# Patient Record
Sex: Female | Born: 1980 | Race: White | Hispanic: No | Smoking: Former smoker
Health system: Southern US, Community
[De-identification: ages and names within clinical notes are randomized; demographics above are authoritative.]

## PROBLEM LIST (undated history)

## (undated) ENCOUNTER — Inpatient Hospital Stay (HOSPITAL_COMMUNITY): Payer: Self-pay

## (undated) DIAGNOSIS — Z87442 Personal history of urinary calculi: Secondary | ICD-10-CM

## (undated) DIAGNOSIS — F909 Attention-deficit hyperactivity disorder, unspecified type: Secondary | ICD-10-CM

## (undated) DIAGNOSIS — M1990 Unspecified osteoarthritis, unspecified site: Secondary | ICD-10-CM

## (undated) DIAGNOSIS — I10 Essential (primary) hypertension: Secondary | ICD-10-CM

## (undated) DIAGNOSIS — F32A Depression, unspecified: Secondary | ICD-10-CM

## (undated) DIAGNOSIS — O009 Unspecified ectopic pregnancy without intrauterine pregnancy: Secondary | ICD-10-CM

## (undated) HISTORY — DX: Unspecified ectopic pregnancy without intrauterine pregnancy: O00.90

## (undated) HISTORY — PX: WISDOM TOOTH EXTRACTION: SHX21

---

## 2003-07-07 ENCOUNTER — Other Ambulatory Visit: Admission: RE | Admit: 2003-07-07 | Discharge: 2003-07-07 | Payer: Self-pay | Admitting: Obstetrics and Gynecology

## 2003-09-30 ENCOUNTER — Emergency Department (HOSPITAL_COMMUNITY): Admission: EM | Admit: 2003-09-30 | Discharge: 2003-09-30 | Payer: Self-pay | Admitting: Family Medicine

## 2005-01-18 ENCOUNTER — Emergency Department (HOSPITAL_COMMUNITY): Admission: EM | Admit: 2005-01-18 | Discharge: 2005-01-18 | Payer: Self-pay | Admitting: Emergency Medicine

## 2007-11-25 ENCOUNTER — Other Ambulatory Visit: Admission: RE | Admit: 2007-11-25 | Discharge: 2007-11-25 | Payer: Self-pay | Admitting: Obstetrics & Gynecology

## 2011-06-20 ENCOUNTER — Ambulatory Visit (INDEPENDENT_AMBULATORY_CARE_PROVIDER_SITE_OTHER): Payer: BC Managed Care – PPO

## 2011-06-20 DIAGNOSIS — R030 Elevated blood-pressure reading, without diagnosis of hypertension: Secondary | ICD-10-CM

## 2011-06-20 DIAGNOSIS — R319 Hematuria, unspecified: Secondary | ICD-10-CM

## 2011-06-20 DIAGNOSIS — R1032 Left lower quadrant pain: Secondary | ICD-10-CM

## 2011-06-20 DIAGNOSIS — R1031 Right lower quadrant pain: Secondary | ICD-10-CM

## 2011-06-25 ENCOUNTER — Ambulatory Visit (INDEPENDENT_AMBULATORY_CARE_PROVIDER_SITE_OTHER): Payer: BC Managed Care – PPO

## 2011-06-25 DIAGNOSIS — R319 Hematuria, unspecified: Secondary | ICD-10-CM

## 2011-06-25 DIAGNOSIS — R030 Elevated blood-pressure reading, without diagnosis of hypertension: Secondary | ICD-10-CM

## 2011-06-25 DIAGNOSIS — B373 Candidiasis of vulva and vagina: Secondary | ICD-10-CM

## 2014-11-19 ENCOUNTER — Encounter (HOSPITAL_COMMUNITY): Payer: Self-pay

## 2014-11-19 ENCOUNTER — Inpatient Hospital Stay (HOSPITAL_COMMUNITY)
Admission: AD | Admit: 2014-11-19 | Discharge: 2014-11-19 | Disposition: A | Discharge status: Home / Self Care | Payer: BLUE CROSS/BLUE SHIELD | Source: Ambulatory Visit | Attending: Obstetrics and Gynecology | Admitting: Obstetrics and Gynecology

## 2014-11-19 DIAGNOSIS — N9489 Other specified conditions associated with female genital organs and menstrual cycle: Secondary | ICD-10-CM | POA: Insufficient documentation

## 2014-11-19 DIAGNOSIS — R1031 Right lower quadrant pain: Secondary | ICD-10-CM | POA: Diagnosis not present

## 2014-11-19 DIAGNOSIS — R1030 Lower abdominal pain, unspecified: Secondary | ICD-10-CM | POA: Diagnosis present

## 2014-11-19 HISTORY — DX: Attention-deficit hyperactivity disorder, unspecified type: F90.9

## 2014-11-19 LAB — CBC WITH DIFFERENTIAL/PLATELET
BASOS ABS: 0.1 10*3/uL (ref 0.0–0.1)
Basophils Relative: 1 % (ref 0–1)
Eosinophils Absolute: 0.1 10*3/uL (ref 0.0–0.7)
Eosinophils Relative: 1 % (ref 0–5)
HCT: 42.3 % (ref 36.0–46.0)
Hemoglobin: 14.4 g/dL (ref 12.0–15.0)
LYMPHS ABS: 1.5 10*3/uL (ref 0.7–4.0)
LYMPHS PCT: 12 % (ref 12–46)
MCH: 31.6 pg (ref 26.0–34.0)
MCHC: 34 g/dL (ref 30.0–36.0)
MCV: 92.8 fL (ref 78.0–100.0)
MONO ABS: 0.6 10*3/uL (ref 0.1–1.0)
MONOS PCT: 5 % (ref 3–12)
NEUTROS PCT: 81 % — AB (ref 43–77)
Neutro Abs: 9.6 10*3/uL — ABNORMAL HIGH (ref 1.7–7.7)
PLATELETS: 371 10*3/uL (ref 150–400)
RBC: 4.56 MIL/uL (ref 3.87–5.11)
RDW: 12.4 % (ref 11.5–15.5)
WBC: 11.8 10*3/uL — ABNORMAL HIGH (ref 4.0–10.5)

## 2014-11-19 LAB — ABO/RH: ABO/RH(D): B POS

## 2014-11-19 LAB — CREATININE, SERUM: CREATININE: 0.6 mg/dL (ref 0.44–1.00)

## 2014-11-19 LAB — TYPE AND SCREEN
ABO/RH(D): B POS
ANTIBODY SCREEN: NEGATIVE

## 2014-11-19 LAB — HCG, QUANTITATIVE, PREGNANCY: hCG, Beta Chain, Quant, S: 7573 m[IU]/mL — ABNORMAL HIGH (ref <5)

## 2014-11-19 LAB — AST: AST: 15 U/L (ref 15–41)

## 2014-11-19 LAB — BUN: BUN: 7 mg/dL (ref 6–20)

## 2014-11-19 MED ORDER — OXYCODONE-ACETAMINOPHEN 5-325 MG PO TABS: 1.0000 | ORAL_TABLET | ORAL | Status: DC | PRN

## 2014-11-19 MED ORDER — KETOROLAC TROMETHAMINE 30 MG/ML IJ SOLN
30.0000 mg | Freq: Once | INTRAMUSCULAR | Status: AC
  Administered 2014-11-19: 30 mg via INTRAMUSCULAR
  Filled 2014-11-19: qty 1

## 2014-11-19 MED ORDER — METHOTREXATE INJECTION FOR WOMEN'S HOSPITAL
50.0000 mg/m2 | Freq: Once | INTRAMUSCULAR | Status: AC
Start: 2014-11-19 — End: 2014-11-19
  Administered 2014-11-19: 95 mg via INTRAMUSCULAR
  Filled 2014-11-19: qty 1.9

## 2014-11-19 NOTE — Discharge Instructions (Signed)
Methotrexate injection What is this medicine? METHOTREXATE (METH oh TREX ate) is a chemotherapy drug. This medicine affects cells that are rapidly growing, such as cancer cells and cells in your mouth and stomach. It is used to treat many cancers and other medical conditions. It is used for leukemias, lymphomas, breast cancer, lung cancer, head and neck cancers, and other cancers. This medicine also works on the immune system and is commonly used to treat psoriasis and rheumatoid arthritis. This medicine may be used for other purposes; ask your health care provider or pharmacist if you have questions. What should I tell my health care provider before I take this medicine? They need to know if you have any of these conditions: -if you frequently drink alcohol containing drinks -infection (especially a virus infection such as chickenpox, cold sores, or herpes) -immune system problems -kidney disease -liver disease -low blood counts, like platelets, red bloods, or white blood cells -lung disease -recent or ongoing radiation therapy -an unusual or allergic reaction to methotrexate, benzyl alcohol, other medicines, foods, dyes, or preservatives -pregnant or trying to get pregnant -breast-feeding How should I use this medicine? This drug is given as an injection into a muscle or into a vein. It may also be given into the spinal fluid. It is administered in a hospital or clinic by a specially trained health care professional. Talk to your pediatrician regarding the use of this medicine in children. While this drug may be prescribed for selected conditions, precautions do apply. Overdosage: If you think you have taken too much of this medicine contact a poison control center or emergency room at once. NOTE: This medicine is only for you. Do not share this medicine with others. What if I miss a dose? It is important not to miss your dose. Call your doctor or health care professional if you are unable  to keep an appointment. What may interact with this medicine? -antibiotics and other medicines for infections -aspirin and aspirin-like medicines including bismuth subsalicylate (Pepto-Bismol) -cisplatin -dapsone -folic acid in supplements or vitamins -mercaptopurine -NSAIDs, medicines for pain and inflammation, like ibuprofen or naproxen -pemetrexed -phenylbutazone -phenytoin -probenecid -pyrimethamine -theophylline -trimetrexate -vaccines This list may not describe all possible interactions. Give your health care provider a list of all the medicines, herbs, non-prescription drugs, or dietary supplements you use. Also tell them if you smoke, drink alcohol, or use illegal drugs. Some items may interact with your medicine. What should I watch for while using this medicine? Visit your doctor for checks on your progress. You will need to have regular blood checks during your treatment to monitor your blood, liver function, and kidney function. This drug may make you feel generally unwell. This is not uncommon, as chemotherapy can affect healthy cells as well as cancer cells. Report any side effects. Continue your course of treatment even though you feel ill unless your doctor tells you to stop. In some cases, you may be given additional medicines to help with side effects. Follow all directions for their use. Call your doctor or health care professional for advice if you get a fever, chills or sore throat, or other symptoms of a cold or flu. Do not treat yourself. This drug decreases your body's ability to fight infections. Try to avoid being around people who are sick. This medicine may increase your risk to bruise or bleed. Call your doctor or health care professional if you notice any unusual bleeding. Be careful brushing and flossing your teeth or using a toothpick because   you may get an infection or bleed more easily. If you have any dental work done, tell your dentist you are receiving  this medicine. Avoid taking products that contain aspirin, acetaminophen, ibuprofen, naproxen, or ketoprofen unless instructed by your doctor. These medicines may hide a fever. This medicine can make you more sensitive to the sun. Keep out of the sun. If you cannot avoid being in the sun, wear protective clothing and use sunscreen. Do not use sun lamps or tanning beds/booths. Do not treat diarrhea with over the counter products. Contact your doctor if you have diarrhea. To protect your kidneys, drink water or other fluids as directed while you are taking this medicine. Do not drink alcohol-containing drinks while taking this medicine. Both alcohol and the medicine may cause damage to your liver. Men and women must use effective birth control while they are taking this medicine. Do not become pregnant while taking this medicine. Women must continue using effective birth control for 1 full menstrual cycle after stopping this medicine. Tell your doctor right away if you think that you or your partner might be pregnant. There is a potential for serious side effects to an unborn child. Talk to your health care professional or pharmacist for more information. Do not breast-feed an infant while taking this medicine. Men must continue effective birth control for 3 months after stopping this medicine. What side effects may I notice from receiving this medicine? Side effects that you should report to your doctor or health care professional as soon as possible: -allergic reactions like skin rash, itching or hives, swelling of the face, lips, or tongue -low blood counts - this medicine may decrease the number of white blood cells, red blood cells and platelets. You may be at increased risk for infections and bleeding. -signs of infection - fever or chills, cough, sore throat, pain or difficulty passing urine -signs of decreased platelets or bleeding - bruising, pinpoint red spots on the skin, black, tarry stools,  blood in the urine -signs of decreased red blood cells - unusually weak or tired, fainting spells, lightheadedness -breathing problems, like a dry cough -changes in vision -confusion, not alert -diarrhea -mouth or throat sores or ulcers -problems with balance, talking, walking -redness, blistering, peeling or loosening of the skin, including inside the mouth -seizures -trouble passing urine or change in the amount of urine -vomiting -yellowing of the eyes or skin Side effects that usually do not require medical attention (report to your doctor or health care professional if they continue or are bothersome): -change in skin color -eye irritation -hair loss -headache -loss of appetite -nausea -stomach upset This list may not describe all possible side effects. Call your doctor for medical advice about side effects. You may report side effects to FDA at 1-800-FDA-1088. Where should I keep my medicine? This drug is given in a hospital or clinic and will not be stored at home. NOTE: This sheet is a summary. It may not cover all possible information. If you have questions about this medicine, talk to your doctor, pharmacist, or health care provider.  2015, Elsevier/Gold Standard. (2007-12-11 11:13:24)  

## 2014-11-19 NOTE — H&P (Signed)
NAMCherene Altes:  [name], [name]             ACCOUNT NO.:  [id number]  MEDICAL RECORD NO.:  [id number]  LOCATION:  [id number]                          FACILITY:  [facility]  PHYSICIAN:  Lenoard Adenichard J. Jakyiah Briones, MelissaD.DATE OF BIRTH:  1980-12-24  DATE OF ADMISSION:  11/19/2014 DATE OF DISCHARGE:                             HISTORY & PHYSICAL   CHIEF COMPLAINT:  Lower abdominal pain.  HISTORY OF PRESENT ILLNESS:  She is a 34 year old white female, G1, P0, who presented to the office today with acute onset of bleeding and mid- to-right lower quadrant pain for evaluation.  It was noted that her bleeding was minimal.  She had an ultrasound suggesting an empty endometrial cavity and a 2 cm saccular structure on the right adnexa. No free fluid was seen.  Otherwise, normal left adnexa was noted.  She presents now to the emergency room for additional evaluation and triage.  PAST MEDICAL HISTORY:  Remarkable for wisdom tooth removal.  ALLERGIES:  She has no known drug allergies.  MEDICATIONS:  Prenatal vitamins, Phenergan as needed, Zofran as needed.  SOCIAL HISTORY:  She is a nonsmoker, nondrinker.  She denies domestic or physical violence.  Social history, noncontributory.  FAMILY HISTORY:  Noncontributory.  PHYSICAL EXAMINATION:  GENERAL:  She is a well-developed, well-nourished white female. VITAL SIGNS:  Initial blood pressure 155/95, pulse of 89, and respiration rate of 20. HEENT:  Normal. NECK:  Supple.  Full range of motion. LUNGS:  Clear. HEART:  Regular rate and rhythm. ABDOMEN:  Soft, nontender.  No rebound.  No guarding. PELVIC:  Reveals minimal bleeding.  No cervical motion tenderness. EXTREMITIES:  There are no cords. NEUROLOGIC:  Nonfocal. SKIN:  Intact.  LABORATORY VALUES:  Include a CBC which is remarkable for white blood cell count of 11.8, hemoglobin of 14.4, and a normal complete metabolic profile.  She had a quantitative HCG of 7573.  IMPRESSION:  Mid right lower quadrant pain with 2 cm  right adnexal mass and quantitative HCG of 7573.  I have expressed to the patient and her husband the possibility of a miscarriage that could have occurred with continued elevated quant before dropping; however, in lieu of the fact of these suspicious findings on ultrasound in conjunction with this elevated quantitative HCG, I have recommended methotrexate treatment at this time.  PLAN:  To proceed with methotrexate IM, bleeding precautions given.  She is to follow up in the office for serial HCG followup.  Consents were done.  Percocet prescription given.     Lenoard Adenichard J. Geraldyn Shain, MelissaD.     RJT/MEDQ  D:  11/19/2014  T:  11/19/2014  Job:  [id number]

## 2014-11-19 NOTE — MAU Note (Signed)
Pt sent from MD office for r ectopic. Pain on r side.

## 2014-11-19 NOTE — Progress Notes (Signed)
Returned page. Will give methotrexate and come see pt. Requested that physician put in methotrexate order.

## 2014-11-19 NOTE — Progress Notes (Signed)
Paged to request pain medication for pt pain 9/10

## 2014-11-19 NOTE — Progress Notes (Signed)
Paged to notify of hcg

## 2014-12-31 ENCOUNTER — Other Ambulatory Visit (HOSPITAL_COMMUNITY): Payer: Self-pay | Admitting: Obstetrics and Gynecology

## 2015-01-20 ENCOUNTER — Other Ambulatory Visit (HOSPITAL_COMMUNITY): Payer: Self-pay | Admitting: Obstetrics and Gynecology

## 2015-01-20 DIAGNOSIS — O009 Unspecified ectopic pregnancy without intrauterine pregnancy: Secondary | ICD-10-CM

## 2015-01-27 ENCOUNTER — Ambulatory Visit (HOSPITAL_COMMUNITY)
Admission: RE | Admit: 2015-01-27 | Discharge: 2015-01-27 | Disposition: A | Discharge status: Home / Self Care | Payer: BLUE CROSS/BLUE SHIELD | Source: Ambulatory Visit | Attending: Obstetrics and Gynecology | Admitting: Obstetrics and Gynecology

## 2015-01-27 ENCOUNTER — Encounter (HOSPITAL_COMMUNITY): Payer: Self-pay

## 2015-01-27 DIAGNOSIS — O009 Unspecified ectopic pregnancy without intrauterine pregnancy: Secondary | ICD-10-CM

## 2015-01-27 DIAGNOSIS — Z8759 Personal history of other complications of pregnancy, childbirth and the puerperium: Secondary | ICD-10-CM | POA: Insufficient documentation

## 2015-01-27 MED ORDER — IOHEXOL 300 MG/ML  SOLN
30.0000 mL | Freq: Once | INTRAMUSCULAR | Status: DC | PRN
  Administered 2015-01-27: 30 mL
  Filled 2015-01-27: qty 30

## 2015-04-25 ENCOUNTER — Ambulatory Visit (INDEPENDENT_AMBULATORY_CARE_PROVIDER_SITE_OTHER): Payer: BLUE CROSS/BLUE SHIELD | Admitting: Physician Assistant

## 2015-04-25 VITALS — BP 120/74 | HR 57 | Temp 98.0°F | Resp 16 | Ht 64.5 in | Wt 176.8 lb

## 2015-04-25 DIAGNOSIS — R05 Cough: Secondary | ICD-10-CM

## 2015-04-25 DIAGNOSIS — J0140 Acute pansinusitis, unspecified: Secondary | ICD-10-CM

## 2015-04-25 DIAGNOSIS — R058 Other specified cough: Secondary | ICD-10-CM

## 2015-04-25 MED ORDER — GUAIFENESIN ER 1200 MG PO TB12: 1.0000 | ORAL_TABLET | Freq: Two times a day (BID) | ORAL | Status: DC | PRN

## 2015-04-25 MED ORDER — DOXYCYCLINE HYCLATE 100 MG PO CAPS: 100.0000 mg | ORAL_CAPSULE | Freq: Two times a day (BID) | ORAL | Status: AC

## 2015-04-25 MED ORDER — HYDROCOD POLST-CPM POLST ER 10-8 MG/5ML PO SUER: 5.0000 mL | Freq: Every evening | ORAL | Status: AC | PRN

## 2015-04-25 NOTE — Progress Notes (Signed)
[facility] [address], [place] [address] [phone]  Date:  04/25/2015   Name:  [name] [name]   DOB:  Jan 08, 1981   MRN:  [id number]  PCP:  No primary care provider on file.   Chief Complaint  Patient presents with  . Laryngitis    x 3 days  . Nasal Congestion    Post nasal drip  . Sore Throat  . Headache    Sinus headaches x 1 week  . Cough    Productive, Yellow Color, x 2 days, Interrupts pt's sleep     History of Present Illness:  [name] [name] is a 34 y.o. female patient who presents to [facility] with cc of sinus pressure, productive cough, and loss of voice with initial symptoms beginning 1 week ago.  Patient notes that she has had nasal congestion and sinus pressure at her forehead for the last week.  She can feel the drainage from her nose to the back of her throat.  Over the last 3 days, she has lost her voice, and has sore throat.  She also has productive cough over the last 2 days, that worsens at night.  It is a productive yellow sputum.   She hydrates very little.  She has no fever, sob, or dyspnea.  She is currently using a nasal spray.   There are no active problems to display for this patient.   Past Medical History  Diagnosis Date  . ADHD (attention deficit hyperactivity disorder)   . History of tubal ligation     Past Surgical History  Procedure Laterality Date  . Wisdom tooth extraction      Social History  Substance Use Topics  . Smoking status: Former Games developer  . Smokeless tobacco: Never Used  . Alcohol Use: No    Family History  Problem Relation Age of Onset  . Hypertension Father     Allergies  Allergen Reactions  . Toradol [Ketorolac Tromethamine] Nausea And Vomiting    Medication list has been reviewed and updated.  Current Outpatient Prescriptions on File Prior to Visit  Medication Sig Dispense Refill  . oxyCODONE-acetaminophen (ROXICET) 5-325 MG per tablet Take 1-2 tablets by mouth every 4 (four) hours as  needed for severe pain. (Patient not taking: Reported on 04/25/2015) 30 tablet 0   No current facility-administered medications on file prior to visit.    ROS ROS otehrwise unremarkble unelss listed above.   Physical Examination: BP 120/74 mmHg  Pulse 57  Temp(Src) 98 F (36.7 C) (Oral)  Resp 16  Ht 5' 4.5" (1.638 m)  Wt 176 lb 12.8 oz (80.196 kg)  BMI 29.89 kg/m2  SpO2 96%  LMP 04/11/2015 Ideal Body Weight: Weight in (lb) to have BMI = 25: 147.6  Physical Exam  Constitutional: She is oriented to person, place, and time. She appears well-developed and well-nourished. No distress.  HENT:  Head: Normocephalic and atraumatic.  Right Ear: Tympanic membrane, external ear and ear canal normal.  Left Ear: Tympanic membrane, external ear and ear canal normal.  Nose: Rhinorrhea present. No mucosal edema. Right sinus exhibits maxillary sinus tenderness and frontal sinus tenderness. Left sinus exhibits maxillary sinus tenderness and frontal sinus tenderness.  Mouth/Throat: No uvula swelling. Posterior oropharyngeal edema present. No oropharyngeal exudate or posterior oropharyngeal erythema.  Eyes: Conjunctivae and EOM are normal. Pupils are equal, round, and reactive to light.  Cardiovascular: Normal rate and regular rhythm.  Exam reveals no gallop and no friction rub.   No  murmur heard. Pulmonary/Chest: Effort normal. No respiratory distress. She has no decreased breath sounds. She has no wheezes. She has no rhonchi.  Neurological: She is alert and oriented to person, place, and time.  Skin: Skin is warm and dry. She is not diaphoretic.  Psychiatric: She has a normal mood and affect. Her behavior is normal.     Assessment and Plan: [name] [name] is a 34 y.o. female who is here today for cc of sinus pressure, productive cough, loss of voice, and sore throat over the last week without improvement.  Will treat for bacterial etiology, as well as productive cough.  Doxycycline to cover for  other sinus cavities.   Treatment plan discussed.   Subacute pansinusitis - Plan: doxycycline (VIBRAMYCIN) 100 MG capsule, Guaifenesin (MUCINEX MAXIMUM STRENGTH) 1200 MG TB12, chlorpheniramine-HYDROcodone (TUSSIONEX PENNKINETIC ER) 10-8 MG/5ML SUER  Productive cough - Plan: doxycycline (VIBRAMYCIN) 100 MG capsule, Guaifenesin (MUCINEX MAXIMUM STRENGTH) 1200 MG TB12, chlorpheniramine-HYDROcodone (TUSSIONEX PENNKINETIC ER) 10-8 MG/5ML SUER    [name] PlattStephanie Mirren Gest, PA-C [facility] 04/25/2015 6:34 PM

## 2015-04-25 NOTE — Patient Instructions (Signed)
Please hydrate well with 64 oz per day which is almost 4 regular sized water bottles.    Sinusitis, Adult Sinusitis is redness, soreness, and inflammation of the paranasal sinuses. Paranasal sinuses are air pockets within the bones of your face. They are located beneath your eyes, in the middle of your forehead, and above your eyes. In healthy paranasal sinuses, mucus is able to drain out, and air is able to circulate through them by way of your nose. However, when your paranasal sinuses are inflamed, mucus and air can become trapped. This can allow bacteria and other germs to grow and cause infection. Sinusitis can develop quickly and last only a short time (acute) or continue over a long period (chronic). Sinusitis that lasts for more than 12 weeks is considered chronic. CAUSES Causes of sinusitis include:  Allergies.  Structural abnormalities, such as displacement of the cartilage that separates your nostrils (deviated septum), which can decrease the air flow through your nose and sinuses and affect sinus drainage.  Functional abnormalities, such as when the small hairs (cilia) that line your sinuses and help remove mucus do not work properly or are not present. SIGNS AND SYMPTOMS Symptoms of acute and chronic sinusitis are the same. The primary symptoms are pain and pressure around the affected sinuses. Other symptoms include:  Upper toothache.  Earache.  Headache.  Bad breath.  Decreased sense of smell and taste.  A cough, which worsens when you are lying flat.  Fatigue.  Fever.  Thick drainage from your nose, which often is green and may contain pus (purulent).  Swelling and warmth over the affected sinuses. DIAGNOSIS Your health care provider will perform a physical exam. During your exam, your health care provider may perform any of the following to help determine if you have acute sinusitis or chronic sinusitis:  Look in your nose for signs of abnormal growths in your  nostrils (nasal polyps).  Tap over the affected sinus to check for signs of infection.  View the inside of your sinuses using an imaging device that has a light attached (endoscope). If your health care provider suspects that you have chronic sinusitis, one or more of the following tests may be recommended:  Allergy tests.  Nasal culture. A sample of mucus is taken from your nose, sent to a lab, and screened for bacteria.  Nasal cytology. A sample of mucus is taken from your nose and examined by your health care provider to determine if your sinusitis is related to an allergy. TREATMENT Most cases of acute sinusitis are related to a viral infection and will resolve on their own within 10 days. Sometimes, medicines are prescribed to help relieve symptoms of both acute and chronic sinusitis. These may include pain medicines, decongestants, nasal steroid sprays, or saline sprays. However, for sinusitis related to a bacterial infection, your health care provider will prescribe antibiotic medicines. These are medicines that will help kill the bacteria causing the infection. Rarely, sinusitis is caused by a fungal infection. In these cases, your health care provider will prescribe antifungal medicine. For some cases of chronic sinusitis, surgery is needed. Generally, these are cases in which sinusitis recurs more than 3 times per year, despite other treatments. HOME CARE INSTRUCTIONS  Drink plenty of water. Water helps thin the mucus so your sinuses can drain more easily.  Use a humidifier.  Inhale steam 3-4 times a day (for example, sit in the bathroom with the shower running).  Apply a warm, moist washcloth to your face  3-4 times a day, or as directed by your health care provider.  Use saline nasal sprays to help moisten and clean your sinuses.  Take medicines only as directed by your health care provider.  If you were prescribed either an antibiotic or antifungal medicine, finish it all  even if you start to feel better. SEEK IMMEDIATE MEDICAL CARE IF:  You have increasing pain or severe headaches.  You have nausea, vomiting, or drowsiness.  You have swelling around your face.  You have vision problems.  You have a stiff neck.  You have difficulty breathing.   This information is not intended to replace advice given to you by your health care provider. Make sure you discuss any questions you have with your health care provider.   Document Released: 06/04/2005 Document Revised: 06/25/2014 Document Reviewed: 06/19/2011 Elsevier Interactive Patient Education [employer]

## 2015-04-28 ENCOUNTER — Encounter: Payer: Self-pay | Admitting: Physician Assistant

## 2015-06-22 ENCOUNTER — Ambulatory Visit (INDEPENDENT_AMBULATORY_CARE_PROVIDER_SITE_OTHER): Payer: BLUE CROSS/BLUE SHIELD

## 2015-06-22 ENCOUNTER — Ambulatory Visit (INDEPENDENT_AMBULATORY_CARE_PROVIDER_SITE_OTHER): Payer: BLUE CROSS/BLUE SHIELD | Admitting: Physician Assistant

## 2015-06-22 VITALS — BP 122/70 | HR 109 | Temp 98.3°F | Resp 17 | Ht 65.0 in | Wt 182.0 lb

## 2015-06-22 DIAGNOSIS — M542 Cervicalgia: Secondary | ICD-10-CM

## 2015-06-22 DIAGNOSIS — F909 Attention-deficit hyperactivity disorder, unspecified type: Secondary | ICD-10-CM | POA: Insufficient documentation

## 2015-06-22 DIAGNOSIS — M25511 Pain in right shoulder: Secondary | ICD-10-CM | POA: Diagnosis not present

## 2015-06-22 DIAGNOSIS — R071 Chest pain on breathing: Secondary | ICD-10-CM | POA: Diagnosis not present

## 2015-06-22 DIAGNOSIS — R0789 Other chest pain: Secondary | ICD-10-CM

## 2015-06-22 MED ORDER — MELOXICAM 15 MG PO TABS: 15.0000 mg | ORAL_TABLET | Freq: Every day | ORAL | Status: DC

## 2015-06-22 MED ORDER — CYCLOBENZAPRINE HCL 10 MG PO TABS: 10.0000 mg | ORAL_TABLET | Freq: Three times a day (TID) | ORAL | Status: DC | PRN

## 2015-06-22 MED ORDER — HYDROCODONE-ACETAMINOPHEN 5-325 MG PO TABS: 1.0000 | ORAL_TABLET | Freq: Four times a day (QID) | ORAL | Status: DC | PRN

## 2015-06-22 NOTE — Progress Notes (Signed)
Patient ID: [name] [name], female    DOB: [birth year], 35 y.o.   MRN: [id number]  PCP: No primary care provider on file.  Subjective:   Chief Complaint  Patient presents with  . Neck Pain    fell out of bed two weeks ago   . Shoulder Pain    fell out of bed two weeks ago   . Nasal Congestion    HPI Presents for evaluation of neck, shoulder and upper chest wall pain x 2 weeks, after falling out of bed.  Thinks she was having a bad dream, fell out of bed. Husband heard her land on the floor.  Pain down the RIGHT side of the neck into the shoulder and RIGHT upper chest. Feels knots in the muscles around the shoulder blade. Gets spasm with some movements. Numbness in the tips of the index and middle fingers on the RIGHT. Ibuprofen 600-800 mg Q4-6 hours helps. Biofreeze, can't use it at work due to odor. No headache or dizziness. No dropping, loss of grasp. Has been able to work as a Photographer, doing heavy lifting, but at the end of a shift, her pain is worse. More sore in the mornings and at night. Heat doesn't seem to help. Stopped that 2 days ago.  She tried to schedule her usual monthly massage a little early, hoping that would help, and the therapist advised her to be seen.  In addition, has picked up a cold. Began yesterday. Burning in the sinuses, itchy throat and eyes, sneezing a lot. No ear pain. Maybe ears are a little stopped up. Not coughing. No fever/chills. No unexplained muscle or joint aches.     Review of Systems As above. No headache, SOB, dizziness.    Patient Active Problem List   Diagnosis Date Noted  . Attention deficit hyperactivity disorder (ADHD) 06/22/2015     Prior to Admission medications   Medication Sig Start Date End Date Taking? Authorizing Provider  amphetamine-dextroamphetamine (ADDERALL XR) 25 MG 24 hr capsule Take 25 mg by mouth every morning.   Yes Historical Provider, MD  amphetamine-dextroamphetamine  (ADDERALL XR) 30 MG 24 hr capsule Take 30 mg by mouth daily.   Yes Historical Provider, MD  Phenylephrine-DM-GG-APAP (MUCINEX FAST-MAX SEVERE COLD) 5-10-200-325 MG/10ML LIQD Take by mouth.   Yes Historical Provider, MD     Allergies  Allergen Reactions  . Toradol [Ketorolac Tromethamine] Nausea And Vomiting       Objective:  Physical Exam  Constitutional: She is oriented to person, place, and time. Vital signs are normal. She appears well-developed and well-nourished. She is active and cooperative. No distress.  BP 122/70 mmHg  Pulse 109  Temp(Src) 98.3 F (36.8 C) (Oral)  Resp 17  Ht 5\' 5"  (1.651 m)  Wt 182 lb (82.555 kg)  BMI 30.29 kg/m2  SpO2 98%  LMP 06/06/2015  HENT:  Head: Normocephalic and atraumatic.  Right Ear: Hearing normal.  Left Ear: Hearing normal.  Eyes: Conjunctivae are normal. No scleral icterus.  Neck: Normal range of motion. Neck supple. No thyromegaly present.  Cardiovascular: Normal rate, regular rhythm and normal heart sounds.   Pulses:      Radial pulses are 2+ on the right side, and 2+ on the left side.    Pulmonary/Chest: Effort normal and breath sounds normal.    Musculoskeletal:       Right shoulder: She exhibits tenderness and pain. She exhibits normal range of motion, no bony tenderness, no swelling, no deformity, no laceration and  no spasm.       Right elbow: Normal.      Right wrist: Normal.       Cervical back: She exhibits decreased range of motion, tenderness, pain and spasm. She exhibits no bony tenderness, no swelling, no edema, no deformity and no laceration.       Right upper arm: Normal.       Right forearm: Normal.       Right hand: She exhibits normal capillary refill. Normal sensation noted. Normal strength noted.  Very erect posture. Moves upper body cautiously with minimal movement of the neck and shoulders.  Lymphadenopathy:       Head (right side): No tonsillar, no preauricular, no posterior auricular and no occipital  adenopathy present.       Head (left side): No tonsillar, no preauricular, no posterior auricular and no occipital adenopathy present.    She has no cervical adenopathy.       Right: No supraclavicular adenopathy present.       Left: No supraclavicular adenopathy present.  Neurological: She is alert and oriented to person, place, and time. No sensory deficit.  Skin: Skin is warm, dry and intact. No rash noted. No cyanosis or erythema. Nails show no clubbing.  Psychiatric: She has a normal mood and affect.      C-Spine: [facility] reading (PRIMARY) by  Dr. Dareen PianoAnderson. Loss of lordosis with step offs, worst at C4-C5. STAT Overread: Reversal lordotic body curvature, a finding most likely indicative of muscle spasm. No fracture or spondylolisthesis. No appreciable arthropathic change.     Assessment & Plan:   1. Neck pain 2. Pain in joint of right shoulder 3. Anterior chest wall pain Contusion/strain, now with considerable muscle spasm. Anticipatory guidance. Encouraged re-attempt at moist heat. OK for massage. RTC if symptoms worsen/persist. - DG Cervical Spine Complete; Future - meloxicam (MOBIC) 15 MG tablet; Take 1 tablet (15 mg total) by mouth daily.  Dispense: 30 tablet; Refill: 1 - HYDROcodone-acetaminophen (NORCO) 5-325 MG tablet; Take 1 tablet by mouth every 6 (six) hours as needed.  Dispense: 30 tablet; Refill: 0 - cyclobenzaprine (FLEXERIL) 10 MG tablet; Take 1 tablet (10 mg total) by mouth 3 (three) times daily as needed for muscle spasms.  Dispense: 30 tablet; Refill: 0    Fernande Brashelle S. Debbi Strandberg, PA-C Physician Assistant-Certified [facility]

## 2015-06-22 NOTE — Patient Instructions (Signed)
It is OK to have a massage.

## 2015-06-24 ENCOUNTER — Telehealth: Payer: Self-pay

## 2015-06-24 NOTE — Telephone Encounter (Signed)
Patient was advised to call if the the antianflamatory medication prescribed does not work. Patient states that it is not helping and would like to try something else.  Also, patient states she was seen for a cold during that same visit and was not prescribed any medication for it [phone]

## 2015-06-24 NOTE — Telephone Encounter (Signed)
Assessment & Plan:   1. Neck pain 2. Pain in joint of right shoulder 3. Anterior chest wall pain Contusion/strain, now with considerable muscle spasm. Anticipatory guidance. Encouraged re-attempt at moist heat. OK for massage. RTC if symptoms worsen/persist. - DG Cervical Spine Complete; Future - meloxicam (MOBIC) 15 MG tablet; Take 1 tablet (15 mg total) by mouth daily. Dispense: 30 tablet; Refill: 1 - HYDROcodone-acetaminophen (NORCO) 5-325 MG tablet; Take 1 tablet by mouth every 6 (six) hours as needed. Dispense: 30 tablet; Refill: 0 - cyclobenzaprine (FLEXERIL) 10 MG tablet; Take 1 tablet (10 mg total) by mouth 3 (three) times daily as needed for muscle spasms. Dispense: 30 tablet; Refill: 0        No discussion about a cold.

## 2015-06-26 ENCOUNTER — Encounter: Payer: Self-pay | Admitting: Physician Assistant

## 2015-06-27 ENCOUNTER — Encounter: Payer: Self-pay | Admitting: Physician Assistant

## 2015-06-27 DIAGNOSIS — M542 Cervicalgia: Secondary | ICD-10-CM

## 2015-06-27 MED ORDER — IPRATROPIUM BROMIDE 0.03 % NA SOLN: 2.0000 | Freq: Two times a day (BID) | NASAL | Status: DC

## 2015-06-27 MED ORDER — DIAZEPAM 5 MG PO TABS: 5.0000 mg | ORAL_TABLET | Freq: Three times a day (TID) | ORAL | Status: DC | PRN

## 2015-06-27 MED ORDER — GUAIFENESIN ER 1200 MG PO TB12: 1.0000 | ORAL_TABLET | Freq: Two times a day (BID) | ORAL | Status: DC | PRN

## 2015-06-27 MED ORDER — PREDNISONE 20 MG PO TABS: ORAL_TABLET | ORAL | Status: DC

## 2015-06-27 NOTE — Telephone Encounter (Signed)
Meds ordered this encounter  Medications  . diazepam (VALIUM) 5 MG tablet    Sig: Take 1-2 tablets (5-10 mg total) by mouth every 8 (eight) hours as needed for muscle spasms.    Dispense:  20 tablet    Refill:  0    Order Specific Question:  Supervising Provider    Answer:  Tonye PearsonOLITTLE, ROBERT P [[id number]]   Patient notified via My Chart.

## 2015-06-27 NOTE — Addendum Note (Signed)
Addended by: Carmelina DaneANDERSON, Janeil Schexnayder S on: 06/27/2015 04:22 PM   Modules accepted: Kipp BroodSmartSet

## 2015-06-27 NOTE — Progress Notes (Signed)
  Medical screening examination/treatment/procedure(s) were performed by non-physician practitioner and as supervising physician I was immediately available for consultation/collaboration.     

## 2015-06-27 NOTE — Telephone Encounter (Signed)
Hold the meloxicam while on the prednisone. May continue the cyclobenzaprine and hydrocodone if needed.  Meds ordered this encounter  Medications  . predniSONE (DELTASONE) 20 MG tablet    Sig: Take 3 PO QAM x3days, 2 PO QAM x3days, 1 PO QAM x3days    Dispense:  18 tablet    Refill:  0    Order Specific Question:  Supervising Provider    Answer:  DOOLITTLE, ROBERT P [[id number]]  . ipratropium (ATROVENT) 0.03 % nasal spray    Sig: Place 2 sprays into both nostrils 2 (two) times daily.    Dispense:  30 mL    Refill:  0    Order Specific Question:  Supervising Provider    Answer:  DOOLITTLE, ROBERT P [[id number]]  . Guaifenesin (MUCINEX MAXIMUM STRENGTH) 1200 MG TB12    Sig: Take 1 tablet (1,200 mg total) by mouth every 12 (twelve) hours as needed.    Dispense:  14 tablet    Refill:  1    Order Specific Question:  Supervising Provider    Answer:  DOOLITTLE, ROBERT P [[id number]]

## 2015-06-27 NOTE — Addendum Note (Signed)
Addended by: Carmelina DaneANDERSON, Kiri Hinderliter S on: 06/27/2015 04:21 PM   Modules accepted: Kipp BroodSmartSet

## 2015-06-28 NOTE — Telephone Encounter (Signed)
Called pt and advised message from provider on their voicemail.  

## 2015-07-01 MED ORDER — HYDROCODONE-ACETAMINOPHEN 5-325 MG PO TABS: 1.0000 | ORAL_TABLET | Freq: Four times a day (QID) | ORAL | Status: DC | PRN

## 2015-07-01 NOTE — Telephone Encounter (Signed)
Meds ordered this encounter  Medications  . HYDROcodone-acetaminophen (NORCO) 5-325 MG tablet    Sig: Take 1 tablet by mouth every 6 (six) hours as needed.    Dispense:  30 tablet    Refill:  0    Order Specific Question:  Supervising Provider    Answer:  DOOLITTLE, ROBERT P [[id number]]

## 2015-07-01 NOTE — Telephone Encounter (Signed)
Patient picked up RX for hydrocodone

## 2015-07-07 ENCOUNTER — Encounter: Payer: Self-pay | Admitting: Physician Assistant

## 2015-07-07 DIAGNOSIS — M542 Cervicalgia: Secondary | ICD-10-CM

## 2016-05-15 ENCOUNTER — Ambulatory Visit (INDEPENDENT_AMBULATORY_CARE_PROVIDER_SITE_OTHER): Payer: BLUE CROSS/BLUE SHIELD | Admitting: Physician Assistant

## 2016-05-15 ENCOUNTER — Other Ambulatory Visit: Payer: Self-pay | Admitting: Physician Assistant

## 2016-05-15 VITALS — BP 124/80 | HR 80 | Temp 98.7°F | Resp 17 | Ht 66.0 in | Wt 186.0 lb

## 2016-05-15 DIAGNOSIS — R112 Nausea with vomiting, unspecified: Secondary | ICD-10-CM | POA: Diagnosis not present

## 2016-05-15 DIAGNOSIS — R197 Diarrhea, unspecified: Secondary | ICD-10-CM

## 2016-05-15 LAB — LIPASE: LIPASE: 90 U/L — AB (ref 7–60)

## 2016-05-15 LAB — POCT URINALYSIS DIP (MANUAL ENTRY)
Bilirubin, UA: NEGATIVE
Glucose, UA: NEGATIVE
Ketones, POC UA: NEGATIVE
Leukocytes, UA: NEGATIVE
NITRITE UA: NEGATIVE
PH UA: 6.5
PROTEIN UA: NEGATIVE
SPEC GRAV UA: 1.02
UROBILINOGEN UA: 0.2

## 2016-05-15 LAB — POCT CBC
GRANULOCYTE PERCENT: 73.6 % (ref 37–80)
HEMATOCRIT: 39.8 % (ref 37.7–47.9)
Hemoglobin: 14.5 g/dL (ref 12.2–16.2)
Lymph, poc: 1.9 (ref 0.6–3.4)
MCH: 32.4 pg — AB (ref 27–31.2)
MCHC: 36.5 g/dL — AB (ref 31.8–35.4)
MCV: 88.8 fL (ref 80–97)
MID (CBC): 0.3 (ref 0–0.9)
MPV: 6.5 fL (ref 0–99.8)
PLATELET COUNT, POC: 339 10*3/uL (ref 142–424)
POC GRANULOCYTE: 6 (ref 2–6.9)
POC LYMPH %: 23 % (ref 10–50)
POC MID %: 3.4 %M (ref 0–12)
RBC: 4.48 M/uL (ref 4.04–5.48)
RDW, POC: 12.7 %
WBC: 8.1 10*3/uL (ref 4.6–10.2)

## 2016-05-15 LAB — COMPLETE METABOLIC PANEL WITH GFR
ALBUMIN: 4.3 g/dL (ref 3.6–5.1)
ALK PHOS: 46 U/L (ref 33–115)
ALT: 10 U/L (ref 6–29)
AST: 11 U/L (ref 10–30)
BUN: 11 mg/dL (ref 7–25)
CALCIUM: 9.3 mg/dL (ref 8.6–10.2)
CHLORIDE: 101 mmol/L (ref 98–110)
CO2: 24 mmol/L (ref 20–31)
Creat: 0.67 mg/dL (ref 0.50–1.10)
GFR, Est African American: >89 mL/min (ref >=60)
Glucose, Bld: 99 mg/dL (ref 65–99)
POTASSIUM: 4.7 mmol/L (ref 3.5–5.3)
Sodium: 137 mmol/L (ref 135–146)
Total Bilirubin: 0.6 mg/dL (ref 0.2–1.2)
Total Protein: 6.7 g/dL (ref 6.1–8.1)

## 2016-05-15 LAB — POC MICROSCOPIC URINALYSIS (UMFC): MUCUS RE: ABSENT

## 2016-05-15 LAB — POCT URINE PREGNANCY: PREG TEST UR: NEGATIVE

## 2016-05-15 LAB — GLUCOSE, POCT (MANUAL RESULT ENTRY): POC GLUCOSE: 105 mg/dL — AB (ref 70–99)

## 2016-05-15 LAB — TSH: TSH: 0.64 m[IU]/L

## 2016-05-15 MED ORDER — ONDANSETRON 8 MG PO TBDP: 8.0000 mg | ORAL_TABLET | Freq: Three times a day (TID) | ORAL | 0 refills | Status: DC | PRN

## 2016-05-15 NOTE — Progress Notes (Signed)
[facility] [address], [place] [id number] [phone]  Date:  05/15/2016   Name:  [name] [name] Chimenti   DOB:  08/23/1980   MRN:  [id number]  PCP:  No primary care provider on file.   History of Present Illness:  [name] [name] Nee is a 35 y.o. female patient who [facility], emesis, and diarrhea. Patient reports intermittent episodes of nausea, emesis, and diarrhea for the last 4 weeks. Patient reports that the initial symptoms started with a nonbilious or bloody emesis. She was not able to consume any food or highly duration during this bout. She had no fever. Lower abdominal pain was in the lower quadrant of her abdomen. She also notes that she was starting her menses. She did not recall eating anything or anyone else in her family with similar symptoms. Patient then stated 2 weeks ago she had a similar episode. This time with her symptoms was the addition of canker-like sores in her mouth. She had no fever. And has continued to have no urinary complaints. About 4 days ago, another episode. She has lower abdominal pain. She denies any dizziness. She has no chest pains, palpitations, shortness of breath. She has no family history or personal history of inflammatory bowel disease including ulcerative colitis or Crohn's disease. She does recall having a ulcer in her lower GI however cannot expound upon this. She currently is not nauseous. Patient works as a Insurance claims handlermanager of a restaurant. Her alcohol intake is about 6-7 glasses per week. She doesn't engage in taste things. There is no other prominent health history that she recalls. No recent hospitalizations or visits to hospitals, nursing homes, or shelters. She knows no one who has been sick. No known contact of mono. Patient is married with only one sexual partner. She has not started any new medications. She has no recent antibiotic use. No abnormal vaginal discharge, odor, or abnormal bleeding. Patient's last menstrual  period was 05/11/2016 (approximate).      Patient Active Problem List   Diagnosis Date Noted  . Attention deficit hyperactivity disorder (ADHD) 06/22/2015    Past Medical History:  Diagnosis Date  . ADHD (attention deficit hyperactivity disorder)   . Ectopic pregnancy     Past Surgical History:  Procedure Laterality Date  . WISDOM TOOTH EXTRACTION      Social History  Substance Use Topics  . Smoking status: Former Games developermoker  . Smokeless tobacco: Never Used  . Alcohol use 0.0 oz/week     Comment: a drink every now and again    Family History  Problem Relation Age of Onset  . Hypertension Father   . Heart disease Father     Allergies  Allergen Reactions  . Toradol [Ketorolac Tromethamine] Nausea And Vomiting    Medication list has been reviewed and updated.  Current Outpatient Prescriptions on File Prior to Visit  Medication Sig Dispense Refill  . amphetamine-dextroamphetamine (ADDERALL XR) 25 MG 24 hr capsule Take 25 mg by mouth every morning.    Marland Kitchen. amphetamine-dextroamphetamine (ADDERALL XR) 30 MG 24 hr capsule Take 30 mg by mouth daily.     No current facility-administered medications on file prior to visit.     ROS ROS otherwise unremarkable unless listed above.   Physical Examination: BP 124/80 (BP Location: Right Arm, Patient Position: Sitting, Cuff Size: Normal)   Pulse 80   Temp 98.7 F (37.1 C) (Oral)   Resp 17   Ht 5\' 6"  (1.676 m)   Wt  186 lb (84.4 kg)   LMP 05/11/2016 (Approximate)   SpO2 98%   Breastfeeding? No   BMI 30.02 kg/m  Ideal Body Weight: Weight in (lb) to have BMI = 25: 154.6  Physical Exam  Constitutional: She is oriented to person, place, and time. She appears well-developed and well-nourished. No distress.  HENT:  Head: Normocephalic and atraumatic.  Right Ear: External ear normal.  Left Ear: External ear normal.  Eyes: Conjunctivae and EOM are normal. Pupils are equal, round, and reactive to light.  Cardiovascular: Normal  rate and regular rhythm.  Exam reveals no friction rub.   No murmur heard. Pulmonary/Chest: Effort normal. No respiratory distress. She has no wheezes.  Abdominal: Soft. Bowel sounds are normal. She exhibits no distension. There is no tenderness.  Neurological: She is alert and oriented to person, place, and time.  Skin: She is not diaphoretic.  Psychiatric: She has a normal mood and affect. Her behavior is normal.     Assessment and Plan: [name] [name] Uddin is a 35 y.o. female who is here today for cc of nausea, emesis, and diarrhea intermittently for 4 weeks.  Vitals within normal limits at this time.  Diff dx: Gastroenteritis, diverticulitis, IBS, pancreatitis, cholecystitis, secondary menstrual symptoms, bowel infarction, constipation.  Advised hydration. She was given Zofran today. She can use the over the counter antimotility however cautioned to avoid as if this is infection it may slow down removal of offender. I will obtain the metabolic panel at this time and thyroid. We will proceed from there. Non-intractable vomiting with nausea, unspecified vomiting type - Plan: POCT CBC, COMPLETE METABOLIC PANEL WITH GFR, POCT glucose (manual entry), TSH, POCT urinalysis dipstick, POCT urine pregnancy, POCT Microscopic Urinalysis ([facility]), Gastrointestinal Pathogen Panel PCR, ondansetron (ZOFRAN-ODT) 8 MG disintegrating tablet, Lipase  Diarrhea, unspecified type - Plan: POCT CBC, COMPLETE METABOLIC PANEL WITH GFR, POCT glucose (manual entry), TSH, POCT urinalysis dipstick, POCT urine pregnancy, POCT Microscopic Urinalysis ([facility]), Gastrointestinal Pathogen Panel PCR  Trena PlattStephanie English, PA-C [facility] 11/30/20179:49 AM  Addendum: Added lipid panel for concern of possible triglycerides given increased and elevated lipase. We'll follow-up following the results.

## 2016-05-15 NOTE — Patient Instructions (Addendum)
I will contact you regarding your lab results.  This does not appear to be a urinary tract infection.   Please follow the restrictions below with bouts of diarrhea.   I would also like you to try a probiotic. I am also given anti-nausea medicine.   Food Choices to Help Relieve Diarrhea, Adult When you have diarrhea, the foods you eat and your eating habits are very important. Choosing the right foods and drinks can help relieve diarrhea. Also, because diarrhea can last up to 7 days, you need to replace lost fluids and electrolytes (such as sodium, potassium, and chloride) in order to help prevent dehydration. What general guidelines do I need to follow?  Slowly drink 1 cup (8 oz) of fluid for each episode of diarrhea. If you are getting enough fluid, your urine will be clear or pale yellow.  Eat starchy foods. Some good choices include white rice, white toast, pasta, low-fiber cereal, baked potatoes (without the skin), saltine crackers, and bagels.  Avoid large servings of any cooked vegetables.  Limit fruit to two servings per day. A serving is  cup or 1 small piece.  Choose foods with less than 2 g of fiber per serving.  Limit fats to less than 8 tsp (38 g) per day.  Avoid fried foods.  Eat foods that have probiotics in them. Probiotics can be found in certain dairy products.  Avoid foods and beverages that may increase the speed at which food moves through the stomach and intestines (gastrointestinal tract). Things to avoid include:  High-fiber foods, such as dried fruit, raw fruits and vegetables, nuts, seeds, and whole grain foods.  Spicy foods and high-fat foods.  Foods and beverages sweetened with high-fructose corn syrup, honey, or sugar alcohols such as xylitol, sorbitol, and mannitol. What foods are recommended? Grains  White rice. White, [place]French, or pita breads (fresh or toasted), including plain rolls, buns, or bagels. White pasta. Saltine, soda, or graham crackers.  Pretzels. Low-fiber cereal. Cooked cereals made with water (such as cornmeal, farina, or cream cereals). Plain muffins. Matzo. Melba toast. Zwieback. Vegetables  Potatoes (without the skin). Strained tomato and vegetable juices. Most well-cooked and canned vegetables without seeds. Tender lettuce. Fruits  Cooked or canned applesauce, apricots, cherries, fruit cocktail, grapefruit, peaches, pears, or plums. Fresh bananas, apples without skin, cherries, grapes, cantaloupe, grapefruit, peaches, oranges, or plums. Meat and Other Protein Products  Baked or boiled chicken. Eggs. Tofu. Fish. Seafood. Smooth peanut butter. Ground or well-cooked tender beef, ham, veal, lamb, pork, or poultry. Dairy  Plain yogurt, kefir, and unsweetened liquid yogurt. Lactose-free milk, buttermilk, or soy milk. Plain hard cheese. Beverages  Sport drinks. Clear broths. Diluted fruit juices (except prune). Regular, caffeine-free sodas such as ginger ale. Water. Decaffeinated teas. Oral rehydration solutions. Sugar-free beverages not sweetened with sugar alcohols. Other  Bouillon, broth, or soups made from recommended foods. The items listed above may not be a complete list of recommended foods or beverages. Contact your dietitian for more options.  What foods are not recommended? Grains  Whole grain, whole wheat, bran, or rye breads, rolls, pastas, crackers, and cereals. Wild or brown rice. Cereals that contain more than 2 g of fiber per serving. Corn tortillas or taco shells. Cooked or dry oatmeal. Granola. Popcorn. Vegetables  Raw vegetables. Cabbage, broccoli, Brussels sprouts, artichokes, baked beans, beet greens, corn, kale, legumes, peas, sweet potatoes, and yams. Potato skins. Cooked spinach and cabbage. Fruits  Dried fruit, including raisins and dates. Raw fruits. Stewed or dried  prunes. Fresh apples with skin, apricots, mangoes, pears, raspberries, and strawberries. Meat and Other Protein Products  Chunky peanut  butter. Nuts and seeds. Beans and lentils. Tomasa BlaseBacon. Dairy  High-fat cheeses. Milk, chocolate milk, and beverages made with milk, such as milk shakes. Cream. Ice cream. Sweets and Desserts  Sweet rolls, doughnuts, and sweet breads. Pancakes and waffles. Fats and Oils  Butter. Cream sauces. Margarine. Salad oils. Plain salad dressings. Olives. Avocados. Beverages  Caffeinated beverages (such as coffee, tea, soda, or energy drinks). Alcoholic beverages. Fruit juices with pulp. Prune juice. Soft drinks sweetened with high-fructose corn syrup or sugar alcohols. Other  Coconut. Hot sauce. Chili powder. Mayonnaise. Gravy. Cream-based or milk-based soups. The items listed above may not be a complete list of foods and beverages to avoid. Contact your dietitian for more information.  What should I do if I become dehydrated? Diarrhea can sometimes lead to dehydration. Signs of dehydration include dark urine and dry mouth and skin. If you think you are dehydrated, you should rehydrate with an oral rehydration solution. These solutions can be purchased at pharmacies, retail stores, or online. Drink -1 cup (120-240 mL) of oral rehydration solution each time you have an episode of diarrhea. If drinking this amount makes your diarrhea worse, try drinking smaller amounts more often. For example, drink 1-3 tsp (5-15 mL) every 5-10 minutes. A general rule for staying hydrated is to drink 1-2 L of fluid per day. Talk to your health care provider about the specific amount you should be drinking each day. Drink enough fluids to keep your urine clear or pale yellow. This information is not intended to replace advice given to you by your health care provider. Make sure you discuss any questions you have with your health care provider. Document Released: 08/25/2003 Document Revised: 11/10/2015 Document Reviewed: 04/27/2013 Elsevier Interactive Patient Education  2017 [employer]     IF you received an x-ray today,  you will receive an invoice from [facility]. Please contact [facility] at [phone] with questions or concerns regarding your invoice.   IF you received labwork today, you will receive an invoice from [facility]. Please contact [facility] at [phone] with questions or concerns regarding your invoice.   Our billing staff will not be able to assist you with questions regarding bills from these companies.  You will be contacted with the lab results as soon as they are available. The fastest way to get your results is to activate your My Chart account. Instructions are located on the last page of this paperwork. If you have not heard from us regarding the results in 2 weeks, please contact this office.

## 2016-05-16 LAB — GASTROINTESTINAL PATHOGEN PANEL PCR
C. DIFFICILE TOX A/B, PCR: NOT DETECTED
CRYPTOSPORIDIUM, PCR: NOT DETECTED
Campylobacter, PCR: NOT DETECTED
E COLI (ETEC) LT/ST, PCR: NOT DETECTED
E COLI (STEC) STX1/STX2, PCR: NOT DETECTED
E coli 0157, PCR: NOT DETECTED
GIARDIA LAMBLIA, PCR: NOT DETECTED
Norovirus, PCR: NOT DETECTED
Rotavirus A, PCR: NOT DETECTED
Salmonella, PCR: NOT DETECTED
Shigella, PCR: NOT DETECTED

## 2016-05-17 ENCOUNTER — Other Ambulatory Visit: Payer: Self-pay | Admitting: Physician Assistant

## 2016-05-18 LAB — LIPID PANEL
CHOLESTEROL: 154 mg/dL (ref <200)
HDL: 86 mg/dL (ref >50)
LDL CALC: 42 mg/dL (ref <100)
Total CHOL/HDL Ratio: 1.8 Ratio (ref <5.0)
Triglycerides: 132 mg/dL (ref <150)
VLDL: 26 mg/dL (ref <30)

## 2017-12-10 DIAGNOSIS — F902 Attention-deficit hyperactivity disorder, combined type: Secondary | ICD-10-CM | POA: Diagnosis not present

## 2018-04-14 DIAGNOSIS — F902 Attention-deficit hyperactivity disorder, combined type: Secondary | ICD-10-CM | POA: Diagnosis not present

## 2018-07-07 DIAGNOSIS — F902 Attention-deficit hyperactivity disorder, combined type: Secondary | ICD-10-CM | POA: Diagnosis not present

## 2018-10-06 DIAGNOSIS — F902 Attention-deficit hyperactivity disorder, combined type: Secondary | ICD-10-CM | POA: Diagnosis not present

## 2019-01-05 DIAGNOSIS — F902 Attention-deficit hyperactivity disorder, combined type: Secondary | ICD-10-CM | POA: Diagnosis not present

## 2019-03-19 ENCOUNTER — Encounter (HOSPITAL_COMMUNITY): Payer: Self-pay

## 2019-03-19 ENCOUNTER — Other Ambulatory Visit: Payer: Self-pay

## 2019-03-19 ENCOUNTER — Ambulatory Visit (HOSPITAL_COMMUNITY)
Admission: EM | Admit: 2019-03-19 | Discharge: 2019-03-19 | Disposition: A | Discharge status: Home / Self Care | Payer: BC Managed Care – PPO | Attending: Emergency Medicine | Admitting: Emergency Medicine

## 2019-03-19 DIAGNOSIS — Z20828 Contact with and (suspected) exposure to other viral communicable diseases: Secondary | ICD-10-CM | POA: Diagnosis not present

## 2019-03-19 DIAGNOSIS — K529 Noninfective gastroenteritis and colitis, unspecified: Secondary | ICD-10-CM

## 2019-03-19 MED ORDER — ONDANSETRON 4 MG PO TBDP
4.0000 mg | ORAL_TABLET | Freq: Once | ORAL | Status: AC
  Administered 2019-03-19: 4 mg via ORAL

## 2019-03-19 MED ORDER — FAMOTIDINE 20 MG PO TABS: 20.0000 mg | ORAL_TABLET | Freq: Two times a day (BID) | ORAL | 0 refills | Status: DC

## 2019-03-19 MED ORDER — ONDANSETRON 8 MG PO TBDP: ORAL_TABLET | ORAL | 0 refills | Status: DC

## 2019-03-19 MED ORDER — ONDANSETRON 4 MG PO TBDP
ORAL_TABLET | ORAL | Status: AC
  Filled 2019-03-19: qty 1

## 2019-03-19 MED ORDER — DIPHENOXYLATE-ATROPINE 2.5-0.025 MG PO TABS: 1.0000 | ORAL_TABLET | Freq: Four times a day (QID) | ORAL | 0 refills | Status: DC | PRN

## 2019-03-19 NOTE — Discharge Instructions (Signed)
Zofran may make you constipated, so if it does not work, then go ahead and start the Lomotil.  The Pepcid will help with the water brash.  Make sure you push plenty of electrolyte containing fluids such as Pedialyte or Gatorade until your urine is clear.  If you are unable to do this, you abdominal pain, fevers above 100.4, or for any other concerns, go immediately to the ER.

## 2019-03-19 NOTE — ED Triage Notes (Signed)
Patient presents to Urgent Care with complaints of intermittent vomiting over the past month. Patient reports she felt really faint at work and they sent her here to be assessed and tested for COVID. Pt states she feels like she has some heartburn that sends acid up into her throat.  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

## 2019-03-19 NOTE — ED Provider Notes (Signed)
HPI  SUBJECTIVE: I [name] [name] is a 38 y.o. female who presents with multiple episodes of nonbilious nonbloody emesis starting 2 days ago.  States that this resolved yesterday, and she is now tolerating p.o.  She now feels generalized weakness, and reports several episodes of watery, nonbloody diarrhea starting yesterday.  She states that she ate some questionable food from a restaurant 2 days ago.  She reports diffuse crampy abdominal pain prior to vomiting,which resolves afterwards.  The abdominal pain has resolved.  She also reports burning chest pain, water brash prior to vomiting.  She states that when she feels this, she is unable to stop vomiting.  No fevers, coughing, wheezing, shortness of breath.  No nasal congestion, rhinorrhea, sore throat, loss of sense of taste or smell, body aches, headaches, current abdominal pain.  No abdominal distention.  No change in urine output.  She has no urinary complaints other than her urine appearing darker than normal.  She was sent here from work to be tested for possible COVID.  She has no known exposure to COVID, but works at [employer].  Past medical history negative for diabetes, hypertension, excess alcohol use, pancreatitis, gallbladder disease.  She has a history of ectopic pregnancy.  She is also wanting to check a pregnancy test as she states that the nausea and vomiting usually accompanies menses, and that she had menses 2 weeks ago.  She is wondering if she is about to get her period again.    Past Medical History:  Diagnosis Date  . ADHD (attention deficit hyperactivity disorder)   . Ectopic pregnancy     Past Surgical History:  Procedure Laterality Date  . WISDOM TOOTH EXTRACTION      Family History  Problem Relation Age of Onset  . Hypertension Father   . Heart disease Father   . Healthy Mother     Social History   Tobacco Use  . Smoking status: Former Games developer  . Smokeless tobacco: Never Used  Substance Use Topics  .  Alcohol use: Yes    Alcohol/week: 0.0 standard drinks    Comment: a drink every now and again  . Drug use: No    No current facility-administered medications for this encounter.   Current Outpatient Medications:  .  amphetamine-dextroamphetamine (ADDERALL XR) 25 MG 24 hr capsule, Take 25 mg by mouth every morning., Disp: , Rfl:  .  amphetamine-dextroamphetamine (ADDERALL XR) 30 MG 24 hr capsule, Take 30 mg by mouth daily., Disp: , Rfl:  .  diphenoxylate-atropine (LOMOTIL) 2.5-0.025 MG tablet, Take 1 tablet by mouth 4 (four) times daily as needed for diarrhea or loose stools., Disp: 30 tablet, Rfl: 0 .  famotidine (PEPCID) 20 MG tablet, Take 1 tablet (20 mg total) by mouth 2 (two) times daily., Disp: 40 tablet, Rfl: 0 .  ondansetron (ZOFRAN ODT) 8 MG disintegrating tablet, 1/2- 1 tablet q 8 hr prn nausea, vomiting, Disp: 20 tablet, Rfl: 0  Allergies  Allergen Reactions  . Toradol [Ketorolac Tromethamine] Nausea And Vomiting     ROS  As noted in HPI.   Physical Exam  BP (!) 131/101 (BP Location: Right Arm)   Pulse (!) 120   Temp 97.6 F (36.4 C) (Oral)   Resp 16   SpO2 98%   Constitutional: Well developed, well nourished, no acute distress Eyes: PERRL, EOMI, conjunctiva normal bilaterally HENT: Normocephalic, atraumatic,mucus membranes moist Respiratory: Clear to auscultation bilaterally, no rales, no wheezing, no rhonchi Cardiovascular: regular tachycardia, no murmurs, no gallops, no  rubs. Cap refill 2-3 seconds. GI: Soft, nondistended, normal bowel sounds, nontender, no rebound, no guarding Back: no CVAT skin: No rash, skin intact Musculoskeletal: No edema, no tenderness, no deformities Neurologic: Alert & oriented x 3, CN III-XII grossly intact, no motor deficits, sensation grossly intact Psychiatric: Speech and behavior appropriate   ED Course   Medications  ondansetron (ZOFRAN-ODT) disintegrating tablet 4 mg (4 mg Oral Given 03/19/19 1955)  ondansetron  (ZOFRAN-ODT) 4 MG disintegrating tablet (has no administration in time range)    Orders Placed This Encounter  Procedures  . Novel Coronavirus, NAA (Hosp order, Send-out to Ref Lab; TAT 18-24 hrs    Standing Status:   Standing    Number of Occurrences:   1    Order Specific Question:   Is this test for diagnosis or screening    Answer:   Diagnosis of ill patient    Order Specific Question:   Symptomatic for COVID-19 as defined by CDC    Answer:   Yes    Order Specific Question:   Date of Symptom Onset    Answer:   03/18/2019    Order Specific Question:   Hospitalized for COVID-19    Answer:   Yes    Order Specific Question:   Admitted to ICU for COVID-19    Answer:   No    Order Specific Question:   Previously tested for COVID-19    Answer:   No    Order Specific Question:   Resident in a congregate (group) care setting    Answer:   No    Order Specific Question:   Employed in healthcare setting    Answer:   No    Order Specific Question:   Pregnant    Answer:   No   No results found for this or any previous visit (from the past 24 hour(s)). No results found.  ED Clinical Impression  1. Gastroenteritis      ED Assessment/Plan  Pt is dehydrated but tolerating po. Abdomen benign. States that the vomiting has resolved, diarrhea mild and slowing down. Suspect viral gatroenteritis. COVID test sent. checking UA upreg per pt request.   Would like to try oral rehydration at home.  Push electrolyte containing fluids, Zofran 8 mg tid, lomotil, Pepcid.  Patient was tolerating p.o. prior to discharge.  She was unable to give us a urine sample while in the department.  She may return here for pregnancy testing if she has continued concerns for it.  2-day work note.  Discussed MDM, treatment plan, and plan for follow-up with patient Discussed sn/sx that should prompt return to the ED. patient agrees with plan.   Meds ordered this encounter  Medications  . ondansetron (ZOFRAN-ODT)  disintegrating tablet 4 mg  . ondansetron (ZOFRAN ODT) 8 MG disintegrating tablet    Sig: 1/2- 1 tablet q 8 hr prn nausea, vomiting    Dispense:  20 tablet    Refill:  0  . famotidine (PEPCID) 20 MG tablet    Sig: Take 1 tablet (20 mg total) by mouth 2 (two) times daily.    Dispense:  40 tablet    Refill:  0  . diphenoxylate-atropine (LOMOTIL) 2.5-0.025 MG tablet    Sig: Take 1 tablet by mouth 4 (four) times daily as needed for diarrhea or loose stools.    Dispense:  30 tablet    Refill:  0    *This clinic note was created using Scientist, clinical (histocompatibility and immunogenetics)Dragon dictation software. Therefore, there may  be occasional mistakes despite careful proofreading.  ?    Melynda Ripple, MD 03/20/19 1020

## 2019-03-21 LAB — NOVEL CORONAVIRUS, NAA (HOSP ORDER, SEND-OUT TO REF LAB; TAT 18-24 HRS): SARS-CoV-2, NAA: NOT DETECTED

## 2019-03-23 ENCOUNTER — Encounter (HOSPITAL_COMMUNITY): Payer: Self-pay

## 2019-04-06 DIAGNOSIS — F902 Attention-deficit hyperactivity disorder, combined type: Secondary | ICD-10-CM | POA: Diagnosis not present

## 2019-04-14 ENCOUNTER — Telehealth: Payer: BC Managed Care – PPO | Admitting: Nurse Practitioner

## 2019-04-14 DIAGNOSIS — M5441 Lumbago with sciatica, right side: Secondary | ICD-10-CM | POA: Diagnosis not present

## 2019-04-14 MED ORDER — CYCLOBENZAPRINE HCL 10 MG PO TABS: 10.0000 mg | ORAL_TABLET | Freq: Three times a day (TID) | ORAL | 1 refills | Status: DC | PRN

## 2019-04-14 MED ORDER — NAPROXEN 500 MG PO TABS: 500.0000 mg | ORAL_TABLET | Freq: Two times a day (BID) | ORAL | 1 refills | Status: DC

## 2019-04-14 NOTE — Progress Notes (Signed)

## 2019-04-29 ENCOUNTER — Telehealth: Payer: BC Managed Care – PPO | Admitting: Nurse Practitioner

## 2019-04-29 DIAGNOSIS — M5441 Lumbago with sciatica, right side: Secondary | ICD-10-CM

## 2019-04-29 MED ORDER — CYCLOBENZAPRINE HCL 10 MG PO TABS: 10.0000 mg | ORAL_TABLET | Freq: Three times a day (TID) | ORAL | 1 refills | Status: DC | PRN

## 2019-04-29 MED ORDER — PREDNISONE 10 MG (21) PO TBPK: ORAL_TABLET | ORAL | 0 refills | Status: DC

## 2019-04-29 NOTE — Addendum Note (Signed)
Addended by: Chevis Pretty on: 04/29/2019 05:42 PM   Modules accepted: Orders

## 2019-04-29 NOTE — Progress Notes (Signed)
We are sorry that you are not feeling well.  Here is how we plan to help!  Based on what you have shared with me it looks like you mostly have acute back pain.  Acute back pain is defined as musculoskeletal pain that can resolve in 1-3 weeks with conservative treatment.  I have prescribed a prednisone dose pack, see below instructions for taking.  Some patients experience stomach irritation or in increased heartburn with anti-inflammatory drugs.  Please keep in mind that muscle relaxer's can cause fatigue and should not be taken while at work or driving.  Back pain is very common.  The pain often gets better over time.  The cause of back pain is usually not dangerous.  Most people can learn to manage their back pain on their own.  Directions for 6 day taper: Day 1: 2 tablets before breakfast, 1 after both lunch & dinner and 2 at bedtime Day 2: 1 tab before breakfast, 1 after both lunch & dinner and 2 at bedtime Day 3: 1 tab at each meal & 1 at bedtime Day 4: 1 tab at breakfast, 1 at lunch, 1 at bedtime Day 5: 1 tab at breakfast & 1 tab at bedtime Day 6: 1 tab at breakfast   Home Care  Stay active.  Start with short walks on flat ground if you can.  Try to walk farther each day.  Do not sit, drive or stand in one place for more than 30 minutes.  Do not stay in bed.  Do not avoid exercise or work.  Activity can help your back heal faster.  Be careful when you bend or lift an object.  Bend at your knees, keep the object close to you, and do not twist.  Sleep on a firm mattress.  Lie on your side, and bend your knees.  If you lie on your back, put a pillow under your knees.  Only take medicines as told by your doctor.  Put ice on the injured area.  Put ice in a plastic bag  Place a towel between your skin and the bag  Leave the ice on for 15-20 minutes, 3-4 times a day for the first 2-3 days. 210 After that, you can switch between ice and heat packs.  Ask your doctor about back  exercises or massage.  Avoid feeling anxious or stressed.  Find good ways to deal with stress, such as exercise.  Get Help Right Way If:  Your pain does not go away with rest or medicine.  Your pain does not go away in 1 week.  You have new problems.  You do not feel well.  The pain spreads into your legs.  You cannot control when you poop (bowel movement) or pee (urinate)  You feel sick to your stomach (nauseous) or throw up (vomit)  You have belly (abdominal) pain.  You feel like you may pass out (faint).  If you develop a fever.  Make Sure you:  Understand these instructions.  Will watch your condition  Will get help right away if you are not doing well or get worse.  Your e-visit answers were reviewed by a board certified advanced clinical practitioner to complete your personal care plan.  Depending on the condition, your plan could have included both over the counter or prescription medications.  If there is a problem please reply  once you have received a response from your provider.  Your safety is important to [place].  If you have  drug allergies check your prescription carefully.    You can use MyChart to ask questions about today's visit, request a non-urgent call back, or ask for a work or school excuse for 24 hours related to this e-Visit. If it has been greater than 24 hours you will need to follow up with your provider, or enter a new e-Visit to address those concerns.   You will get an e-mail in the next two days asking about your experience.  I hope that your e-visit has been valuable and will speed your recovery. Thank you for using e-visits.  5-10 minutes spent reviewing and documenting in chart.

## 2019-07-02 ENCOUNTER — Other Ambulatory Visit: Payer: Self-pay | Admitting: Nurse Practitioner

## 2019-07-06 DIAGNOSIS — F902 Attention-deficit hyperactivity disorder, combined type: Secondary | ICD-10-CM | POA: Diagnosis not present

## 2019-07-08 ENCOUNTER — Telehealth: Payer: BC Managed Care – PPO | Admitting: Family

## 2019-07-08 DIAGNOSIS — M5441 Lumbago with sciatica, right side: Secondary | ICD-10-CM

## 2019-07-08 NOTE — Progress Notes (Signed)
Based on what you shared with me, I feel your condition warrants further evaluation and I recommend that you be seen for a face to face office visit.  Given you have tried NSAID"s, prednisone,  a muscle relaxer, and rest, it would be best to be seen face to face for further evaluation.    NOTE: If you entered your credit card information for this eVisit, you will not be charged. You may see a "hold" on your card for the $35 but that hold will drop off and you will not have a charge processed.   If you are having a true medical emergency please call 911.      For an urgent face to face visit, [place] has five urgent care centers for your convenience:      NEW:  [facility] at [facility] Directions [phone] [address] [place], [place] [address] . 10 am - 6pm Monday - Friday    [facility]) Get Driving Directions [phone] [address] [place], [place] [address] . 10 am to 8 pm Monday-Friday . 12 pm to 8 pm [facility] Get Driving Directions [phone] [address] [place], [place] [address] . 8 am to 8 pm Monday-Friday . 9 am to 6 pm Saturday . 11 am to 6 pm Sunday     [facility] at [facility] Get Driving Directions  [phone] [address] [place], [place] [address] . 8 am to 8 pm Monday-Friday . 8 am to 4 pm [facility] at [facility] Directions [phone] [address] [place], [place] [address] . 12 pm to 6 pm Monday-Friday      Your e-visit answers were reviewed by a board certified advanced clinical practitioner to complete your personal care plan.  Thank you for using e-Visits.

## 2019-07-13 DIAGNOSIS — M545 Low back pain: Secondary | ICD-10-CM | POA: Diagnosis not present

## 2019-07-13 DIAGNOSIS — Z79899 Other long term (current) drug therapy: Secondary | ICD-10-CM | POA: Diagnosis not present

## 2019-07-13 DIAGNOSIS — Z23 Encounter for immunization: Secondary | ICD-10-CM | POA: Diagnosis not present

## 2019-07-13 DIAGNOSIS — M129 Arthropathy, unspecified: Secondary | ICD-10-CM | POA: Diagnosis not present

## 2019-07-13 DIAGNOSIS — M25561 Pain in right knee: Secondary | ICD-10-CM | POA: Diagnosis not present

## 2019-07-13 DIAGNOSIS — E559 Vitamin D deficiency, unspecified: Secondary | ICD-10-CM | POA: Diagnosis not present

## 2019-07-13 DIAGNOSIS — Z03818 Encounter for observation for suspected exposure to other biological agents ruled out: Secondary | ICD-10-CM | POA: Diagnosis not present

## 2019-07-22 ENCOUNTER — Other Ambulatory Visit: Payer: Self-pay | Admitting: Nurse Practitioner

## 2019-08-04 ENCOUNTER — Ambulatory Visit: Payer: BLUE CROSS/BLUE SHIELD | Admitting: Registered Nurse

## 2019-08-05 ENCOUNTER — Other Ambulatory Visit: Payer: Self-pay

## 2019-08-05 ENCOUNTER — Ambulatory Visit (INDEPENDENT_AMBULATORY_CARE_PROVIDER_SITE_OTHER): Payer: BLUE CROSS/BLUE SHIELD

## 2019-08-05 ENCOUNTER — Encounter: Payer: Self-pay | Admitting: Registered Nurse

## 2019-08-05 ENCOUNTER — Ambulatory Visit: Payer: BLUE CROSS/BLUE SHIELD | Admitting: Registered Nurse

## 2019-08-05 VITALS — BP 149/107 | HR 100 | Temp 97.5°F | Ht 66.0 in | Wt 201.0 lb

## 2019-08-05 DIAGNOSIS — M87051 Idiopathic aseptic necrosis of right femur: Secondary | ICD-10-CM | POA: Diagnosis not present

## 2019-08-05 DIAGNOSIS — R030 Elevated blood-pressure reading, without diagnosis of hypertension: Secondary | ICD-10-CM

## 2019-08-05 DIAGNOSIS — M5441 Lumbago with sciatica, right side: Secondary | ICD-10-CM

## 2019-08-05 DIAGNOSIS — S73001A Unspecified subluxation of right hip, initial encounter: Secondary | ICD-10-CM | POA: Diagnosis not present

## 2019-08-05 DIAGNOSIS — M87052 Idiopathic aseptic necrosis of left femur: Secondary | ICD-10-CM | POA: Insufficient documentation

## 2019-08-05 DIAGNOSIS — G8929 Other chronic pain: Secondary | ICD-10-CM

## 2019-08-05 DIAGNOSIS — M545 Low back pain: Secondary | ICD-10-CM | POA: Diagnosis not present

## 2019-08-05 MED ORDER — CYCLOBENZAPRINE HCL 10 MG PO TABS: ORAL_TABLET | ORAL | 1 refills | Status: DC

## 2019-08-05 MED ORDER — CHLORTHALIDONE 25 MG PO TABS: 25.0000 mg | ORAL_TABLET | Freq: Every day | ORAL | 1 refills | Status: DC

## 2019-08-05 MED ORDER — MELOXICAM 15 MG PO TABS: 15.0000 mg | ORAL_TABLET | Freq: Every day | ORAL | 0 refills | Status: DC

## 2019-08-05 MED ORDER — TRAMADOL HCL 50 MG PO TABS: 50.0000 mg | ORAL_TABLET | Freq: Three times a day (TID) | ORAL | 0 refills | Status: AC | PRN

## 2019-08-05 NOTE — Patient Instructions (Signed)
° ° ° °  If you have lab work done today you will be contacted with your lab results within the next 2 weeks.  If you have not heard from us then please contact us. The fastest way to get your results is to register for My Chart. ° ° °IF you received an x-ray today, you will receive an invoice from [facility]. Please contact [facility] at [phone] with questions or concerns regarding your invoice.  ° °IF you received labwork today, you will receive an invoice from [facility]. Please contact [facility] at [phone] with questions or concerns regarding your invoice.  ° °Our billing staff will not be able to assist you with questions regarding bills from these companies. ° °You will be contacted with the lab results as soon as they are available. The fastest way to get your results is to activate your My Chart account. Instructions are located on the last page of this paperwork. If you have not heard from us regarding the results in 2 weeks, please contact this office. °  ° ° ° °

## 2019-08-05 NOTE — Progress Notes (Signed)
Acute Office Visit  Subjective:    Patient ID: [name] [name], female    DOB: 1980-11-22, 39 y.o.   MRN: [id number]  Chief Complaint  Patient presents with  . Hip Pain    severe hip and knee pain on the right side down to the foot. Sometimes the knee is hot to touch patient sayd its been going on for 6 months now. Went to [facility] and was given  Celecoxib 400 mg and feels like its starting some blood pressure issues  , also cyclobenzaprine for muscle spasms and seemed to be working but out of it. Patient had recent x-rays done and no results back yet.  . Medication Refill    cyclobenzaprine 10 mg    HPI Patient is in today for R hip pain.   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  Recently seen at [facility], was given Xrays of spine and celecoxib 400mg  and flexeril 10mg  PO tid for relief but this has had minimal effect Pt feels pain originating in lower right back, deep in R buttock, wrapping around towards groin on R side as well as down leg. Knee has been painful as well, some redness on lateral joint line, but far less so than hip. No acute injury that she's aware of, no history of injury to that extremity to her knowledge.   Past Medical History:  Diagnosis Date  . ADHD (attention deficit hyperactivity disorder)   . Ectopic pregnancy     Past Surgical History:  Procedure Laterality Date  . WISDOM TOOTH EXTRACTION      Family History  Problem Relation Age of Onset  . Hypertension Father   . Heart disease Father   . Healthy Mother     Social History   Socioeconomic History  . Marital status: Divorced    Spouse name: Deshaun Schou  . Number of children: 0  . Years of education: 66  . Highest education level: Not on file  Occupational History  . Occupation: Solicitor    Comment: [employer]  . Occupation: blogger    Comment: beer blog for [employer]  Tobacco Use  .  Smoking status: Former Research scientist (life sciences)  . Smokeless tobacco: Never Used  Substance and Sexual Activity  . Alcohol use: Yes    Alcohol/week: 0.0 standard drinks    Comment: a drink every now and again  . Drug use: No  . Sexual activity: Yes    Partners: Male    Birth control/protection: None    Comment: trying to become pregnant  Other Topics Concern  . Not on file  Social History Narrative   Lives with her husband.   Mother and grandfather live nearby.   Social Determinants of Health   Financial Resource Strain:   . Difficulty of Paying Living Expenses: Not on file  Food Insecurity:   . Worried About Charity fundraiser in the Last Year: Not on file  . Ran Out of Food in the Last Year: Not on file  Transportation Needs:   . Lack of Transportation (Medical): Not on file  . Lack of Transportation (Non-Medical): Not on file  Physical Activity:   . Days of Exercise per Week: Not on file  . Minutes of Exercise per Session: Not on file  Stress:   . Feeling of Stress : Not on file  Social Connections:   . Frequency of Communication  with Friends and Family: Not on file  . Frequency of Social Gatherings with Friends and Family: Not on file  . Attends Religious Services: Not on file  . Active Member of Clubs or Organizations: Not on file  . Attends Banker Meetings: Not on file  . Marital Status: Not on file  Intimate Partner Violence:   . Fear of Current or Ex-Partner: Not on file  . Emotionally Abused: Not on file  . Physically Abused: Not on file  . Sexually Abused: Not on file    Outpatient Medications Prior to Visit  Medication Sig Dispense Refill  . amphetamine-dextroamphetamine (ADDERALL XR) 25 MG 24 hr capsule Take 25 mg by mouth every morning.    Marland Kitchen amphetamine-dextroamphetamine (ADDERALL XR) 30 MG 24 hr capsule Take 30 mg by mouth daily.    . diphenoxylate-atropine (LOMOTIL) 2.5-0.025 MG tablet Take 1 tablet by mouth 4 (four) times daily as needed for diarrhea or  loose stools. 30 tablet 0  . famotidine (PEPCID) 20 MG tablet Take 1 tablet (20 mg total) by mouth 2 (two) times daily. 40 tablet 0  . naproxen (NAPROSYN) 500 MG tablet Take 1 tablet (500 mg total) by mouth 2 (two) times daily with a meal. 60 tablet 1  . ondansetron (ZOFRAN ODT) 8 MG disintegrating tablet 1/2- 1 tablet q 8 hr prn nausea, vomiting 20 tablet 0  . predniSONE (STERAPRED UNI-PAK 21 TAB) 10 MG (21) TBPK tablet As directed x 6 days 21 tablet 0  . cyclobenzaprine (FLEXERIL) 10 MG tablet TAKE 1 TABLET(10 MG) BY MOUTH THREE TIMES DAILY AS NEEDED FOR MUSCLE SPASMS 30 tablet 1   No facility-administered medications prior to visit.    Allergies  Allergen Reactions  . Toradol [Ketorolac Tromethamine] Nausea And Vomiting    Review of Systems Per hpi, others negative    Objective:    Physical Exam Vitals and nursing note reviewed.  Constitutional:      General: She is not in acute distress.    Appearance: Normal appearance. She is obese. She is not ill-appearing, toxic-appearing or diaphoretic.  Cardiovascular:     Rate and Rhythm: Normal rate and regular rhythm.  Pulmonary:     Effort: Pulmonary effort is normal. No respiratory distress.  Musculoskeletal:        General: Tenderness and signs of injury present. No swelling or deformity.     Cervical back: Normal range of motion.     Right hip: Tenderness and bony tenderness present. No crepitus. Decreased range of motion. Decreased strength.     Left hip: Normal.     Right lower leg: No edema.     Left lower leg: No edema.  Skin:    General: Skin is warm and dry.     Capillary Refill: Capillary refill takes less than 2 seconds.  Neurological:     General: No focal deficit present.     Mental Status: She is alert and oriented to person, place, and time. Mental status is at baseline.     Cranial Nerves: No cranial nerve deficit.     Sensory: No sensory deficit.     Motor: No weakness.     Coordination: Coordination normal.      Gait: Gait normal.     Deep Tendon Reflexes: Reflexes normal.  Psychiatric:        Mood and Affect: Mood normal.        Behavior: Behavior normal.        Thought Content: Thought content  normal.        Judgment: Judgment normal.     BP (!) 149/107   Pulse 100   Temp (!) 97.5 F (36.4 C) (Temporal)   Ht 5\' 6"  (1.676 m)   Wt 201 lb (91.2 kg)   LMP 08/05/2019   SpO2 99%   BMI 32.44 kg/m  Wt Readings from Last 3 Encounters:  08/05/19 201 lb (91.2 kg)  05/15/16 186 lb (84.4 kg)  06/22/15 182 lb (82.6 kg)    Health Maintenance Due  Topic Date Due  . HIV Screening  03/22/1995  . PAP SMEAR-Modifier  03/21/2001    There are no preventive care reminders to display for this patient.   Lab Results  Component Value Date   TSH 0.64 05/15/2016   Lab Results  Component Value Date   WBC 8.1 05/15/2016   HGB 14.5 05/15/2016   HCT 39.8 05/15/2016   MCV 88.8 05/15/2016   PLT 371 11/19/2014   Lab Results  Component Value Date   NA 137 05/15/2016   K 4.7 05/15/2016   CO2 24 05/15/2016   GLUCOSE 99 05/15/2016   BUN 11 05/15/2016   CREATININE 0.67 05/15/2016   BILITOT 0.6 05/15/2016   ALKPHOS 46 05/15/2016   AST 11 05/15/2016   ALT 10 05/15/2016   PROT 6.7 05/15/2016   ALBUMIN 4.3 05/15/2016   CALCIUM 9.3 05/15/2016   Lab Results  Component Value Date   CHOL 154 05/15/2016   Lab Results  Component Value Date   HDL 86 05/15/2016   Lab Results  Component Value Date   LDLCALC 42 05/15/2016   Lab Results  Component Value Date   TRIG 132 05/15/2016   Lab Results  Component Value Date   CHOLHDL 1.8 05/15/2016   No results found for: HGBA1C     Assessment & Plan:   Problem List Items Addressed This Visit    None    Visit Diagnoses    Chronic right-sided low back pain with right-sided sciatica    -  Primary   Relevant Medications   traMADol (ULTRAM) 50 MG tablet   meloxicam (MOBIC) 15 MG tablet   cyclobenzaprine (FLEXERIL) 10 MG tablet   Other  Relevant Orders   DG Lumbar Spine Complete (Completed)   DG Hip Unilat W OR W/O Pelvis 1V Right (Completed)       Meds ordered this encounter  Medications  . traMADol (ULTRAM) 50 MG tablet    Sig: Take 1 tablet (50 mg total) by mouth every 8 (eight) hours as needed for up to 5 days.    Dispense:  15 tablet    Refill:  0    Order Specific Question:   Supervising Provider    Answer:   05/17/2016 A Collie Siad  . meloxicam (MOBIC) 15 MG tablet    Sig: Take 1 tablet (15 mg total) by mouth daily.    Dispense:  30 tablet    Refill:  0    Order Specific Question:   Supervising Provider    Answer:   [id number] A Collie Siad  . cyclobenzaprine (FLEXERIL) 10 MG tablet    Sig: TAKE 1 TABLET(10 MG) BY MOUTH THREE TIMES DAILY AS NEEDED FOR MUSCLE SPASMS    Dispense:  30 tablet    Refill:  1    Order Specific Question:   Supervising Provider    Answer:   [id number] Doristine Bosworth   PLAN  Meloxicam, tramadol,and flexeril for pain  Chlorthalidone for elevated  BP  Referral to ortho sent - discussed with patient that based on her xrays - surgery may be needed to repair this and alleviate pain. Patient demonstrates understanding.  Patient encouraged to call clinic with any questions, comments, or concerns.   Janeece Agee, NP

## 2019-08-07 ENCOUNTER — Encounter: Payer: Self-pay | Admitting: Registered Nurse

## 2019-08-10 ENCOUNTER — Other Ambulatory Visit: Payer: Self-pay | Admitting: Registered Nurse

## 2019-08-10 DIAGNOSIS — G8929 Other chronic pain: Secondary | ICD-10-CM

## 2019-08-10 DIAGNOSIS — M5441 Lumbago with sciatica, right side: Secondary | ICD-10-CM

## 2019-08-10 NOTE — Telephone Encounter (Signed)
Patient would like to update you on how the referral was going.

## 2019-08-11 NOTE — Telephone Encounter (Signed)
LVM for patient to call back in regards to her concerns from the last visit

## 2019-08-11 NOTE — Telephone Encounter (Signed)
Called pt. To schedule appt. No answer

## 2019-08-12 ENCOUNTER — Other Ambulatory Visit: Payer: Self-pay | Admitting: Registered Nurse

## 2019-08-12 DIAGNOSIS — M87051 Idiopathic aseptic necrosis of right femur: Secondary | ICD-10-CM

## 2019-08-12 MED ORDER — TRAMADOL HCL 50 MG PO TABS: 50.0000 mg | ORAL_TABLET | Freq: Three times a day (TID) | ORAL | 0 refills | Status: DC | PRN

## 2019-08-12 NOTE — Telephone Encounter (Signed)
[name] [name] did we ever receive the information we needed from [facility]     Please Advise.

## 2019-08-14 ENCOUNTER — Encounter: Payer: Self-pay | Admitting: Orthopaedic Surgery

## 2019-08-14 ENCOUNTER — Other Ambulatory Visit: Payer: Self-pay

## 2019-08-14 ENCOUNTER — Ambulatory Visit (INDEPENDENT_AMBULATORY_CARE_PROVIDER_SITE_OTHER): Payer: BLUE CROSS/BLUE SHIELD | Admitting: Orthopaedic Surgery

## 2019-08-14 ENCOUNTER — Ambulatory Visit: Payer: Self-pay

## 2019-08-14 VITALS — Ht 66.0 in | Wt 202.0 lb

## 2019-08-14 DIAGNOSIS — M87051 Idiopathic aseptic necrosis of right femur: Secondary | ICD-10-CM | POA: Diagnosis not present

## 2019-08-14 NOTE — Progress Notes (Signed)
Office Visit Note   Patient: [name] [name]           Date of Birth: Aug 12, 1980           MRN: [id number] Visit Date: 08/14/2019              Requested by: Janeece Agee, NP [address] [place],  [place] [address] PCP: Patient, No Pcp Per   Assessment & Plan: Visit Diagnoses:  1. 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  Based on the findings of femoral head collapse and DJD, we don't feel that she would get any substantial relief from non-operative treatment such as injection although this was offered.  Based on our discussion, she agrees to proceed with a right total hip replacement.  Risks, benefits and possible complications reviewed.  Rehab and recovery discussed.  All questions answered.  Total face to face encounter time was greater than 45 minutes and over half of this time was spent in counseling and/or coordination of care.  Follow-Up Instructions: Return for 2 week postop visit.   Orders:  No orders of the defined types were placed in this encounter.  No orders of the defined types were placed in this encounter.     Procedures: No procedures performed   Clinical Data: No additional findings.   Subjective: Chief Complaint  Patient presents with  . Right Hip - Pain  . Right Knee - Pain    HPI patient is a pleasant 39 year old female who comes in today with right hip pain.  This began approximately 6 months ago.  No known injury but she does note leading up to the onset of pain she was fairly sedentary due to her work being closed from [employer].  During that time she gained quite a bit of weight.  No history of alcoholism or steroid use, but she does admit to drinking 3-4 drinks per day.  The pain she is having is to the right buttocks and radiates into the groin and anterior thigh all the way to the knee.  She occasionally gets pain in the foot.  She has a hard time getting comfortable.   Her pain is aggravated with weightbearing.  She has tried over-the-counter pain medication as well as ice packs and heating pads without relief of symptoms.  No numbness, tingling or burning.no previous cortisone injection or surgical intervention to the right hip.   Review of Systems as detailed in HPI.  All others reviewed and are negative    Objective: Vital Signs: Ht 5\' 6"  (1.676 m)   Wt 202 lb (91.6 kg)   LMP 08/05/2019   BMI 32.60 kg/m   Physical Exam well developed and well nourished female in no acute distress.  Alert and oriented x 3.    Ortho Exam right hip exam shows markedly positive logroll and fadir.  Negative straight leg raise.  She is neurovascularly intact distally.  Right leg is shorter.  Specialty Comments:  No specialty comments available.  Imaging: xrays reviewed by me in canopy reveal severe avascular necrosis to the right femoral head   PMFS History: Patient Active Problem List   Diagnosis Date Noted  . Avascular necrosis of bone of right hip (HCC) 08/05/2019  . Elevated blood pressure reading in office without diagnosis of hypertension 08/05/2019  . Attention deficit hyperactivity disorder (ADHD) 06/22/2015   Past Medical History:  Diagnosis Date  . ADHD (attention deficit  hyperactivity disorder)   . Ectopic pregnancy     Family History  Problem Relation Age of Onset  . Hypertension Father   . Heart disease Father   . Healthy Mother     Past Surgical History:  Procedure Laterality Date  . WISDOM TOOTH EXTRACTION     Social History   Occupational History  . Occupation: Solicitor    Comment: [employer]  . Occupation: blogger    Comment: beer blog for [employer]  Tobacco Use  . Smoking status: Former Research scientist (life sciences)  . Smokeless tobacco: Never Used  Substance and Sexual Activity  . Alcohol use: Yes    Alcohol/week: 0.0 standard drinks    Comment: a drink every now and again  . Drug use: No  . Sexual activity: Yes    Partners: Male     Birth control/protection: None    Comment: trying to become pregnant

## 2019-08-17 ENCOUNTER — Other Ambulatory Visit: Payer: Self-pay

## 2019-08-18 ENCOUNTER — Other Ambulatory Visit: Payer: Self-pay | Admitting: Registered Nurse

## 2019-08-18 DIAGNOSIS — M87051 Idiopathic aseptic necrosis of right femur: Secondary | ICD-10-CM

## 2019-08-18 MED ORDER — OXYCODONE HCL 7.5 MG PO TABS: 7.5000 mg | ORAL_TABLET | Freq: Three times a day (TID) | ORAL | 0 refills | Status: DC | PRN

## 2019-08-18 NOTE — Telephone Encounter (Signed)
Patient is requesting a refill of the following medications: Requested Prescriptions   Pending Prescriptions Disp Refills  . traMADol (ULTRAM) 50 MG tablet [Pharmacy Med Name: TRAMADOL 50MG  TABLETS] 15 tablet     Sig: TAKE 1 TABLET(50 MG) BY MOUTH EVERY 8 HOURS FOR UP TO 5 DAYS AS NEEDED    Date of patient request: 08/18/2019 Last office visit:08/05/2019  Date of last refill: 08/12/2019 Last refill amount: 15 Tablets Follow up time period per chart: No follow up scheduled

## 2019-08-20 NOTE — Telephone Encounter (Signed)
Please advise 

## 2019-08-21 ENCOUNTER — Other Ambulatory Visit: Payer: Self-pay | Admitting: Registered Nurse

## 2019-08-21 DIAGNOSIS — M87051 Idiopathic aseptic necrosis of right femur: Secondary | ICD-10-CM

## 2019-08-21 MED ORDER — OXYCODONE-ACETAMINOPHEN 10-325 MG PO TABS: 1.0000 | ORAL_TABLET | Freq: Three times a day (TID) | ORAL | 0 refills | Status: DC | PRN

## 2019-08-24 ENCOUNTER — Other Ambulatory Visit: Payer: Self-pay | Admitting: Registered Nurse

## 2019-08-24 DIAGNOSIS — M5441 Lumbago with sciatica, right side: Secondary | ICD-10-CM

## 2019-08-24 DIAGNOSIS — G8929 Other chronic pain: Secondary | ICD-10-CM

## 2019-08-24 NOTE — Telephone Encounter (Signed)
Pt requesting refill cyclobenzaprine 10 mg 1 three times daily.

## 2019-08-26 ENCOUNTER — Other Ambulatory Visit: Payer: Self-pay | Admitting: Physician Assistant

## 2019-08-26 MED ORDER — OXYCODONE-ACETAMINOPHEN 5-325 MG PO TABS: 1.0000 | ORAL_TABLET | Freq: Four times a day (QID) | ORAL | 0 refills | Status: DC | PRN

## 2019-08-26 MED ORDER — ASPIRIN EC 81 MG PO TBEC: 81.0000 mg | DELAYED_RELEASE_TABLET | Freq: Two times a day (BID) | ORAL | 0 refills | Status: DC

## 2019-08-26 MED ORDER — ONDANSETRON HCL 4 MG PO TABS: 4.0000 mg | ORAL_TABLET | Freq: Three times a day (TID) | ORAL | 0 refills | Status: DC | PRN

## 2019-08-26 MED ORDER — OXYCODONE HCL ER 10 MG PO T12A: 10.0000 mg | EXTENDED_RELEASE_TABLET | Freq: Two times a day (BID) | ORAL | 0 refills | Status: DC

## 2019-08-26 MED ORDER — METHOCARBAMOL 500 MG PO TABS: 500.0000 mg | ORAL_TABLET | Freq: Two times a day (BID) | ORAL | 0 refills | Status: DC | PRN

## 2019-08-26 MED ORDER — POLYETHYLENE GLYCOL 3350 17 G PO PACK: 17.0000 g | PACK | Freq: Every day | ORAL | 0 refills | Status: DC | PRN

## 2019-08-26 NOTE — Pre-Procedure Instructions (Addendum)
[name] [name]  08/26/2019      [id number] [place], [place] - [address] AT [facility] OF [facility] [address] [place] [address] Phone: [phone] Fax: [phone]  [facility] - [address] [place]), [place] - [address] [place] ([place]) [place] [address] Phone: [phone] Fax: [phone]    Your procedure is scheduled on March 15  Report to [facility] Entrance A  at 7:50A.M.  Call this number if you have problems the morning of surgery:  [phone]   Remember:  Do not eat  after midnight.  You may drink clear liquids until 6:50 A.M. .  Clear liquids allowed are:                    Water, Juice (non-citric and without pulp), Carbonated beverages, Clear Tea, Black Coffee only, Plain Jell-O only, Gatorade and Plain Popsicles only             Enhanced Recovery after Surgery for Orthopedics Enhanced Recovery after Surgery is a protocol used to improve the stress on your body and your recovery after surgery.  Patient Instructions  . The night before surgery:  o No food after midnight. ONLY clear liquids after midnight  .  Marland Kitchen The day of surgery (if you do NOT have diabetes):  o Drink ONE (1) Pre-Surgery Clear Ensure as directed.   o This drink was given to you during your hospital  pre-op appointment visit. o The pre-op nurse will instruct you on the time to drink the ( 7:50 A.M.) Pre-Surgery Ensure depending on your surgery time. o Finish the drink at the designated time by the pre-op nurse.  o Nothing else to drink after completing the  Pre-Surgery Clear Ensure.         If you have questions, please contact your surgeon's office.     Take these medicines the morning of surgery with A SIP OF WATER :               Flexeril (cyclobenzaprine) if needed              Oxycodone or tramadol  if needed         7 days prior to surgery STOP taking any Aspirin (unless otherwise instructed by  your surgeon), Aleve, Naproxen, Ibuprofen, Motrin, Advil, Goody's, BC's, all herbal medications, fish oil, and all vitamins.    Do not wear jewelry, make-up or nail polish.  Do not wear lotions, powders, or perfumes, or deodorant.  Do not shave 48 hours prior to surgery.  Men may shave face and neck.  Do not bring valuables to the hospital.  [facility] is not responsible for any belongings or valuables.  Contacts, dentures or bridgework may not be worn into surgery.  Leave your suitcase in the car.  After surgery it may be brought to your room.  For patients admitted to the hospital, discharge time will be determined by your treatment team.  Patients discharged the day of surgery will not be allowed to drive home.    Special instructions:   [place]- Preparing For Surgery  Before surgery, you can play an important role. Because skin is not sterile, your skin needs to be as free of germs as possible. You can reduce the number of germs on your skin by washing with CHG (chlorahexidine gluconate) Soap before surgery.  CHG is an antiseptic cleaner which kills germs  and bonds with the skin to continue killing germs even after washing.    Oral Hygiene is also important to reduce your risk of infection.  Remember - BRUSH YOUR TEETH THE MORNING OF SURGERY WITH YOUR REGULAR TOOTHPASTE  Please do not use if you have an allergy to CHG or antibacterial soaps. If your skin becomes reddened/irritated stop using the CHG.  Do not shave (including legs and underarms) for at least 48 hours prior to first CHG shower. It is OK to shave your face.  Please follow these instructions carefully.   1. Shower the NIGHT BEFORE SURGERY and the MORNING OF SURGERY with CHG.   2. If you chose to wash your hair, wash your hair first as usual with your normal shampoo.  3. After you shampoo, rinse your hair and body thoroughly to remove the shampoo.  4. Use CHG as you would any other liquid soap. You can apply CHG  directly to the skin and wash gently with a scrungie or a clean washcloth.   5. Apply the CHG Soap to your body ONLY FROM THE NECK DOWN.  Do not use on open wounds or open sores. Avoid contact with your eyes, ears, mouth and genitals (private parts). Wash Face and genitals (private parts)  with your normal soap.  6. Wash thoroughly, paying special attention to the area where your surgery will be performed.  7. Thoroughly rinse your body with warm water from the neck down.  8. DO NOT shower/wash with your normal soap after using and rinsing off the CHG Soap.  9. Pat yourself dry with a CLEAN TOWEL.  10. Wear CLEAN PAJAMAS to bed the night before surgery, wear comfortable clothes the morning of surgery  11. Place CLEAN SHEETS on your bed the night of your first shower and DO NOT SLEEP WITH PETS.    Day of Surgery:  Do not apply any deodorants/lotions.  Please wear clean clothes to the hospital/surgery center.   Remember to brush your teeth WITH YOUR REGULAR TOOTHPASTE.    Please read over the following fact sheets that you were given. Coughing and Deep Breathing and Surgical Site Infection Prevention

## 2019-08-27 ENCOUNTER — Other Ambulatory Visit: Payer: Self-pay

## 2019-08-27 ENCOUNTER — Other Ambulatory Visit: Payer: Self-pay | Admitting: Registered Nurse

## 2019-08-27 ENCOUNTER — Encounter (HOSPITAL_COMMUNITY): Payer: Self-pay

## 2019-08-27 ENCOUNTER — Other Ambulatory Visit (HOSPITAL_COMMUNITY)
Admission: RE | Admit: 2019-08-27 | Discharge: 2019-08-27 | Disposition: A | Discharge status: Home / Self Care | Payer: BLUE CROSS/BLUE SHIELD | Source: Ambulatory Visit | Attending: Orthopaedic Surgery | Admitting: Orthopaedic Surgery

## 2019-08-27 ENCOUNTER — Encounter (HOSPITAL_COMMUNITY)
Admission: RE | Admit: 2019-08-27 | Discharge: 2019-08-27 | Disposition: A | Discharge status: Home / Self Care | Payer: BLUE CROSS/BLUE SHIELD | Source: Ambulatory Visit | Attending: Orthopaedic Surgery | Admitting: Orthopaedic Surgery

## 2019-08-27 ENCOUNTER — Ambulatory Visit (HOSPITAL_COMMUNITY)
Admission: RE | Admit: 2019-08-27 | Discharge: 2019-08-27 | Disposition: A | Discharge status: Home / Self Care | Payer: BLUE CROSS/BLUE SHIELD | Source: Ambulatory Visit | Attending: Physician Assistant | Admitting: Physician Assistant

## 2019-08-27 ENCOUNTER — Telehealth: Payer: Self-pay

## 2019-08-27 DIAGNOSIS — I10 Essential (primary) hypertension: Secondary | ICD-10-CM | POA: Insufficient documentation

## 2019-08-27 DIAGNOSIS — M1611 Unilateral primary osteoarthritis, right hip: Secondary | ICD-10-CM | POA: Diagnosis not present

## 2019-08-27 DIAGNOSIS — Z87891 Personal history of nicotine dependence: Secondary | ICD-10-CM | POA: Insufficient documentation

## 2019-08-27 DIAGNOSIS — Z01818 Encounter for other preprocedural examination: Secondary | ICD-10-CM | POA: Diagnosis not present

## 2019-08-27 DIAGNOSIS — Z20822 Contact with and (suspected) exposure to covid-19: Secondary | ICD-10-CM | POA: Insufficient documentation

## 2019-08-27 HISTORY — DX: Essential (primary) hypertension: I10

## 2019-08-27 HISTORY — DX: Unspecified osteoarthritis, unspecified site: M19.90

## 2019-08-27 LAB — CBC WITH DIFFERENTIAL/PLATELET
Abs Immature Granulocytes: 0.03 10*3/uL (ref 0.00–0.07)
Basophils Absolute: 0.1 10*3/uL (ref 0.0–0.1)
Basophils Relative: 1 %
Eosinophils Absolute: 0.6 10*3/uL — ABNORMAL HIGH (ref 0.0–0.5)
Eosinophils Relative: 6 %
HCT: 40.8 % (ref 36.0–46.0)
Hemoglobin: 13 g/dL (ref 12.0–15.0)
Immature Granulocytes: 0 %
Lymphocytes Relative: 21 %
Lymphs Abs: 2.1 10*3/uL (ref 0.7–4.0)
MCH: 29.3 pg (ref 26.0–34.0)
MCHC: 31.9 g/dL (ref 30.0–36.0)
MCV: 92.1 fL (ref 80.0–100.0)
Monocytes Absolute: 0.9 10*3/uL (ref 0.1–1.0)
Monocytes Relative: 9 %
Neutro Abs: 6.4 10*3/uL (ref 1.7–7.7)
Neutrophils Relative %: 63 %
Platelets: 446 10*3/uL — ABNORMAL HIGH (ref 150–400)
RBC: 4.43 MIL/uL (ref 3.87–5.11)
RDW: 12.6 % (ref 11.5–15.5)
WBC: 10.2 10*3/uL (ref 4.0–10.5)
nRBC: 0 % (ref 0.0–0.2)

## 2019-08-27 LAB — URINALYSIS, ROUTINE W REFLEX MICROSCOPIC
Bilirubin Urine: NEGATIVE
Glucose, UA: NEGATIVE mg/dL
Hgb urine dipstick: NEGATIVE
Ketones, ur: NEGATIVE mg/dL
Nitrite: POSITIVE — AB
Protein, ur: NEGATIVE mg/dL
Specific Gravity, Urine: 1.012 (ref 1.005–1.030)
pH: 6 (ref 5.0–8.0)

## 2019-08-27 LAB — COMPREHENSIVE METABOLIC PANEL
ALT: 14 U/L (ref 0–44)
AST: 18 U/L (ref 15–41)
Albumin: 3.7 g/dL (ref 3.5–5.0)
Alkaline Phosphatase: 85 U/L (ref 38–126)
Anion gap: 11 (ref 5–15)
BUN: 20 mg/dL (ref 6–20)
CO2: 30 mmol/L (ref 22–32)
Calcium: 9.1 mg/dL (ref 8.9–10.3)
Chloride: 96 mmol/L — ABNORMAL LOW (ref 98–111)
Creatinine, Ser: 0.7 mg/dL (ref 0.44–1.00)
GFR calc Af Amer: >60 mL/min (ref >60)
GFR calc non Af Amer: >60 mL/min (ref >60)
Glucose, Bld: 103 mg/dL — ABNORMAL HIGH (ref 70–99)
Potassium: 3.1 mmol/L — ABNORMAL LOW (ref 3.5–5.1)
Sodium: 137 mmol/L (ref 135–145)
Total Bilirubin: <0.1 mg/dL — ABNORMAL LOW (ref 0.3–1.2)
Total Protein: 6.8 g/dL (ref 6.5–8.1)

## 2019-08-27 LAB — TYPE AND SCREEN
ABO/RH(D): B POS
Antibody Screen: NEGATIVE

## 2019-08-27 LAB — SARS CORONAVIRUS 2 (TAT 6-24 HRS): SARS Coronavirus 2: NEGATIVE

## 2019-08-27 LAB — APTT: aPTT: 27 seconds (ref 24–36)

## 2019-08-27 LAB — PROTIME-INR
INR: 0.8 (ref 0.8–1.2)
Prothrombin Time: 10.9 seconds — ABNORMAL LOW (ref 11.4–15.2)

## 2019-08-27 LAB — SURGICAL PCR SCREEN
MRSA, PCR: NEGATIVE
Staphylococcus aureus: NEGATIVE

## 2019-08-27 LAB — ABO/RH: ABO/RH(D): B POS

## 2019-08-27 NOTE — Telephone Encounter (Signed)
Please call in cipro 500 mg bid x 5 days.  Thanks.

## 2019-08-27 NOTE — Progress Notes (Addendum)
PCP - Freddy Finner @[facility]  Primary Care Cardiologist - na  Chest x-ray - 08/27/19 EKG - 08/27/19 Stress Test: na ECHO - na Cardiac Cath - na  Sleep Study - na CPAP -   Fasting Blood Sugar - na Checks Blood Sugar _____ times a day  Blood Thinner Instructions:na Aspirin Instructions:na  ERAS Protcol -yes PRE-SURGERY Ensure -given  COVID TEST- 08/27/19   Anesthesia review: labs  Left message at Dr. 10/27/19 of of abnormal labs. Also IBX  Dr. Warren Danes. Patient denies shortness of breath, fever, cough and chest pain at PAT appointment   All instructions explained to the patient, with a verbal understanding of the material. Patient agrees to go over the instructions while at home for a better understanding. Patient also instructed to self quarantine after being tested for COVID-19. The opportunity to ask questions was provided.

## 2019-08-27 NOTE — Telephone Encounter (Signed)
Ezra Sites Nurse at [facility] called and left VM on Triage phone. UA was abnormal and positive for Nitrates. She just wanted you to be aware. If you need to call her back please call her at [facility] [phone].

## 2019-08-28 ENCOUNTER — Other Ambulatory Visit: Payer: Self-pay

## 2019-08-28 ENCOUNTER — Encounter: Payer: Self-pay | Admitting: Orthopaedic Surgery

## 2019-08-28 ENCOUNTER — Telehealth: Payer: Self-pay

## 2019-08-28 MED ORDER — TRANEXAMIC ACID 1000 MG/10ML IV SOLN
2000.0000 mg | INTRAVENOUS | Status: AC
  Administered 2019-08-31: 2000 mg via TOPICAL
  Filled 2019-08-28: qty 20

## 2019-08-28 MED ORDER — BUPIVACAINE LIPOSOME 1.3 % IJ SUSP
10.0000 mL | INTRAMUSCULAR | Status: DC
  Filled 2019-08-28: qty 10

## 2019-08-28 MED ORDER — TRANEXAMIC ACID-NACL 1000-0.7 MG/100ML-% IV SOLN
1000.0000 mg | INTRAVENOUS | Status: AC
  Administered 2019-08-31: 12:00:00 1000 mg via INTRAVENOUS
  Filled 2019-08-28: qty 100

## 2019-08-28 MED ORDER — CIPROFLOXACIN HCL 500 MG PO TABS: ORAL_TABLET | ORAL | 0 refills | Status: DC

## 2019-08-28 NOTE — Telephone Encounter (Signed)
Patient is requesting a refill of the following medications: Requested Prescriptions   Pending Prescriptions Disp Refills  . traMADol (ULTRAM) 50 MG tablet [Pharmacy Med Name: TRAMADOL 50MG  TABLETS] 15 tablet     Sig: TAKE 1 TABLET(50 MG) BY MOUTH EVERY 8 HOURS FOR UP TO 5 DAYS AS NEEDED    Date of patient request: 08/27/2019 Last office visit: 08/05/2019 Date of last refill: 08/18/19 Last refill amount:  Follow up time period per chart: no appointment scheduled

## 2019-08-28 NOTE — Progress Notes (Addendum)
Anesthesia Chart Review:  Case: [id number] Date/Time: 08/31/19 0939   Procedure: RIGHT TOTAL HIP ARTHROPLASTY ANTERIOR APPROACH (Right Hip)   Anesthesia type: Spinal   Pre-op diagnosis: right hip avascular necrosis   [facility] ROOM 04 / [facility]   Surgeons: Tarry Kos, MD      DISCUSSION: Patient is a 39 year old female scheduled for the above procedure.  History includes former smoker (quit 05/28/17), ADHD, ectopic pregnancy, HTN, arthritis. BMI is consistent with obesity.  UA called to surgeon's office, and Cipro x5 day prescribed.  08/27/2019 presurgical COVID-19 test negative.  Anesthesia team to evaluate on the day of surgery. She also need a pregnancy test on the day of surgery.    VS: BP (!) 143/92   Pulse 91   Temp 36.6 C (Oral)   Resp 18   Ht 5\' 6"  (1.676 m)   Wt 90.1 kg   LMP 08/05/2019   SpO2 99%   BMI 32.05 kg/m   PROVIDERS: 08/07/2019, NP is PCP [facility])   LABS: Labs reviewed: Acceptable for surgery. See DISCUSSION.  (all labs ordered are listed, but only abnormal results are displayed)  Labs Reviewed  CBC WITH DIFFERENTIAL/PLATELET - Abnormal; Notable for the following components:      Result Value   Platelets 446 (*)    Eosinophils Absolute 0.6 (*)    All other components within normal limits  COMPREHENSIVE METABOLIC PANEL - Abnormal; Notable for the following components:   Potassium 3.1 (*)    Chloride 96 (*)    Glucose, Bld 103 (*)    Total Bilirubin <0.1 (*)    All other components within normal limits  PROTIME-INR - Abnormal; Notable for the following components:   Prothrombin Time 10.9 (*)    All other components within normal limits  URINALYSIS, ROUTINE W REFLEX MICROSCOPIC - Abnormal; Notable for the following components:   APPearance HAZY (*)    Nitrite POSITIVE (*)    Leukocytes,Ua TRACE (*)    Bacteria, UA FEW (*)    All other components within normal limits  SURGICAL PCR SCREEN  APTT  TYPE AND SCREEN  ABO/RH      IMAGES: CXR 08/27/19: FINDINGS: Lungs are clear. Heart size and pulmonary vascularity are normal. No adenopathy. No bone lesions. IMPRESSION: No abnormality noted.   EKG: 08/27/19: Normal sinus rhythm Possible Left atrial enlargement Borderline ECG Confirmed by 10/27/19 ([id number]) on 08/27/2019 2:20:13 PM   CV: N/A   Past Medical History:  Diagnosis Date  . ADHD (attention deficit hyperactivity disorder)   . Arthritis   . Ectopic pregnancy   . Hypertension     Past Surgical History:  Procedure Laterality Date  . WISDOM TOOTH EXTRACTION      MEDICATIONS: . amphetamine-dextroamphetamine (ADDERALL XR) 25 MG 24 hr capsule  . amphetamine-dextroamphetamine (ADDERALL XR) 30 MG 24 hr capsule  . chlorthalidone (HYGROTON) 25 MG tablet  . ciprofloxacin (CIPRO) 500 MG tablet  . cyclobenzaprine (FLEXERIL) 10 MG tablet  . meloxicam (MOBIC) 15 MG tablet  . methocarbamol (ROBAXIN) 500 MG tablet  . ondansetron (ZOFRAN) 4 MG tablet  . oxyCODONE (OXYCONTIN) 10 mg 12 hr tablet  . oxyCODONE-acetaminophen (PERCOCET) 10-325 MG tablet  . oxyCODONE-acetaminophen (PERCOCET) 5-325 MG tablet  . polyethylene glycol (MIRALAX) 17 g packet  . traMADol (ULTRAM) 50 MG tablet   No current facility-administered medications for this encounter.   10/27/2019 ON 08/31/2019] bupivacaine liposome (EXPAREL) 1.3 % injection 133 mg  . [START ON 08/31/2019]  tranexamic acid (CYKLOKAPRON) 2,000 mg in sodium chloride 0.9 % 50 mL Topical Application  . [START ON 08/31/2019] tranexamic acid (CYKLOKAPRON) IVPB 1,000 mg    Myra Gianotti, PA-C Surgical Short Stay/Anesthesiology [facility] Phone [phone] [facility] Phone [phone] 08/28/2019 1:54 PM

## 2019-08-28 NOTE — Telephone Encounter (Signed)
Patient called back and I spoke with her about picking up and taking Cipro due to urinalysis.

## 2019-08-28 NOTE — Anesthesia Preprocedure Evaluation (Addendum)
Anesthesia Evaluation  Patient identified by MRN, date of birth, ID band Patient awake    Reviewed: Allergy & Precautions, H&P , NPO status , Patient's Chart, lab work & pertinent test results, reviewed documented beta blocker date and time   Airway Mallampati: I  TM Distance: >3 FB Neck ROM: full    Dental no notable dental hx. (+) Teeth Intact, Dental Advisory Given   Pulmonary neg pulmonary ROS, former smoker,    Pulmonary exam normal breath sounds clear to auscultation       Cardiovascular Exercise Tolerance: Good hypertension, negative cardio ROS   Rhythm:regular Rate:Normal     Neuro/Psych PSYCHIATRIC DISORDERS negative neurological ROS     GI/Hepatic negative GI ROS, Neg liver ROS,   Endo/Other  Morbid obesity  Renal/GU negative Renal ROS  negative genitourinary   Musculoskeletal  (+) Arthritis , Osteoarthritis,    Abdominal   Peds  Hematology negative hematology ROS (+)   Anesthesia Other Findings   Reproductive/Obstetrics negative OB ROS                            Anesthesia Physical Anesthesia Plan  ASA: II  Anesthesia Plan: Spinal and MAC   Post-op Pain Management:    Induction:   PONV Risk Score and Plan: 3 and Ondansetron  Airway Management Planned: Nasal Cannula, Simple Face Mask and Mask  Additional Equipment:   Intra-op Plan:   Post-operative Plan:   Informed Consent: I have reviewed the patients History and Physical, chart, labs and discussed the procedure including the risks, benefits and alternatives for the proposed anesthesia with the patient or authorized representative who has indicated his/her understanding and acceptance.     Dental Advisory Given  Plan Discussed with: CRNA, Anesthesiologist and Surgeon  Anesthesia Plan Comments: (PAT note written 08/28/2019 by Myra Gianotti, PA-C. )      Anesthesia Quick Evaluation

## 2019-08-28 NOTE — Telephone Encounter (Signed)
Called into pharm  

## 2019-08-28 NOTE — Telephone Encounter (Signed)
I have tried calling patient to advise on message below. She has not answered. Could you please try again this PM. She is having SU Monday. Thank you.

## 2019-08-28 NOTE — Telephone Encounter (Signed)
I have been trying to reach patient no answer. LMOM. Per Herold Harms, she wanted me to let patient  know she has called in All her Rx for Monday's surgery. Also Dr. Roda Shutters wants patient to start on Cipro for 5 days ASAP.   Will try again later.

## 2019-08-28 NOTE — Telephone Encounter (Signed)
Patient called and spoke with Sherrie. Advised about antibiotics.

## 2019-08-28 NOTE — Telephone Encounter (Signed)
I called patient again no answer. LMOM.

## 2019-08-28 NOTE — Telephone Encounter (Signed)
noted 

## 2019-08-30 ENCOUNTER — Encounter: Payer: Self-pay | Admitting: Registered Nurse

## 2019-08-31 ENCOUNTER — Encounter (HOSPITAL_COMMUNITY): Payer: Self-pay | Admitting: Orthopaedic Surgery

## 2019-08-31 ENCOUNTER — Other Ambulatory Visit: Payer: Self-pay

## 2019-08-31 ENCOUNTER — Ambulatory Visit (HOSPITAL_COMMUNITY): Payer: BLUE CROSS/BLUE SHIELD

## 2019-08-31 ENCOUNTER — Encounter (HOSPITAL_COMMUNITY)
Admission: RE | Disposition: A | Discharge status: Home / Self Care | Payer: Self-pay | Source: Home / Self Care | Attending: Orthopaedic Surgery

## 2019-08-31 ENCOUNTER — Ambulatory Visit (HOSPITAL_COMMUNITY): Payer: BLUE CROSS/BLUE SHIELD | Admitting: Vascular Surgery

## 2019-08-31 ENCOUNTER — Telehealth: Payer: Self-pay

## 2019-08-31 ENCOUNTER — Observation Stay (HOSPITAL_COMMUNITY): Payer: BLUE CROSS/BLUE SHIELD

## 2019-08-31 ENCOUNTER — Observation Stay (HOSPITAL_COMMUNITY)
Admission: RE | Admit: 2019-08-31 | Discharge: 2019-08-31 | Disposition: A | Discharge status: Home / Self Care | Payer: BLUE CROSS/BLUE SHIELD | Attending: Orthopaedic Surgery | Admitting: Orthopaedic Surgery

## 2019-08-31 ENCOUNTER — Ambulatory Visit (HOSPITAL_COMMUNITY): Payer: BLUE CROSS/BLUE SHIELD | Admitting: Anesthesiology

## 2019-08-31 DIAGNOSIS — Z87891 Personal history of nicotine dependence: Secondary | ICD-10-CM | POA: Insufficient documentation

## 2019-08-31 DIAGNOSIS — Z6832 Body mass index (BMI) 32.0-32.9, adult: Secondary | ICD-10-CM | POA: Insufficient documentation

## 2019-08-31 DIAGNOSIS — Z419 Encounter for procedure for purposes other than remedying health state, unspecified: Secondary | ICD-10-CM

## 2019-08-31 DIAGNOSIS — Z96649 Presence of unspecified artificial hip joint: Secondary | ICD-10-CM

## 2019-08-31 DIAGNOSIS — M12851 Other specific arthropathies, not elsewhere classified, right hip: Secondary | ICD-10-CM

## 2019-08-31 DIAGNOSIS — M87051 Idiopathic aseptic necrosis of right femur: Secondary | ICD-10-CM

## 2019-08-31 DIAGNOSIS — Z96641 Presence of right artificial hip joint: Secondary | ICD-10-CM | POA: Diagnosis not present

## 2019-08-31 DIAGNOSIS — Z886 Allergy status to analgesic agent status: Secondary | ICD-10-CM | POA: Diagnosis not present

## 2019-08-31 DIAGNOSIS — Z471 Aftercare following joint replacement surgery: Secondary | ICD-10-CM | POA: Diagnosis not present

## 2019-08-31 DIAGNOSIS — F909 Attention-deficit hyperactivity disorder, unspecified type: Secondary | ICD-10-CM | POA: Diagnosis not present

## 2019-08-31 DIAGNOSIS — Z79899 Other long term (current) drug therapy: Secondary | ICD-10-CM | POA: Insufficient documentation

## 2019-08-31 DIAGNOSIS — M879 Osteonecrosis, unspecified: Principal | ICD-10-CM | POA: Insufficient documentation

## 2019-08-31 DIAGNOSIS — I10 Essential (primary) hypertension: Secondary | ICD-10-CM | POA: Diagnosis not present

## 2019-08-31 DIAGNOSIS — Z791 Long term (current) use of non-steroidal anti-inflammatories (NSAID): Secondary | ICD-10-CM | POA: Insufficient documentation

## 2019-08-31 DIAGNOSIS — M1990 Unspecified osteoarthritis, unspecified site: Secondary | ICD-10-CM | POA: Diagnosis not present

## 2019-08-31 HISTORY — PX: TOTAL HIP ARTHROPLASTY: SHX124

## 2019-08-31 LAB — POCT PREGNANCY, URINE: Preg Test, Ur: NEGATIVE

## 2019-08-31 SURGERY — ARTHROPLASTY, HIP, TOTAL, ANTERIOR APPROACH
Anesthesia: Monitor Anesthesia Care | Site: Hip | Laterality: Right

## 2019-08-31 MED ORDER — MIDAZOLAM HCL 5 MG/5ML IJ SOLN
INTRAMUSCULAR | Status: DC | PRN
  Administered 2019-08-31: 2 mg via INTRAVENOUS

## 2019-08-31 MED ORDER — SORBITOL 70 % SOLN
30.0000 mL | Freq: Every day | Status: DC | PRN
  Filled 2019-08-31: qty 30

## 2019-08-31 MED ORDER — LACTATED RINGERS IV BOLUS: 250.0000 mL | Freq: Once | INTRAVENOUS | Status: DC

## 2019-08-31 MED ORDER — ONDANSETRON HCL 4 MG/2ML IJ SOLN: 4.0000 mg | Freq: Four times a day (QID) | INTRAMUSCULAR | Status: DC | PRN

## 2019-08-31 MED ORDER — CEFAZOLIN SODIUM-DEXTROSE 2-4 GM/100ML-% IV SOLN
2.0000 g | Freq: Four times a day (QID) | INTRAVENOUS | Status: DC
  Administered 2019-08-31: 2 g via INTRAVENOUS
  Filled 2019-08-31: qty 100

## 2019-08-31 MED ORDER — HYDROMORPHONE HCL 1 MG/ML IJ SOLN
0.5000 mg | INTRAMUSCULAR | Status: DC | PRN
  Filled 2019-08-31: qty 1

## 2019-08-31 MED ORDER — OXYCODONE HCL ER 10 MG PO T12A: 10.0000 mg | EXTENDED_RELEASE_TABLET | Freq: Two times a day (BID) | ORAL | Status: DC

## 2019-08-31 MED ORDER — AMPHETAMINE-DEXTROAMPHET ER 10 MG PO CP24: 20.0000 mg | ORAL_CAPSULE | ORAL | Status: DC

## 2019-08-31 MED ORDER — OXYCODONE-ACETAMINOPHEN 10-325 MG PO TABS: 1.0000 | ORAL_TABLET | ORAL | Status: DC | PRN

## 2019-08-31 MED ORDER — CELECOXIB 200 MG PO CAPS: 200.0000 mg | ORAL_CAPSULE | Freq: Two times a day (BID) | ORAL | Status: DC

## 2019-08-31 MED ORDER — VANCOMYCIN HCL 1000 MG IV SOLR
INTRAVENOUS | Status: AC
  Filled 2019-08-31: qty 1000

## 2019-08-31 MED ORDER — CHLORHEXIDINE GLUCONATE 4 % EX LIQD: 60.0000 mL | Freq: Once | CUTANEOUS | Status: DC

## 2019-08-31 MED ORDER — POLYETHYLENE GLYCOL 3350 17 G PO PACK: 17.0000 g | PACK | Freq: Every day | ORAL | Status: DC | PRN

## 2019-08-31 MED ORDER — MENTHOL 3 MG MT LOZG: 1.0000 | LOZENGE | OROMUCOSAL | Status: DC | PRN

## 2019-08-31 MED ORDER — PHENOL 1.4 % MT LIQD: 1.0000 | OROMUCOSAL | Status: DC | PRN

## 2019-08-31 MED ORDER — TRANEXAMIC ACID-NACL 1000-0.7 MG/100ML-% IV SOLN
1000.0000 mg | Freq: Once | INTRAVENOUS | Status: AC
  Administered 2019-08-31: 1000 mg via INTRAVENOUS
  Filled 2019-08-31 (×2): qty 100

## 2019-08-31 MED ORDER — DEXAMETHASONE SODIUM PHOSPHATE 10 MG/ML IJ SOLN: 10.0000 mg | Freq: Once | INTRAMUSCULAR | Status: DC

## 2019-08-31 MED ORDER — CEFAZOLIN SODIUM-DEXTROSE 2-4 GM/100ML-% IV SOLN
INTRAVENOUS | Status: AC
  Filled 2019-08-31: qty 100

## 2019-08-31 MED ORDER — ONDANSETRON HCL 4 MG/2ML IJ SOLN: 4.0000 mg | Freq: Once | INTRAMUSCULAR | Status: DC | PRN

## 2019-08-31 MED ORDER — OXYCODONE HCL 5 MG PO TABS: 5.0000 mg | ORAL_TABLET | Freq: Three times a day (TID) | ORAL | 0 refills | Status: DC | PRN

## 2019-08-31 MED ORDER — METOCLOPRAMIDE HCL 5 MG PO TABS: 5.0000 mg | ORAL_TABLET | Freq: Three times a day (TID) | ORAL | Status: DC | PRN

## 2019-08-31 MED ORDER — OXYCODONE HCL 5 MG PO TABS: 5.0000 mg | ORAL_TABLET | ORAL | Status: DC | PRN

## 2019-08-31 MED ORDER — SODIUM CHLORIDE 0.9 % IR SOLN
Status: DC | PRN
  Administered 2019-08-31: 3000 mL

## 2019-08-31 MED ORDER — BUPIVACAINE HCL (PF) 0.25 % IJ SOLN
INTRAMUSCULAR | Status: AC
  Filled 2019-08-31: qty 30

## 2019-08-31 MED ORDER — METHOCARBAMOL 500 MG PO TABS
ORAL_TABLET | ORAL | Status: AC
  Administered 2019-08-31: 750 mg via ORAL
  Filled 2019-08-31: qty 2

## 2019-08-31 MED ORDER — OXYCODONE HCL 5 MG PO TABS: 5.0000 mg | ORAL_TABLET | Freq: Once | ORAL | Status: DC | PRN

## 2019-08-31 MED ORDER — METHOCARBAMOL 1000 MG/10ML IJ SOLN
500.0000 mg | Freq: Four times a day (QID) | INTRAVENOUS | Status: DC | PRN
  Filled 2019-08-31: qty 5

## 2019-08-31 MED ORDER — CIPROFLOXACIN HCL 500 MG PO TABS
500.0000 mg | ORAL_TABLET | Freq: Two times a day (BID) | ORAL | Status: DC
  Filled 2019-08-31 (×2): qty 1

## 2019-08-31 MED ORDER — VANCOMYCIN HCL 1 G IV SOLR
INTRAVENOUS | Status: DC | PRN
  Administered 2019-08-31: 1000 mg via TOPICAL

## 2019-08-31 MED ORDER — PROPOFOL 500 MG/50ML IV EMUL
INTRAVENOUS | Status: DC | PRN
  Administered 2019-08-31: 100 ug/kg/min via INTRAVENOUS

## 2019-08-31 MED ORDER — LACTATED RINGERS IV BOLUS
500.0000 mL | Freq: Once | INTRAVENOUS | Status: AC
  Administered 2019-08-31: 500 mL via INTRAVENOUS

## 2019-08-31 MED ORDER — ONDANSETRON HCL 4 MG/2ML IJ SOLN
INTRAMUSCULAR | Status: AC
  Filled 2019-08-31: qty 2

## 2019-08-31 MED ORDER — CHLORTHALIDONE 25 MG PO TABS
25.0000 mg | ORAL_TABLET | Freq: Every day | ORAL | Status: DC
  Administered 2019-08-31: 25 mg via ORAL
  Filled 2019-08-31: qty 1

## 2019-08-31 MED ORDER — SODIUM CHLORIDE 0.9 % IV SOLN: INTRAVENOUS | Status: DC

## 2019-08-31 MED ORDER — ALUM & MAG HYDROXIDE-SIMETH 200-200-20 MG/5ML PO SUSP: 30.0000 mL | ORAL | Status: DC | PRN

## 2019-08-31 MED ORDER — DIPHENHYDRAMINE HCL 12.5 MG/5ML PO ELIX
25.0000 mg | ORAL_SOLUTION | ORAL | Status: DC | PRN
  Filled 2019-08-31: qty 10

## 2019-08-31 MED ORDER — AMPHETAMINE-DEXTROAMPHET ER 5 MG PO CP24: 25.0000 mg | ORAL_CAPSULE | ORAL | Status: DC

## 2019-08-31 MED ORDER — AMPHETAMINE-DEXTROAMPHET ER 10 MG PO CP24: 30.0000 mg | ORAL_CAPSULE | Freq: Every day | ORAL | Status: DC

## 2019-08-31 MED ORDER — AMPHETAMINE-DEXTROAMPHET ER 5 MG PO CP24: 5.0000 mg | ORAL_CAPSULE | ORAL | Status: DC

## 2019-08-31 MED ORDER — FENTANYL CITRATE (PF) 100 MCG/2ML IJ SOLN: 25.0000 ug | INTRAMUSCULAR | Status: DC | PRN

## 2019-08-31 MED ORDER — METHOCARBAMOL 500 MG PO TABS: 750.0000 mg | ORAL_TABLET | Freq: Four times a day (QID) | ORAL | Status: DC | PRN

## 2019-08-31 MED ORDER — ONDANSETRON HCL 4 MG/2ML IJ SOLN
INTRAMUSCULAR | Status: DC | PRN
  Administered 2019-08-31: 4 mg via INTRAVENOUS

## 2019-08-31 MED ORDER — OXYCODONE HCL 5 MG PO TABS
ORAL_TABLET | ORAL | Status: AC
  Filled 2019-08-31: qty 2

## 2019-08-31 MED ORDER — 0.9 % SODIUM CHLORIDE (POUR BTL) OPTIME
TOPICAL | Status: DC | PRN
  Administered 2019-08-31: 1000 mL

## 2019-08-31 MED ORDER — OXYCODONE-ACETAMINOPHEN 5-325 MG PO TABS: 1.0000 | ORAL_TABLET | ORAL | Status: DC | PRN

## 2019-08-31 MED ORDER — ASPIRIN 81 MG PO CHEW: 81.0000 mg | CHEWABLE_TABLET | Freq: Two times a day (BID) | ORAL | Status: DC

## 2019-08-31 MED ORDER — ACETAMINOPHEN 160 MG/5ML PO SOLN: 325.0000 mg | ORAL | Status: DC | PRN

## 2019-08-31 MED ORDER — OXYCODONE HCL 5 MG PO TABS: 10.0000 mg | ORAL_TABLET | ORAL | Status: DC | PRN

## 2019-08-31 MED ORDER — SODIUM CHLORIDE 0.9% FLUSH
INTRAVENOUS | Status: DC | PRN
  Administered 2019-08-31 (×2): 10 mL

## 2019-08-31 MED ORDER — MIDAZOLAM HCL 2 MG/2ML IJ SOLN
INTRAMUSCULAR | Status: AC
  Filled 2019-08-31: qty 2

## 2019-08-31 MED ORDER — ACETAMINOPHEN 325 MG PO TABS: 325.0000 mg | ORAL_TABLET | Freq: Four times a day (QID) | ORAL | Status: DC | PRN

## 2019-08-31 MED ORDER — CEFAZOLIN SODIUM-DEXTROSE 2-4 GM/100ML-% IV SOLN
2.0000 g | INTRAVENOUS | Status: AC
  Administered 2019-08-31: 12:00:00 2 g via INTRAVENOUS

## 2019-08-31 MED ORDER — FENTANYL CITRATE (PF) 250 MCG/5ML IJ SOLN
INTRAMUSCULAR | Status: AC
  Filled 2019-08-31: qty 5

## 2019-08-31 MED ORDER — OXYCODONE HCL 5 MG/5ML PO SOLN: 5.0000 mg | Freq: Once | ORAL | Status: DC | PRN

## 2019-08-31 MED ORDER — DOCUSATE SODIUM 100 MG PO CAPS: 100.0000 mg | ORAL_CAPSULE | Freq: Two times a day (BID) | ORAL | Status: DC

## 2019-08-31 MED ORDER — BUPIVACAINE-EPINEPHRINE 0.25% -1:200000 IJ SOLN
INTRAMUSCULAR | Status: DC | PRN
  Administered 2019-08-31: 20 mL

## 2019-08-31 MED ORDER — OXYCODONE HCL 5 MG PO TABS
5.0000 mg | ORAL_TABLET | ORAL | Status: DC | PRN
  Administered 2019-08-31: 10 mg via ORAL

## 2019-08-31 MED ORDER — BUPIVACAINE IN DEXTROSE 0.75-8.25 % IT SOLN
INTRATHECAL | Status: DC | PRN
  Administered 2019-08-31: 1.8 mL via INTRATHECAL

## 2019-08-31 MED ORDER — FENTANYL CITRATE (PF) 100 MCG/2ML IJ SOLN
INTRAMUSCULAR | Status: DC | PRN
  Administered 2019-08-31 (×2): 50 ug via INTRAVENOUS

## 2019-08-31 MED ORDER — OXYCODONE HCL ER 10 MG PO T12A: 10.0000 mg | EXTENDED_RELEASE_TABLET | Freq: Two times a day (BID) | ORAL | 0 refills | Status: AC

## 2019-08-31 MED ORDER — GABAPENTIN 300 MG PO CAPS
300.0000 mg | ORAL_CAPSULE | Freq: Three times a day (TID) | ORAL | Status: DC
  Administered 2019-08-31: 300 mg via ORAL
  Filled 2019-08-31: qty 1

## 2019-08-31 MED ORDER — ACETAMINOPHEN 500 MG PO TABS
1000.0000 mg | ORAL_TABLET | Freq: Four times a day (QID) | ORAL | Status: DC
  Administered 2019-08-31: 1000 mg via ORAL
  Filled 2019-08-31: qty 2

## 2019-08-31 MED ORDER — MAGNESIUM CITRATE PO SOLN: 1.0000 | Freq: Once | ORAL | Status: DC | PRN

## 2019-08-31 MED ORDER — ACETAMINOPHEN 325 MG PO TABS: 325.0000 mg | ORAL_TABLET | ORAL | Status: DC | PRN

## 2019-08-31 MED ORDER — EPINEPHRINE PF 1 MG/ML IJ SOLN
INTRAMUSCULAR | Status: AC
  Filled 2019-08-31: qty 1

## 2019-08-31 MED ORDER — MEPERIDINE HCL 25 MG/ML IJ SOLN: 6.2500 mg | INTRAMUSCULAR | Status: DC | PRN

## 2019-08-31 MED ORDER — POVIDONE-IODINE 10 % EX SWAB
2.0000 "application " | Freq: Once | CUTANEOUS | Status: AC
  Administered 2019-08-31: 2 via TOPICAL

## 2019-08-31 MED ORDER — PHENYLEPHRINE HCL-NACL 10-0.9 MG/250ML-% IV SOLN
INTRAVENOUS | Status: DC | PRN
  Administered 2019-08-31: 10 ug/min via INTRAVENOUS

## 2019-08-31 MED ORDER — ONDANSETRON HCL 4 MG PO TABS: 4.0000 mg | ORAL_TABLET | Freq: Four times a day (QID) | ORAL | Status: DC | PRN

## 2019-08-31 MED ORDER — METOCLOPRAMIDE HCL 5 MG/ML IJ SOLN: 5.0000 mg | Freq: Three times a day (TID) | INTRAMUSCULAR | Status: DC | PRN

## 2019-08-31 MED ORDER — SODIUM CHLORIDE 0.9 % IV SOLN: INTRAVENOUS | Status: DC | PRN

## 2019-08-31 MED ORDER — LACTATED RINGERS IV SOLN: INTRAVENOUS | Status: DC

## 2019-08-31 SURGICAL SUPPLY — 61 items
ACETAB CUP W GRIPTION 54MM (Plate) ×1 IMPLANT
ACETAB CUP W/GRIPTION 54 (Plate) ×2 IMPLANT
BAG DECANTER FOR FLEXI CONT (MISCELLANEOUS) ×3 IMPLANT
CELLS DAT CNTRL 66122 CELL SVR (MISCELLANEOUS) IMPLANT
COVER PERINEAL POST (MISCELLANEOUS) ×3 IMPLANT
COVER SURGICAL LIGHT HANDLE (MISCELLANEOUS) ×3 IMPLANT
COVER WAND RF STERILE (DRAPES) ×3 IMPLANT
CUP ACETAB W/GRIPTION 54 (Plate) ×1 IMPLANT
DERMABOND ADHESIVE PROPEN (GAUZE/BANDAGES/DRESSINGS) ×2
DERMABOND ADVANCED .7 DNX6 (GAUZE/BANDAGES/DRESSINGS) ×1 IMPLANT
DRAPE C-ARM 42X72 X-RAY (DRAPES) ×3 IMPLANT
DRAPE POUCH INSTRU U-SHP 10X18 (DRAPES) ×3 IMPLANT
DRAPE STERI IOBAN 125X83 (DRAPES) ×3 IMPLANT
DRAPE U-SHAPE 47X51 STRL (DRAPES) ×6 IMPLANT
DRSG AQUACEL AG ADV 3.5X10 (GAUZE/BANDAGES/DRESSINGS) ×3 IMPLANT
DURAPREP 26ML APPLICATOR (WOUND CARE) ×6 IMPLANT
ELECT BLADE 4.0 EZ CLEAN MEGAD (MISCELLANEOUS) ×3
ELECT REM PT RETURN 9FT ADLT (ELECTROSURGICAL) ×3
ELECTRODE BLDE 4.0 EZ CLN MEGD (MISCELLANEOUS) ×1 IMPLANT
ELECTRODE REM PT RTRN 9FT ADLT (ELECTROSURGICAL) ×1 IMPLANT
GLOVE BIOGEL PI IND STRL 7.0 (GLOVE) ×1 IMPLANT
GLOVE BIOGEL PI INDICATOR 7.0 (GLOVE) ×2
GLOVE ECLIPSE 7.0 STRL STRAW (GLOVE) ×6 IMPLANT
GLOVE SKINSENSE NS SZ7.5 (GLOVE) ×2
GLOVE SKINSENSE STRL SZ7.5 (GLOVE) ×1 IMPLANT
GLOVE SURG SYN 7.5  E (GLOVE) ×12
GLOVE SURG SYN 7.5 E (GLOVE) ×4 IMPLANT
GOWN STRL REIN XL XLG (GOWN DISPOSABLE) ×3 IMPLANT
GOWN STRL REUS W/ TWL LRG LVL3 (GOWN DISPOSABLE) IMPLANT
GOWN STRL REUS W/ TWL XL LVL3 (GOWN DISPOSABLE) ×1 IMPLANT
GOWN STRL REUS W/TWL LRG LVL3 (GOWN DISPOSABLE)
GOWN STRL REUS W/TWL XL LVL3 (GOWN DISPOSABLE) ×3
HANDPIECE INTERPULSE COAX TIP (DISPOSABLE) ×3
HEAD CERAMIC DELTA 36 PLUS 1.5 (Hips) ×3 IMPLANT
HOOD PEEL AWAY FLYTE STAYCOOL (MISCELLANEOUS) ×6 IMPLANT
IV NS IRRIG 3000ML ARTHROMATIC (IV SOLUTION) ×3 IMPLANT
KIT BASIN OR (CUSTOM PROCEDURE TRAY) ×3 IMPLANT
LINER NEUTRAL 54X36MM PLUS 4 (Hips) ×3 IMPLANT
MARKER SKIN DUAL TIP RULER LAB (MISCELLANEOUS) ×3 IMPLANT
NEEDLE SPNL 18GX3.5 QUINCKE PK (NEEDLE) ×3 IMPLANT
PACK TOTAL JOINT (CUSTOM PROCEDURE TRAY) ×3 IMPLANT
PACK UNIVERSAL I (CUSTOM PROCEDURE TRAY) ×3 IMPLANT
RTRCTR WOUND ALEXIS 18CM MED (MISCELLANEOUS)
SAW OSC TIP CART 19.5X105X1.3 (SAW) ×3 IMPLANT
SCREW 6.5MMX30MM (Screw) ×3 IMPLANT
SET HNDPC FAN SPRY TIP SCT (DISPOSABLE) ×1 IMPLANT
STAPLER VISISTAT 35W (STAPLE) IMPLANT
STEM FEM ACTIS STD SZ4 (Stem) ×3 IMPLANT
SUT ETHIBOND 2 V 37 (SUTURE) ×3 IMPLANT
SUT VIC AB 0 CT1 27 (SUTURE) ×3
SUT VIC AB 0 CT1 27XBRD ANBCTR (SUTURE) ×1 IMPLANT
SUT VIC AB 1 CTX 36 (SUTURE) ×3
SUT VIC AB 1 CTX36XBRD ANBCTR (SUTURE) ×1 IMPLANT
SUT VIC AB 2-0 CT1 27 (SUTURE) ×9
SUT VIC AB 2-0 CT1 TAPERPNT 27 (SUTURE) ×3 IMPLANT
SYR 50ML LL SCALE MARK (SYRINGE) ×3 IMPLANT
TOWEL GREEN STERILE (TOWEL DISPOSABLE) ×3 IMPLANT
TRAY CATH 16FR W/PLASTIC CATH (SET/KITS/TRAYS/PACK) IMPLANT
TRAY FOLEY W/BAG SLVR 16FR (SET/KITS/TRAYS/PACK) ×3
TRAY FOLEY W/BAG SLVR 16FR ST (SET/KITS/TRAYS/PACK) ×1 IMPLANT
YANKAUER SUCT BULB TIP NO VENT (SUCTIONS) ×3 IMPLANT

## 2019-08-31 NOTE — Evaluation (Signed)
Physical Therapy Evaluation Patient Details Name: [name] [name] MRN: [id number] DOB: [birth year] Today's Date: 08/31/2019   History of Present Illness  [name] [name] is a 39 y.o. femalewith PMH significant for ADHD, HTN who presents for surgical treatment of right hip avascular necrosis.  S/P direct anterior approach R THA.  Clinical Impression  Pt admitted with/for R THA.  Pt and significant other have been instructed in necessary education to get home safely.  HEP has been initiated with each completed or discussed/demonstrated.  RW ordered     Follow Up Recommendations Follow surgeon's recommendation for DC plan and follow-up therapies;Supervision/Assistance - 24 hour;Supervision - Intermittent    Equipment Recommendations  Rolling walker with 5" wheels    Recommendations for Other Services       Precautions / Restrictions Precautions Precautions: None(no hip precautions) Restrictions Weight Bearing Restrictions: No      Mobility  Bed Mobility Overal bed mobility: Needs Assistance Bed Mobility: Supine to Sit;Sit to Supine     Supine to sit: Supervision Sit to supine: Supervision      Transfers Overall transfer level: Needs assistance   Transfers: Sit to/from Stand Sit to Stand: Min guard         General transfer comment: cues for hand placement  Ambulation/Gait Ambulation/Gait assistance: Supervision Gait Distance (Feet): 150 Feet Assistive device: Rolling walker (2 wheeled) Gait Pattern/deviations: Step-through pattern   Gait velocity interpretation: <1.8 ft/sec, indicate of risk for recurrent falls General Gait Details: cues for sequencing.  Generally steady.  Stairs Stairs: Yes Stairs assistance: Min guard Stair Management: Step to pattern;Backwards;With walker Number of Stairs: 4 General stair comments: safe with RW and assist of significant other.  Wheelchair Mobility    Modified Rankin (Stroke Patients Only)       Balance Overall  balance assessment: No apparent balance deficits (not formally assessed)                                           Pertinent Vitals/Pain Pain Assessment: Faces Faces Pain Scale: Hurts little more Pain Intervention(s): Premedicated before session    [place] expects to be discharged to:: Private residence Living Arrangements: Spouse/significant other Available Help at Discharge: Family;Available 24 hours/day(for about a week or more) Type of Home: House Home Access: Stairs to enter Entrance Stairs-Rails: None Entrance Stairs-Number of Steps: 3 Home Layout: One level Home Equipment: Bedside commode      Prior Function Level of Independence: Independent               Hand Dominance        Extremity/Trunk Assessment   Upper Extremity Assessment Upper Extremity Assessment: Overall WFL for tasks assessed    Lower Extremity Assessment Lower Extremity Assessment: Overall WFL for tasks assessed(R mildly weak  post surgery)       Communication   Communication: No difficulties  Cognition Arousal/Alertness: Awake/alert Behavior During Therapy: WFL for tasks assessed/performed Overall Cognitive Status: Within Functional Limits for tasks assessed                                        General Comments      Exercises Total Joint Exercises Ankle Circles/Pumps: AROM;Both;10 reps;Supine Quad Sets: AROM;Both;5 reps;Supine Short Arc Quad: Other (comment)(discussed) Heel Slides: AROM;5 reps;Right;Supine Hip ABduction/ADduction: Other (comment)(discussed/demo'd) Long  Arc Quad: Other (comment)(discussed/demo'd)   Assessment/Plan    PT Assessment All further PT needs can be met in the next venue of care  PT Problem List Decreased activity tolerance;Decreased mobility;Decreased knowledge of use of DME;Decreased knowledge of precautions       PT Treatment Interventions      PT Goals (Current goals can be found in the  Care Plan section)  Acute Rehab PT Goals Patient Stated Goal: home today PT Goal Formulation: All assessment and education complete, DC therapy    Frequency     Barriers to discharge        Co-evaluation               AM-PAC PT "6 Clicks" Mobility  Outcome Measure Help needed turning from your back to your side while in a flat bed without using bedrails?: A Little Help needed moving from lying on your back to sitting on the side of a flat bed without using bedrails?: A Little Help needed moving to and from a bed to a chair (including a wheelchair)?: A Little Help needed standing up from a chair using your arms (e.g., wheelchair or bedside chair)?: A Little Help needed to walk in hospital room?: A Little Help needed climbing 3-5 steps with a railing? : A Little 6 Click Score: 18    End of Session   Activity Tolerance: Patient tolerated treatment well Patient left: in bed;with call bell/phone within reach;with family/visitor present Nurse Communication: Mobility status PT Visit Diagnosis: Other abnormalities of gait and mobility (R26.89);Pain Pain - Right/Left: Right Pain - part of body: Hip    Time: 5449-2010 PT Time Calculation (min) (ACUTE ONLY): 29 min   Charges:   PT Evaluation $PT Eval Moderate Complexity: 1 Mod PT Treatments $Gait Training: 8-22 mins        08/31/2019  Ginger Carne., PT Acute Rehabilitation Services [phone]  (pager) [phone]  (office)  [name] [name] 08/31/2019, 6:20 PM

## 2019-08-31 NOTE — Telephone Encounter (Signed)
Please Advise

## 2019-08-31 NOTE — Op Note (Signed)
RIGHT TOTAL HIP ARTHROPLASTY ANTERIOR APPROACH  Procedure Note [name] [name]   [id number]  Pre-op Diagnosis: right hip avascular necrosis     Post-op Diagnosis: same   Operative Procedures  1. Total hip replacement; Right hip; uncemented [id number]   Personnel  Surgeon(s): Leandrew Koyanagi, MD  Assist: Madalyn Rob, PA-C; necessary for the timely completion of procedure and due to complexity of procedure.   Anesthesia: spinal  Prosthesis: Depuy Acetabulum: Pinnacle 54 mm Femur: Actis 4 STD Head: 36 mm size: +1.5 Liner: +4 neutral Bearing Type: ceramic on poly  Total Hip Arthroplasty (Anterior Approach) Op Note:  After informed consent was obtained and the operative extremity marked in the holding area, the patient was brought back to the operating room and placed supine on the HANA table. Next, the operative extremity was prepped and draped in normal sterile fashion. Surgical timeout occurred verifying patient identification, surgical site, surgical procedure and administration of antibiotics.  A modified anterior Smith-Peterson approach to the hip was performed, using the interval between tensor fascia lata and sartorius.  Dissection was carried bluntly down onto the anterior hip capsule. The lateral femoral circumflex vessels were identified and coagulated. A capsulotomy was performed and the capsular flaps tagged for later repair.  The neck osteotomy was performed. The femoral head was removed, the acetabular rim was cleared of soft tissue and attention was turned to reaming the acetabulum.  Sequential reaming was performed under fluoroscopic guidance. We reamed to a size 53 mm, and then impacted the acetabular shell. The liner was then placed after irrigation and attention turned to the femur.  After placing the femoral hook, the leg was taken to externally rotated, extended and adducted position taking care to perform soft tissue releases to allow for adequate mobilization  of the femur. Soft tissue was cleared from the shoulder of the greater trochanter and the hook elevator used to improve exposure of the proximal femur. Sequential broaching performed up to a size 4. Trial neck and head were placed. The leg was brought back up to neutral and the construct reduced. The position and sizing of components, offset and leg lengths were checked using fluoroscopy. Stability of the construct was checked in extension and external rotation without any subluxation or impingement of prosthesis. We dislocated the prosthesis, dropped the leg back into position, removed trial components, and irrigated copiously. The final stem and head was then placed, the leg brought back up, the system reduced and fluoroscopy used to verify positioning.  We irrigated, obtained hemostasis and closed the capsule using #2 ethibond suture.  One gram of vancomycin powder was placed in the surgical bed. The fascia was closed with #1 vicryl plus, the deep fat layer was closed with 0 vicryl, the subcutaneous layers closed with 2.0 Vicryl Plus and the skin closed with 2.0 nylon and dermabond. A sterile dressing was applied. The patient was awakened in the operating room and taken to recovery in stable condition.  All sponge, needle, and instrument counts were correct at the end of the case.   Position: supine  Complications: see description of procedure.  Time Out: performed   Drains/Packing: none  Estimated blood loss: see anesthesia record  Returned to Recovery Room: in good condition.   Antibiotics: yes   Mechanical VTE (DVT) Prophylaxis: sequential compression devices, TED thigh-high  Chemical VTE (DVT) Prophylaxis: aspirin   Fluid Replacement: see anesthesia record  Specimens Removed: 1 to pathology   Sponge and Instrument Count Correct? yes   PACU:  portable radiograph - low AP   Plan/RTC: Return in 2 weeks for staple removal. Weight Bearing/Load Lower Extremity: full  Hip precautions:  none Suture Removal: 2 weeks   N. Glee Arvin, MD Aldean Baker 1:23 PM   Implant Name Type Inv. Item Serial No. Manufacturer Lot No. LRB No. Used Action  ACETABULAR CUP Cathe Mons - [id number] Plate ACETABULAR CUP W GRIPTION  DEPUY SYNTHES [id number] Right 1 Implanted  SCREW 6.5MMX30MM - [id number] Screw SCREW 6.5MMX30MM  DEPUY SYNTHES [id number] Right 1 Implanted  LINER NEUTRAL 54X36MM PLUS 4 - [id number] Hips LINER NEUTRAL 54X36MM PLUS 4  DEPUY SYNTHES [id number] Right 1 Implanted  STEM FEM ACTIS STD SZ4 - [id number] Stem STEM FEM ACTIS STD SZ4  DEPUY SYNTHES [id number] Right 1 Implanted  HEAD CERAMIC DELTA 36 PLUS 1.5 - [id number] Hips HEAD CERAMIC DELTA 36 PLUS 1.5  DEPUY SYNTHES [id number] Right 1 Implanted

## 2019-08-31 NOTE — H&P (Signed)
PREOPERATIVE H&P  Chief Complaint: right hip avascular necrosis  HPI: [name] [name] is a 39 y.o. female who presents for surgical treatment of right hip avascular necrosis.  She denies any changes in medical history.  Past Medical History:  Diagnosis Date  . ADHD (attention deficit hyperactivity disorder)   . Arthritis   . Ectopic pregnancy   . Hypertension    Past Surgical History:  Procedure Laterality Date  . WISDOM TOOTH EXTRACTION     Social History   Socioeconomic History  . Marital status: Divorced    Spouse name: Jordan Caraveo  . Number of children: 0  . Years of education: 40  . Highest education level: Not on file  Occupational History  . Occupation: Office manager    Comment: [employer]  . Occupation: blogger    Comment: beer blog for [employer]  Tobacco Use  . Smoking status: Former Smoker    Packs/day: 1.00    Years: 23.00    Pack years: 23.00    Quit date: 05/28/2017    Years since quitting: 2.2  . Smokeless tobacco: Never Used  Substance and Sexual Activity  . Alcohol use: Yes    Alcohol/week: 0.0 standard drinks    Comment: 4 drinks per day  . Drug use: No  . Sexual activity: Yes    Partners: Male    Birth control/protection: None    Comment: trying to become pregnant  Other Topics Concern  . Not on file  Social History Narrative   Lives with her husband.   Mother and grandfather live nearby.   Social Determinants of Health   Financial Resource Strain:   . Difficulty of Paying Living Expenses:   Food Insecurity:   . Worried About Programme researcher, broadcasting/film/video in the Last Year:   . Barista in the Last Year:   Transportation Needs:   . Freight forwarder (Medical):   Marland Kitchen Lack of Transportation (Non-Medical):   Physical Activity:   . Days of Exercise per Week:   . Minutes of Exercise per Session:   Stress:   . Feeling of Stress :   Social Connections:   . Frequency of Communication with Friends and Family:   . Frequency of  Social Gatherings with Friends and Family:   . Attends Religious Services:   . Active Member of Clubs or Organizations:   . Attends Banker Meetings:   Marland Kitchen Marital Status:    Family History  Problem Relation Age of Onset  . Hypertension Father   . Heart disease Father   . Healthy Mother    Allergies  Allergen Reactions  . Toradol [Ketorolac Tromethamine] Nausea And Vomiting   Prior to Admission medications   Medication Sig Start Date End Date Taking? Authorizing Provider  amphetamine-dextroamphetamine (ADDERALL XR) 25 MG 24 hr capsule Take 25 mg by mouth every morning.   Yes [provider]  amphetamine-dextroamphetamine (ADDERALL XR) 30 MG 24 hr capsule Take 30 mg by mouth daily in the afternoon.    Yes [provider]  chlorthalidone (HYGROTON) 25 MG tablet Take 1 tablet (25 mg total) by mouth daily. 08/05/19  Yes Janeece Agee, NP  ciprofloxacin (CIPRO) 500 MG tablet cipro 500 mg bid x 5 days. 08/28/19  Yes Tarry Kos, MD  cyclobenzaprine (FLEXERIL) 10 MG tablet TAKE 1 TABLET(10 MG) BY MOUTH THREE TIMES DAILY AS NEEDED FOR MUSCLE SPASMS Patient taking differently: Take 10 mg by mouth 3 (three) times daily  as needed for muscle spasms.  08/24/19  Yes Maximiano Coss, NP  methocarbamol (ROBAXIN) 500 MG tablet Take 1 tablet (500 mg total) by mouth 2 (two) times daily as needed. 08/26/19  Yes Aundra Dubin, PA-C  oxyCODONE-acetaminophen (PERCOCET) 10-325 MG tablet Take 1 tablet by mouth every 4 (four) hours as needed for pain.   Yes [provider]  traMADol (ULTRAM) 50 MG tablet TAKE 1 TABLET(50 MG) BY MOUTH EVERY 8 HOURS FOR UP TO 5 DAYS AS NEEDED 08/28/19  Yes Maximiano Coss, NP  meloxicam (MOBIC) 15 MG tablet Take 1 tablet (15 mg total) by mouth daily. 08/05/19   Maximiano Coss, NP  ondansetron (ZOFRAN) 4 MG tablet Take 1 tablet (4 mg total) by mouth every 8 (eight) hours as needed for nausea or vomiting. 08/26/19   Aundra Dubin, PA-C    oxyCODONE (OXY IR/ROXICODONE) 5 MG immediate release tablet Take 1-2 tablets (5-10 mg total) by mouth every 8 (eight) hours as needed for severe pain. 08/31/19   Leandrew Koyanagi, MD  oxyCODONE (OXYCONTIN) 10 mg 12 hr tablet Take 1 tablet (10 mg total) by mouth every 12 (twelve) hours. Patient not taking: Reported on 08/27/2019 08/26/19   Aundra Dubin, PA-C  oxyCODONE (OXYCONTIN) 10 mg 12 hr tablet Take 1 tablet (10 mg total) by mouth every 12 (twelve) hours for 3 days. 08/31/19 09/03/19  Leandrew Koyanagi, MD  oxyCODONE-acetaminophen (PERCOCET) 5-325 MG tablet Take 1-2 tablets by mouth every 6 (six) hours as needed for severe pain. 08/26/19   Aundra Dubin, PA-C  polyethylene glycol (MIRALAX) 17 g packet Take 17 g by mouth daily as needed. 08/26/19   Aundra Dubin, PA-C     Positive ROS: All other systems have been reviewed and were otherwise negative with the exception of those mentioned in the HPI and as above.  Physical Exam: General: Alert, no acute distress Cardiovascular: No pedal edema Respiratory: No cyanosis, no use of accessory musculature GI: abdomen soft Skin: No lesions in the area of chief complaint Neurologic: Sensation intact distally Psychiatric: Patient is competent for consent with normal mood and affect Lymphatic: no lymphedema  MUSCULOSKELETAL: exam stable  Assessment: right hip avascular necrosis  Plan: Plan for Procedure(s): RIGHT TOTAL HIP ARTHROPLASTY ANTERIOR APPROACH  The risks benefits and alternatives were discussed with the patient including but not limited to the risks of nonoperative treatment, versus surgical intervention including infection, bleeding, nerve injury,  blood clots, cardiopulmonary complications, morbidity, mortality, among others, and they were willing to proceed.   Preoperative templating of the joint replacement has been completed, documented, and submitted to the Operating Room personnel in order to optimize intra-operative equipment  management.  Eduard Roux, MD   08/31/2019 1:23 PM

## 2019-08-31 NOTE — Telephone Encounter (Signed)
Can you check on this?  All of the medications I am calling in for Monday joint patients the week before are to be started after surgery, not before..unless it is an antibiotic for a uti

## 2019-08-31 NOTE — Plan of Care (Signed)
Patient alert and oriented, mae's well, voiding adequate amount of urine, swallowing without difficulty, no c/o pain at time of discharge. Patient discharged home with family. Script and discharged instructions given to patient. Patient and family stated understanding of instructions given. Patient has an appointment with Dr. Xu 

## 2019-08-31 NOTE — Discharge Instructions (Signed)

## 2019-08-31 NOTE — Anesthesia Procedure Notes (Signed)
Spinal  Patient location during procedure: OR Start time: 08/31/2019 11:50 AM End time: 08/31/2019 11:53 AM Staffing Anesthesiologist: Bethena Midget, MD Preanesthetic Checklist Completed: patient identified, IV checked, site marked, risks and benefits discussed, surgical consent, monitors and equipment checked, pre-op evaluation and timeout performed Spinal Block Patient position: sitting Prep: DuraPrep Patient monitoring: heart rate, cardiac monitor, continuous pulse ox and blood pressure Approach: midline Location: L3-4 Injection technique: single-shot Needle Needle type: Sprotte  Needle gauge: 24 G Needle length: 9 cm Assessment Sensory level: T4

## 2019-08-31 NOTE — Transfer of Care (Signed)
Immediate Anesthesia Transfer of Care Note  Patient: [name] [name]  Procedure(s) Performed: RIGHT TOTAL HIP ARTHROPLASTY ANTERIOR APPROACH (Right Hip)  Patient Location: PACU  Anesthesia Type:Spinal  Level of Consciousness: awake, alert  and oriented  Airway & Oxygen Therapy: Patient Spontanous Breathing  Post-op Assessment: Report given to RN and Post -op Vital signs reviewed and stable  Post vital signs: Reviewed and stable  Last Vitals:  Vitals Value Taken Time  BP    Temp    Pulse    Resp 14 08/31/19 1409  SpO2    Vitals shown include unvalidated device data.  Last Pain:  Vitals:   08/31/19 1034  TempSrc:   PainSc: 7          Complications: No apparent anesthesia complications

## 2019-08-31 NOTE — Telephone Encounter (Signed)
PA done through [email or web address] PENDING. I will get a fax with approval or denial.    Jay Mccuistion Key: [id number] Need help? Call us at [phone] Status Additional Information Required Drug OxyCONTIN 10MG  er tablets Form [employer] [place] [employer] Electronic Request Form (CB)

## 2019-09-01 ENCOUNTER — Encounter: Payer: Self-pay | Admitting: *Deleted

## 2019-09-01 ENCOUNTER — Other Ambulatory Visit: Payer: Self-pay | Admitting: Registered Nurse

## 2019-09-01 DIAGNOSIS — M87051 Idiopathic aseptic necrosis of right femur: Secondary | ICD-10-CM

## 2019-09-01 MED ORDER — OXYCODONE-ACETAMINOPHEN 5-325 MG PO TABS: 1.0000 | ORAL_TABLET | Freq: Four times a day (QID) | ORAL | 0 refills | Status: DC | PRN

## 2019-09-01 NOTE — Anesthesia Postprocedure Evaluation (Signed)
Anesthesia Post Note  Patient: [name] [name]  Procedure(s) Performed: RIGHT TOTAL HIP ARTHROPLASTY ANTERIOR APPROACH (Right Hip)     Patient location during evaluation: PACU Anesthesia Type: MAC Level of consciousness: oriented and awake and alert Pain management: pain level controlled Vital Signs Assessment: post-procedure vital signs reviewed and stable Respiratory status: spontaneous breathing, respiratory function stable and patient connected to nasal cannula oxygen Cardiovascular status: blood pressure returned to baseline and stable Postop Assessment: no headache, no backache and no apparent nausea or vomiting Anesthetic complications: no    Last Vitals:  Vitals:   08/31/19 1527 08/31/19 1550  BP: (!) 150/98 (!) 163/105  Pulse:  79  Resp: (!) 9 16  Temp: (!) 36.4 C 36.8 C  SpO2:  100%    Last Pain:  Vitals:   08/31/19 1550  TempSrc: Oral  PainSc:    Pain Goal: Patients Stated Pain Goal: 3 (08/31/19 1510)                 Bona Hubbard

## 2019-09-01 NOTE — Discharge Summary (Signed)
Patient ID: [name] [name] MRN: [id number] DOB/AGE: Sep 22, 1980 39 y.o.  Admit date: 08/31/2019 Discharge date: 09/01/2019  Admission Diagnoses:  <principal problem not specified>  Discharge Diagnoses:  Active Problems:   Status post total replacement of right hip   Past Medical History:  Diagnosis Date  . ADHD (attention deficit hyperactivity disorder)   . Arthritis   . Ectopic pregnancy   . Hypertension     Surgeries: Procedure(s): RIGHT TOTAL HIP ARTHROPLASTY ANTERIOR APPROACH on 08/31/2019   Consultants (if any):   Discharged Condition: Improved  Hospital Course: [name] [name] is an 39 y.o. female who was admitted 08/31/2019 with a diagnosis of <principal problem not specified> and went to the operating room on 08/31/2019 and underwent the above named procedures.    She was given perioperative antibiotics:  Anti-infectives (From admission, onward)   Start     Dose/Rate Route Frequency Ordered Stop   08/31/19 2000  ciprofloxacin (CIPRO) tablet 500 mg  Status:  Discontinued    Note to Pharmacy: cipro 500 mg bid x 5 days.     500 mg Oral 2 times daily 08/31/19 1547 09/01/19 0054   08/31/19 1600  ceFAZolin (ANCEF) IVPB 2g/100 mL premix  Status:  Discontinued     2 g 200 mL/hr over 30 Minutes Intravenous Every 6 hours 08/31/19 1547 09/01/19 0054   08/31/19 1223  vancomycin (VANCOCIN) powder  Status:  Discontinued       As needed 08/31/19 1223 08/31/19 1402   08/31/19 1015  ceFAZolin (ANCEF) IVPB 2g/100 mL premix     2 g 200 mL/hr over 30 Minutes Intravenous On call to O.R. 08/31/19 1004 08/31/19 1200   08/31/19 1009  ceFAZolin (ANCEF) 2-4 GM/100ML-% IVPB    Note to Pharmacy: Pauletta Browns   : cabinet override      08/31/19 1009 08/31/19 1212    .  She was given sequential compression devices, early ambulation, and appropriate chemoprophylaxis for DVT prophylaxis.  She benefited maximally from the hospital stay and there were no complications.     Recent vital signs:  Vitals:   08/31/19 1527 08/31/19 1550  BP: (!) 150/98 (!) 163/105  Pulse:  79  Resp: (!) 9 16  Temp: (!) 97.5 F (36.4 C) 98.3 F (36.8 C)  SpO2:  100%    Recent laboratory studies:  Lab Results  Component Value Date   HGB 13.0 08/27/2019   HGB 14.5 05/15/2016   HGB 14.4 11/19/2014   Lab Results  Component Value Date   WBC 10.2 08/27/2019   PLT 446 (H) 08/27/2019   Lab Results  Component Value Date   INR 0.8 08/27/2019   Lab Results  Component Value Date   NA 137 08/27/2019   K 3.1 (L) 08/27/2019   CL 96 (L) 08/27/2019   CO2 30 08/27/2019   BUN 20 08/27/2019   CREATININE 0.70 08/27/2019   GLUCOSE 103 (H) 08/27/2019    Discharge Medications:   Allergies as of 08/31/2019      Reactions   Toradol [ketorolac Tromethamine] Nausea And Vomiting      Medication List    TAKE these medications   amphetamine-dextroamphetamine 30 MG 24 hr capsule Commonly known as: ADDERALL XR Take 30 mg by mouth daily in the afternoon.   amphetamine-dextroamphetamine 25 MG 24 hr capsule Commonly known as: ADDERALL XR Take 25 mg by mouth every morning.   chlorthalidone 25 MG tablet Commonly known as: HYGROTON Take 1 tablet (25 mg total) by mouth  daily.   ciprofloxacin 500 MG tablet Commonly known as: CIPRO cipro 500 mg bid x 5 days.   cyclobenzaprine 10 MG tablet Commonly known as: FLEXERIL TAKE 1 TABLET(10 MG) BY MOUTH THREE TIMES DAILY AS NEEDED FOR MUSCLE SPASMS What changed: See the new instructions.   meloxicam 15 MG tablet Commonly known as: MOBIC Take 1 tablet (15 mg total) by mouth daily.   methocarbamol 500 MG tablet Commonly known as: Robaxin Take 1 tablet (500 mg total) by mouth 2 (two) times daily as needed.   ondansetron 4 MG tablet Commonly known as: Zofran Take 1 tablet (4 mg total) by mouth every 8 (eight) hours as needed for nausea or vomiting.   oxyCODONE 10 mg 12 hr tablet Commonly known as: OXYCONTIN Take 1 tablet (10  mg total) by mouth every 12 (twelve) hours. What changed: Another medication with the same name was added. Make sure you understand how and when to take each.   oxyCODONE 5 MG immediate release tablet Commonly known as: Oxy IR/ROXICODONE Take 1-2 tablets (5-10 mg total) by mouth every 8 (eight) hours as needed for severe pain. What changed: You were already taking a medication with the same name, and this prescription was added. Make sure you understand how and when to take each.   oxyCODONE 10 mg 12 hr tablet Commonly known as: OXYCONTIN Take 1 tablet (10 mg total) by mouth every 12 (twelve) hours for 3 days. What changed: You were already taking a medication with the same name, and this prescription was added. Make sure you understand how and when to take each.   polyethylene glycol 17 g packet Commonly known as: MiraLax Take 17 g by mouth daily as needed.   traMADol 50 MG tablet Commonly known as: ULTRAM TAKE 1 TABLET(50 MG) BY MOUTH EVERY 8 HOURS FOR UP TO 5 DAYS AS NEEDED       Diagnostic Studies: DG Chest 2 View  Result Date: 08/27/2019 CLINICAL DATA:  Preoperative assessment for hip replacement. Hypertension. EXAM: CHEST - 2 VIEW COMPARISON:  January 18, 2005 FINDINGS: Lungs are clear. Heart size and pulmonary vascularity are normal. No adenopathy. No bone lesions. IMPRESSION: No abnormality noted. Electronically Signed   By: Lowella Grip III M.D.   On: 08/27/2019 16:16   DG Lumbar Spine Complete  Result Date: 08/05/2019 CLINICAL DATA:  Chronic right-sided low back pain with right sciatica. EXAM: LUMBAR SPINE - COMPLETE 4+ VIEW COMPARISON:  None. FINDINGS: Lumbar spine appears normal. The disc spaces are preserved. No facet arthritis. Lateral alignment is normal. 7 mm elongated calcification to the right of the lumbar spine at L2-3 is demonstrated to be too far anterior on the lateral view to be within the ureter. Note is made of collapse of the right femoral head suggesting  avascular necrosis. This is incompletely visualized. IMPRESSION: 1. Normal lumbar spine. 2. 7 mm calcification to the right of the lumbar spine at L2-3 not felt to be within the ureter. 3. Avascular necrosis of the right femoral head. Electronically Signed   By: Lorriane Shire M.D.   On: 08/05/2019 12:24   DG Pelvis Portable  Result Date: 08/31/2019 CLINICAL DATA:  Right hip replacement EXAM: PORTABLE PELVIS 1-2 VIEWS COMPARISON:  08/31/2019, 08/15/2019 FINDINGS: Pubic symphysis and rami are intact. Interval right hip replacement with normal alignment and no fracture. IMPRESSION: Status post right hip replacement.  No acute osseous abnormality. Electronically Signed   By: Donavan Foil M.D.   On: 08/31/2019 15:33  DG C-Arm 1-60 Min  Result Date: 08/31/2019 CLINICAL DATA:  Right total hip arthroplasty EXAM: OPERATIVE RIGHT HIP (WITH PELVIS IF PERFORMED) 4 VIEWS TECHNIQUE: Fluoroscopic spot image(s) were submitted for interpretation post-operatively. COMPARISON:  None. FINDINGS: Multiple intraoperative fluoroscopic spot images are provided. Interval right total hip arthroplasty. Normal alignment. FLUOROSCOPY TIME:  15 seconds IMPRESSION: Intraoperative localization. Electronically Signed   By: Elige Ko   On: 08/31/2019 13:44   DG Hip Unilat W OR W/O Pelvis 1V Right  Result Date: 08/05/2019 CLINICAL DATA:  Right hip pain with sciatica. EXAM: DG HIP (WITH OR WITHOUT PELVIS) 1V RIGHT COMPARISON:  None. FINDINGS: There is collapse of the right femoral head within with a 3 cm area of lucency in the deformed femoral head. Marked superior joint space loss with lateral subluxation of the femoral head. There are dystrophic calcifications at the lateral aspect of the hip joint. The left hip appears normal. Pelvic bones are normal. IMPRESSION: Avascular necrosis of the right femoral head with secondary arthritic changes with lateral subluxation of the right femoral head. Dystrophic calcifications at the lateral  aspect of the right hip joint. Electronically Signed   By: Francene Boyers M.D.   On: 08/05/2019 12:27   DG HIP OPERATIVE UNILAT W OR W/O PELVIS RIGHT  Result Date: 08/31/2019 CLINICAL DATA:  Right total hip arthroplasty EXAM: OPERATIVE RIGHT HIP (WITH PELVIS IF PERFORMED) 4 VIEWS TECHNIQUE: Fluoroscopic spot image(s) were submitted for interpretation post-operatively. COMPARISON:  None. FINDINGS: Multiple intraoperative fluoroscopic spot images are provided. Interval right total hip arthroplasty. Normal alignment. FLUOROSCOPY TIME:  15 seconds IMPRESSION: Intraoperative localization. Electronically Signed   By: Elige Ko   On: 08/31/2019 13:44    Disposition: Discharge disposition: 01-Home or Self Care       Discharge Instructions    Call MD / Call 911   Complete by: As directed    If you experience chest pain or shortness of breath, CALL 911 and be transported to the hospital emergency room.  If you develope a fever above 101.5 F, pus (white drainage) or increased drainage or redness at the wound, or calf pain, call your surgeon's office.   Constipation Prevention   Complete by: As directed    Drink plenty of fluids.  Prune juice may be helpful.  You may use a stool softener, such as Colace (over the counter) 100 mg twice a day.  Use MiraLax (over the counter) for constipation as needed.   Driving restrictions   Complete by: As directed    No driving while taking narcotic pain meds.   Increase activity slowly as tolerated   Complete by: As directed       Follow-up Information    Tarry Kos, MD In 2 weeks.   Specialty: Orthopedic Surgery Why: For suture removal, For wound re-check Contact information: [address] [place] [address] [phone]            Signed: Glee Arvin 09/01/2019, 5:09 PM

## 2019-09-01 NOTE — Telephone Encounter (Signed)
Pt is at home following surgery, she has PT coming to the house to work with her. Dr. Roda Shutters told her that you have to take over her pain medication prescription and has only provided a three days supply of Oxycodone 10 mg ER, requested Oxycodone 7.5 or 10 mg to the [facility]. Wants to knoew if she should schedule a follow up with you for her BP. Please advise

## 2019-09-03 ENCOUNTER — Other Ambulatory Visit (INDEPENDENT_AMBULATORY_CARE_PROVIDER_SITE_OTHER): Payer: Self-pay | Admitting: Orthopaedic Surgery

## 2019-09-03 ENCOUNTER — Telehealth: Payer: Self-pay

## 2019-09-03 ENCOUNTER — Other Ambulatory Visit: Payer: Self-pay | Admitting: Physician Assistant

## 2019-09-03 NOTE — Telephone Encounter (Signed)
See message.

## 2019-09-03 NOTE — Telephone Encounter (Signed)
PA for Oxycontin done which was denied by her ins.   Lanina Tassin (Key: [id number])  This request has received a Unfavorable outcome.  Please see letter faxed to your office for details on this adverse benefit determination.  Please note any additional information provided by Valinda Hoar [place] at the bottom of this request.    Called Patient. No answer LMOM with details.

## 2019-09-03 NOTE — Telephone Encounter (Signed)
The oxycontin is only a 3 days supply  not to be refilled.

## 2019-09-04 ENCOUNTER — Other Ambulatory Visit: Payer: Self-pay | Admitting: Orthopaedic Surgery

## 2019-09-04 ENCOUNTER — Other Ambulatory Visit: Payer: Self-pay

## 2019-09-04 ENCOUNTER — Other Ambulatory Visit: Payer: Self-pay | Admitting: Registered Nurse

## 2019-09-04 DIAGNOSIS — G8929 Other chronic pain: Secondary | ICD-10-CM

## 2019-09-04 DIAGNOSIS — M87051 Idiopathic aseptic necrosis of right femur: Secondary | ICD-10-CM

## 2019-09-04 NOTE — Telephone Encounter (Signed)
Patient is requesting a refill of the following medications: Requested Prescriptions   Pending Prescriptions Disp Refills  . meloxicam (MOBIC) 15 MG tablet [Pharmacy Med Name: MELOXICAM 15MG  TABLETS] 30 tablet 0    Sig: TAKE 1 TABLET(15 MG) BY MOUTH DAILY    Date of patient request: 09/04/2019 Last office visit: 08/05/2019 Date of last refill: 08/05/2019 Last refill amount: 30 tablets Follow up time period per chart: no follow up scheduled

## 2019-09-07 ENCOUNTER — Other Ambulatory Visit: Payer: Self-pay | Admitting: Orthopaedic Surgery

## 2019-09-07 ENCOUNTER — Other Ambulatory Visit (INDEPENDENT_AMBULATORY_CARE_PROVIDER_SITE_OTHER): Payer: Self-pay | Admitting: Orthopaedic Surgery

## 2019-09-07 ENCOUNTER — Other Ambulatory Visit: Payer: Self-pay | Admitting: Registered Nurse

## 2019-09-07 ENCOUNTER — Other Ambulatory Visit: Payer: Self-pay | Admitting: Physician Assistant

## 2019-09-07 DIAGNOSIS — M87051 Idiopathic aseptic necrosis of right femur: Secondary | ICD-10-CM

## 2019-09-07 MED ORDER — TRAMADOL HCL 50 MG PO TABS: 50.0000 mg | ORAL_TABLET | Freq: Three times a day (TID) | ORAL | 0 refills | Status: DC | PRN

## 2019-09-07 MED ORDER — OXYCODONE-ACETAMINOPHEN 5-325 MG PO TABS: 1.0000 | ORAL_TABLET | Freq: Four times a day (QID) | ORAL | 0 refills | Status: DC | PRN

## 2019-09-07 NOTE — Telephone Encounter (Signed)
Patient is requesting a refill of the following medications: Requested Prescriptions   Pending Prescriptions Disp Refills  . oxyCODONE-acetaminophen (PERCOCET) 5-325 MG tablet 45 tablet 0    Sig: Take 1-2 tablets by mouth every 6 (six) hours as needed for up to 7 days for severe pain.    Date of patient request: 09/07/19 Last office visit: 08/05/19 Date of last refill: 09/01/19 Last refill amount: 45 tablets Follow up time period per chart: No follow up scheduled

## 2019-09-07 NOTE — Telephone Encounter (Signed)
Xu pt 

## 2019-09-07 NOTE — Telephone Encounter (Signed)
No need to refill cipro

## 2019-09-07 NOTE — Telephone Encounter (Signed)
Patient is requesting a refill of the following medications: Requested Prescriptions   Pending Prescriptions Disp Refills  . traMADol (ULTRAM) 50 MG tablet 15 tablet 0    Date of patient request: 09/07/19 Last office visit: 08/05/19 Date of last refill: 08/28/19 Last refill amount: 15 tablets Follow up time period per chart: No follow up scheduled

## 2019-09-08 ENCOUNTER — Telehealth: Payer: Self-pay

## 2019-09-08 NOTE — Telephone Encounter (Signed)
Patient is requesting a refill of the following medications: Oxycodone 10 mg Requested Prescriptions    No prescriptions requested or ordered in this encounter    Date of patient request: 09/08/2019 Last office visit: 08/05/2019 Date of last refill: 08/26/2019 Last refill amount: 6 tablets Follow up time period per chart: follow up with ortho was advised no other return specified

## 2019-09-09 ENCOUNTER — Telehealth: Payer: Self-pay

## 2019-09-09 ENCOUNTER — Telehealth: Payer: Self-pay | Admitting: Orthopaedic Surgery

## 2019-09-09 DIAGNOSIS — Z471 Aftercare following joint replacement surgery: Secondary | ICD-10-CM | POA: Diagnosis not present

## 2019-09-09 DIAGNOSIS — Z87891 Personal history of nicotine dependence: Secondary | ICD-10-CM | POA: Diagnosis not present

## 2019-09-09 DIAGNOSIS — F909 Attention-deficit hyperactivity disorder, unspecified type: Secondary | ICD-10-CM | POA: Diagnosis not present

## 2019-09-09 DIAGNOSIS — I10 Essential (primary) hypertension: Secondary | ICD-10-CM | POA: Diagnosis not present

## 2019-09-09 DIAGNOSIS — Z96641 Presence of right artificial hip joint: Secondary | ICD-10-CM | POA: Diagnosis not present

## 2019-09-09 NOTE — Telephone Encounter (Signed)
Please advise on Aspirin question.

## 2019-09-09 NOTE — Telephone Encounter (Signed)
Flor from [facility] called. She would like verbal orders for PT 2x 1wk, 3x 1wk. Her call back number [phone]. Patient would like to know if she should be taking the Asprin 2x a day.

## 2019-09-09 NOTE — Telephone Encounter (Signed)
Carmela Rima  from [facility] called regarding patient. States his staff has scheduled 4 appts to go out for HHPT and prior to appts she calls to cancel HHPT visit. States for therapist not to come out to her house. Dorene Sorrow said they will go out today for the last time and hopefully she does not Cx appt.   S/P Right THA- 08/31/2019.

## 2019-09-09 NOTE — Telephone Encounter (Signed)
YES

## 2019-09-09 NOTE — Telephone Encounter (Signed)
Ok thanks 

## 2019-09-10 ENCOUNTER — Encounter: Payer: Self-pay | Admitting: Registered Nurse

## 2019-09-10 ENCOUNTER — Other Ambulatory Visit: Payer: Self-pay | Admitting: Physician Assistant

## 2019-09-10 DIAGNOSIS — M87051 Idiopathic aseptic necrosis of right femur: Secondary | ICD-10-CM

## 2019-09-10 NOTE — Telephone Encounter (Signed)
Called Flor to advise.

## 2019-09-11 MED ORDER — OXYCODONE-ACETAMINOPHEN 5-325 MG PO TABS: 1.0000 | ORAL_TABLET | Freq: Four times a day (QID) | ORAL | 0 refills | Status: AC | PRN

## 2019-09-11 MED ORDER — TRAMADOL HCL 50 MG PO TABS: 50.0000 mg | ORAL_TABLET | Freq: Three times a day (TID) | ORAL | 0 refills | Status: DC | PRN

## 2019-09-11 NOTE — Telephone Encounter (Signed)
Pt would like a second round of abx as she is still having urgency to urinate and is having a harder time getting to the restroom due to hip replacement. Is this acceptable?  Patient is requesting a refill of the following medications: Tramadol 50 mg Requested Prescriptions    No prescriptions requested or ordered in this encounter    Date of patient request: 09/11/2019  Last office visit: 08/05/2019 Date of last refill: 09/07/2019 Last refill amount: 15 tablets (3 days) Follow up time period per chart: unspecified  Patient is requesting a refill of the following medications: Oxy/Apap 5-325mg  Requested Prescriptions    No prescriptions requested or ordered in this encounter    Date of patient request: 09/11/2019 Last office visit: 08/05/2019 Date of last refill: 09/07/2019 Last refill amount: 45 (7 days) Follow up time period per chart: unspecified

## 2019-09-14 ENCOUNTER — Other Ambulatory Visit: Payer: Self-pay | Admitting: Registered Nurse

## 2019-09-14 DIAGNOSIS — G8929 Other chronic pain: Secondary | ICD-10-CM

## 2019-09-14 NOTE — Telephone Encounter (Signed)
Patient is requesting a refill of the following medications: Requested Prescriptions   Pending Prescriptions Disp Refills  . cyclobenzaprine (FLEXERIL) 10 MG tablet [Pharmacy Med Name: CYCLOBENZAPRINE 10MG  TABLETS] 30 tablet 1    Sig: TAKE 1 TABLET(10 MG) BY MOUTH THREE TIMES DAILY AS NEEDED FOR MUSCLE SPASMS    Date of patient request: 09/14/2019 Last office visit: 08/05/2019 Date of last refill: 08/24/2019 Last refill amount: 30 tablets  Follow up time period per chart: No follow up scheduled

## 2019-09-15 ENCOUNTER — Other Ambulatory Visit: Payer: Self-pay

## 2019-09-15 ENCOUNTER — Encounter: Payer: Self-pay | Admitting: Physician Assistant

## 2019-09-15 ENCOUNTER — Ambulatory Visit (INDEPENDENT_AMBULATORY_CARE_PROVIDER_SITE_OTHER): Payer: BLUE CROSS/BLUE SHIELD | Admitting: Physician Assistant

## 2019-09-15 DIAGNOSIS — F909 Attention-deficit hyperactivity disorder, unspecified type: Secondary | ICD-10-CM | POA: Diagnosis not present

## 2019-09-15 DIAGNOSIS — I10 Essential (primary) hypertension: Secondary | ICD-10-CM | POA: Diagnosis not present

## 2019-09-15 DIAGNOSIS — Z471 Aftercare following joint replacement surgery: Secondary | ICD-10-CM | POA: Diagnosis not present

## 2019-09-15 DIAGNOSIS — Z87891 Personal history of nicotine dependence: Secondary | ICD-10-CM | POA: Diagnosis not present

## 2019-09-15 DIAGNOSIS — Z96641 Presence of right artificial hip joint: Secondary | ICD-10-CM

## 2019-09-15 MED ORDER — MUPIROCIN 2 % EX OINT: TOPICAL_OINTMENT | CUTANEOUS | 0 refills | Status: AC

## 2019-09-15 MED ORDER — CEPHALEXIN 500 MG PO CAPS: 500.0000 mg | ORAL_CAPSULE | Freq: Four times a day (QID) | ORAL | 0 refills | Status: DC

## 2019-09-15 NOTE — Progress Notes (Signed)
Post-Op Visit Note   Patient: [name] [name]           Date of Birth: May 20, 1981           MRN: [id number] Visit Date: 09/15/2019 PCP: Patient, No Pcp Per   Assessment & Plan:  Chief Complaint:  Chief Complaint  Patient presents with  . Right Hip - Routine Post Op   Visit Diagnoses:  1. Status post total replacement of right hip     Plan: Patient is a pleasant 39 year old female who comes in today 2 weeks out right total hip replacement from AVN.  She has been doing well.  No fevers or chills.  She has been working with home health physical therapy and is ambulating with a cane.  She does note that she has been wearing her TED hose around-the-clock and noticed superficial abrasions below the right knee from this.  Examination of her right hip reveals a well-healing surgical incision with nylon sutures in place.  No evidence of infection.  She does have a superficial abrasion below the right knee medial aspect from her TED hose.  No signs of infection.  Calf is soft nontender.  She is neurovascular intact distally.  At this point, sutures were removed and Steri-Strips applied.  We have applied mupirocin to the knee abrasion.  I will call in a prescription for Keflex and mupirocin to use for this.  Dental prophylaxis reinforced.  Follow-up with [place] in 4 weeks time for repeat evaluation AP pelvis lateral right hip x-rays.  Call with concerns or questions in the meantime.  Follow-Up Instructions: Return in about 4 weeks (around 10/13/2019).   Orders:  No orders of the defined types were placed in this encounter.  No orders of the defined types were placed in this encounter.   Imaging: No new imaging  PMFS History: Patient Active Problem List   Diagnosis Date Noted  . Status post total replacement of right hip 08/31/2019  . Avascular necrosis of bone of right hip (HCC) 08/05/2019  . Elevated blood pressure reading in office without diagnosis of hypertension 08/05/2019  .  Attention deficit hyperactivity disorder (ADHD) 06/22/2015   Past Medical History:  Diagnosis Date  . ADHD (attention deficit hyperactivity disorder)   . Arthritis   . Ectopic pregnancy   . Hypertension     Family History  Problem Relation Age of Onset  . Hypertension Father   . Heart disease Father   . Healthy Mother     Past Surgical History:  Procedure Laterality Date  . TOTAL HIP ARTHROPLASTY Right 08/31/2019   Procedure: RIGHT TOTAL HIP ARTHROPLASTY ANTERIOR APPROACH;  Surgeon: Tarry Kos, MD;  Location: [facility] OR;  Service: Orthopedics;  Laterality: Right;  . WISDOM TOOTH EXTRACTION     Social History   Occupational History  . Occupation: Office manager    Comment: [employer]  . Occupation: blogger    Comment: beer blog for [employer]  Tobacco Use  . Smoking status: Former Smoker    Packs/day: 1.00    Years: 23.00    Pack years: 23.00    Quit date: 05/28/2017    Years since quitting: 2.3  . Smokeless tobacco: Never Used  Substance and Sexual Activity  . Alcohol use: Yes    Alcohol/week: 0.0 standard drinks    Comment: 4 drinks per day  . Drug use: No  . Sexual activity: Yes    Partners: Male    Birth control/protection: None  Comment: trying to become pregnant

## 2019-09-18 ENCOUNTER — Other Ambulatory Visit: Payer: Self-pay | Admitting: Registered Nurse

## 2019-09-18 ENCOUNTER — Other Ambulatory Visit: Payer: Self-pay | Admitting: Orthopaedic Surgery

## 2019-09-18 ENCOUNTER — Encounter: Payer: Self-pay | Admitting: Orthopaedic Surgery

## 2019-09-21 ENCOUNTER — Other Ambulatory Visit: Payer: Self-pay | Admitting: Orthopaedic Surgery

## 2019-09-21 ENCOUNTER — Encounter: Payer: Self-pay | Admitting: Registered Nurse

## 2019-09-21 ENCOUNTER — Other Ambulatory Visit: Payer: Self-pay | Admitting: Registered Nurse

## 2019-09-21 ENCOUNTER — Encounter: Payer: Self-pay | Admitting: Orthopaedic Surgery

## 2019-09-21 DIAGNOSIS — M87051 Idiopathic aseptic necrosis of right femur: Secondary | ICD-10-CM

## 2019-09-21 MED ORDER — OXYCODONE HCL 5 MG PO TABS: 5.0000 mg | ORAL_TABLET | Freq: Three times a day (TID) | ORAL | 0 refills | Status: DC | PRN

## 2019-09-21 MED ORDER — TRAMADOL HCL 50 MG PO TABS: 50.0000 mg | ORAL_TABLET | Freq: Three times a day (TID) | ORAL | 0 refills | Status: DC | PRN

## 2019-09-21 NOTE — Telephone Encounter (Signed)
Ok to do.  Can you call in zofran 4mg  odt every 6 hours prn nausea #30

## 2019-09-21 NOTE — Telephone Encounter (Signed)
ok 

## 2019-09-21 NOTE — Telephone Encounter (Signed)
Patient is requesting a refill of the following medications: Requested Prescriptions    No prescriptions requested or ordered in this encounter  Tramadol  Date of patient request: 09/21/2019 Last office visit: 2/172021 Date of last refill: 09/11/2019 Last refill amount: 15 tab 5 days Follow up time period per chart: post op    Pt also requesting refill Oxy 5 every 6 hours be refilled as well but these were done by D. Roda Shutters should they refill?

## 2019-09-21 NOTE — Telephone Encounter (Signed)
Patient is requesting a refill of the following medications: Requested Prescriptions   Pending Prescriptions Disp Refills  . oxyCODONE (OXY IR/ROXICODONE) 5 MG immediate release tablet 30 tablet 0    Sig: Take 1-2 tablets (5-10 mg total) by mouth every 8 (eight) hours as needed for severe pain.    Date of patient request: 09/18/2019 Last office visit: 08/05/2019 Date of last refill: 08/31/2019 Last refill amount: 30 tablets Follow up time period per chart: NA

## 2019-09-22 ENCOUNTER — Telehealth: Payer: Self-pay

## 2019-09-22 NOTE — Telephone Encounter (Signed)
[name] [name] from [facility] called  And statespt had Right total hip replacement. [facility] at home D/C. Only saw her 09/09/2019, 09/15/2018. Cx 6 other times.

## 2019-09-23 ENCOUNTER — Encounter: Payer: Self-pay | Admitting: Registered Nurse

## 2019-09-24 ENCOUNTER — Ambulatory Visit: Payer: BLUE CROSS/BLUE SHIELD

## 2019-09-24 NOTE — Telephone Encounter (Signed)
Can you refuse this request it was filled 09/21/2019 but [facility] dont have clearance to close this without an action being taken on it

## 2019-09-25 ENCOUNTER — Ambulatory Visit: Payer: BLUE CROSS/BLUE SHIELD | Attending: Internal Medicine

## 2019-09-25 ENCOUNTER — Other Ambulatory Visit: Payer: Self-pay | Admitting: Registered Nurse

## 2019-09-25 DIAGNOSIS — Z23 Encounter for immunization: Secondary | ICD-10-CM

## 2019-09-25 DIAGNOSIS — M87051 Idiopathic aseptic necrosis of right femur: Secondary | ICD-10-CM

## 2019-09-25 NOTE — Progress Notes (Signed)
   Covid-19 Vaccination Clinic  Name:  [name] [name]    MRN: [id number] DOB: Aug 10, 1980  09/25/2019  [name] was observed post Covid-19 immunization for 15 minutes without incident. She was provided with Vaccine Information Sheet and instruction to access the V-Safe system.   [name] was instructed to call 911 with any severe reactions post vaccine: Marland Kitchen Difficulty breathing  . Swelling of face and throat  . A fast heartbeat  . A bad rash all over body  . Dizziness and weakness   Immunizations Administered    Name Date Dose VIS Date Route   Pfizer COVID-19 Vaccine 09/25/2019  3:15 PM 0.3 mL 05/29/2019 Intramuscular   Manufacturer: [employer], [employer]   Lot: [id number]   NDC: [address]-5

## 2019-09-25 NOTE — Telephone Encounter (Signed)
These were both filled 09/21/2019 for 30 tabs each refill not appropriate

## 2019-09-27 MED ORDER — OXYCODONE HCL 5 MG PO TABS: 5.0000 mg | ORAL_TABLET | Freq: Three times a day (TID) | ORAL | 0 refills | Status: DC | PRN

## 2019-10-02 ENCOUNTER — Other Ambulatory Visit: Payer: Self-pay | Admitting: Registered Nurse

## 2019-10-02 DIAGNOSIS — M87051 Idiopathic aseptic necrosis of right femur: Secondary | ICD-10-CM

## 2019-10-02 DIAGNOSIS — M5441 Lumbago with sciatica, right side: Secondary | ICD-10-CM

## 2019-10-02 DIAGNOSIS — G8929 Other chronic pain: Secondary | ICD-10-CM

## 2019-10-04 ENCOUNTER — Other Ambulatory Visit: Payer: Self-pay | Admitting: Registered Nurse

## 2019-10-04 DIAGNOSIS — G8929 Other chronic pain: Secondary | ICD-10-CM

## 2019-10-04 DIAGNOSIS — M5441 Lumbago with sciatica, right side: Secondary | ICD-10-CM

## 2019-10-04 DIAGNOSIS — R030 Elevated blood-pressure reading, without diagnosis of hypertension: Secondary | ICD-10-CM

## 2019-10-05 ENCOUNTER — Encounter: Payer: Self-pay | Admitting: Registered Nurse

## 2019-10-05 DIAGNOSIS — F902 Attention-deficit hyperactivity disorder, combined type: Secondary | ICD-10-CM | POA: Diagnosis not present

## 2019-10-05 MED ORDER — OXYCODONE HCL 5 MG PO TABS: 5.0000 mg | ORAL_TABLET | Freq: Three times a day (TID) | ORAL | 0 refills | Status: DC | PRN

## 2019-10-05 NOTE — Telephone Encounter (Signed)
Patient is requesting a refill of the following medications: Requested Prescriptions   Pending Prescriptions Disp Refills  . oxyCODONE (OXY IR/ROXICODONE) 5 MG immediate release tablet 30 tablet 0    Sig: Take 1-2 tablets (5-10 mg total) by mouth every 8 (eight) hours as needed for severe pain.    Date of patient request: 10/04/2019 Last office visit: 08/05/2019 Date of last refill: 09/21/2019 Last refill amount: 30 tablets Follow up time period per chart: 10/19/2019 Labs

## 2019-10-05 NOTE — Telephone Encounter (Signed)
Okay to refill medications?   Pt was last seen 08/05/19 for Chronic lower left back pain.    One of the Rx has been flagged.

## 2019-10-06 ENCOUNTER — Other Ambulatory Visit: Payer: Self-pay | Admitting: Physician Assistant

## 2019-10-06 ENCOUNTER — Other Ambulatory Visit: Payer: Self-pay | Admitting: Registered Nurse

## 2019-10-06 DIAGNOSIS — L299 Pruritus, unspecified: Secondary | ICD-10-CM

## 2019-10-06 MED ORDER — HYDROXYZINE HCL 10 MG PO TABS: 10.0000 mg | ORAL_TABLET | Freq: Three times a day (TID) | ORAL | 0 refills | Status: DC | PRN

## 2019-10-06 NOTE — Progress Notes (Signed)
Pt experiencing surgical site itching Will try nightly hydroxyzine Use OTC topicals around but not on incision  [name] Sportsman, NP

## 2019-10-06 NOTE — Telephone Encounter (Signed)
Patient has some questions and concerns about her medications. Please Advise.

## 2019-10-12 ENCOUNTER — Other Ambulatory Visit: Payer: Self-pay | Admitting: Registered Nurse

## 2019-10-13 ENCOUNTER — Ambulatory Visit (INDEPENDENT_AMBULATORY_CARE_PROVIDER_SITE_OTHER): Payer: BLUE CROSS/BLUE SHIELD

## 2019-10-13 ENCOUNTER — Encounter: Payer: Self-pay | Admitting: Orthopaedic Surgery

## 2019-10-13 ENCOUNTER — Ambulatory Visit (INDEPENDENT_AMBULATORY_CARE_PROVIDER_SITE_OTHER): Payer: BLUE CROSS/BLUE SHIELD | Admitting: Orthopaedic Surgery

## 2019-10-13 ENCOUNTER — Other Ambulatory Visit: Payer: Self-pay

## 2019-10-13 DIAGNOSIS — Z96641 Presence of right artificial hip joint: Secondary | ICD-10-CM

## 2019-10-13 NOTE — Progress Notes (Signed)
   Post-Op Visit Note   Patient: [name] [name]           Date of Birth: [birth year]           MRN: [id number] Visit Date: 10/13/2019 PCP: Janeece Agee, NP   Assessment & Plan:  Chief Complaint:  Chief Complaint  Patient presents with  . Right Hip - Follow-up   Visit Diagnoses:  1. Status post total replacement of right hip     Plan: [name] is 6 weeks status post right total hip replacement for right femoral head AVN.  She is doing well and eager to return back to work.  Surgical scar is mainly healed with a couple areas of a dry scab and some fibrinous tissue.  No signs of infection.  She has good hip flexion and quad strength.  X-rays are unremarkable.  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  Precautions were reviewed.  Continue with home exercises for strengthening.  Recheck in 6 weeks.  Follow-Up Instructions: Return in about 6 weeks (around 11/24/2019).   Orders:  Orders Placed This Encounter  Procedures  . XR HIP UNILAT W OR W/O PELVIS 2-3 VIEWS RIGHT   No orders of the defined types were placed in this encounter.   Imaging: XR HIP UNILAT W OR W/O PELVIS 2-3 VIEWS RIGHT  Result Date: 10/13/2019 Stable right total hip replacement without complication.   PMFS History: Patient Active Problem List   Diagnosis Date Noted  . Status post total replacement of right hip 08/31/2019  . Avascular necrosis of bone of right hip (HCC) 08/05/2019  . Elevated blood pressure reading in office without diagnosis of hypertension 08/05/2019  . Attention deficit hyperactivity disorder (ADHD) 06/22/2015   Past Medical History:  Diagnosis Date  . ADHD (attention deficit hyperactivity disorder)   . Arthritis   . Ectopic pregnancy   . Hypertension     Family History  Problem Relation Age of Onset  . Hypertension Father   . Heart disease Father   . Healthy Mother     Past Surgical History:  Procedure Laterality Date  . TOTAL HIP  ARTHROPLASTY Right 08/31/2019   Procedure: RIGHT TOTAL HIP ARTHROPLASTY ANTERIOR APPROACH;  Surgeon: Tarry Kos, MD;  Location: [facility] OR;  Service: Orthopedics;  Laterality: Right;  . WISDOM TOOTH EXTRACTION     Social History   Occupational History  . Occupation: Office manager    Comment: [employer]  . Occupation: blogger    Comment: beer blog for [employer]  Tobacco Use  . Smoking status: Former Smoker    Packs/day: 1.00    Years: 23.00    Pack years: 23.00    Quit date: 05/28/2017    Years since quitting: 2.3  . Smokeless tobacco: Never Used  Substance and Sexual Activity  . Alcohol use: Yes    Alcohol/week: 0.0 standard drinks    Comment: 4 drinks per day  . Drug use: No  . Sexual activity: Yes    Partners: Male    Birth control/protection: None    Comment: trying to become pregnant

## 2019-10-14 ENCOUNTER — Other Ambulatory Visit: Payer: Self-pay | Admitting: Registered Nurse

## 2019-10-15 NOTE — Telephone Encounter (Signed)
Pt med was given a week ago but he takes 1-2 tabs every 8 hours and if he out already he is probably taking 6 per day which is only a 5 day supply. So request is valid. Please send rx request to [facility].

## 2019-10-15 NOTE — Telephone Encounter (Signed)
Pt medication given 1 week ago

## 2019-10-16 MED ORDER — OXYCODONE HCL 5 MG PO TABS: 5.0000 mg | ORAL_TABLET | Freq: Three times a day (TID) | ORAL | 0 refills | Status: DC | PRN

## 2019-10-19 ENCOUNTER — Ambulatory Visit: Payer: BLUE CROSS/BLUE SHIELD

## 2019-10-20 ENCOUNTER — Ambulatory Visit: Payer: BLUE CROSS/BLUE SHIELD

## 2019-10-22 ENCOUNTER — Other Ambulatory Visit: Payer: Self-pay | Admitting: Registered Nurse

## 2019-10-22 DIAGNOSIS — G8929 Other chronic pain: Secondary | ICD-10-CM

## 2019-10-26 ENCOUNTER — Ambulatory Visit: Payer: BLUE CROSS/BLUE SHIELD

## 2019-11-01 ENCOUNTER — Other Ambulatory Visit: Payer: Self-pay | Admitting: Registered Nurse

## 2019-11-01 DIAGNOSIS — L299 Pruritus, unspecified: Secondary | ICD-10-CM

## 2019-11-02 ENCOUNTER — Other Ambulatory Visit: Payer: Self-pay | Admitting: Registered Nurse

## 2019-11-02 DIAGNOSIS — M5441 Lumbago with sciatica, right side: Secondary | ICD-10-CM

## 2019-11-02 MED ORDER — HYDROXYZINE HCL 10 MG PO TABS: 10.0000 mg | ORAL_TABLET | Freq: Three times a day (TID) | ORAL | 0 refills | Status: DC | PRN

## 2019-11-02 MED ORDER — OXYCODONE HCL 5 MG PO TABS: 5.0000 mg | ORAL_TABLET | Freq: Three times a day (TID) | ORAL | 0 refills | Status: DC | PRN

## 2019-11-02 NOTE — Telephone Encounter (Signed)
Patient is requesting a refill of the following medications: Requested Prescriptions   Pending Prescriptions Disp Refills  . hydrOXYzine (ATARAX/VISTARIL) 10 MG tablet 30 tablet 0    Sig: Take 1 tablet (10 mg total) by mouth 3 (three) times daily as needed.  Marland Kitchen oxyCODONE (OXY IR/ROXICODONE) 5 MG immediate release tablet 30 tablet 0    Sig: Take 1-2 tablets (5-10 mg total) by mouth every 8 (eight) hours as needed for severe pain.    Date of patient request: 11/02/2019 Last office visit: 08/05/2019 Date of last refill: 10/16/2019 Last refill amount: 30 Tablets  Follow up time period per chart: N/A

## 2019-11-09 ENCOUNTER — Other Ambulatory Visit: Payer: Self-pay | Admitting: Registered Nurse

## 2019-11-09 DIAGNOSIS — G8929 Other chronic pain: Secondary | ICD-10-CM

## 2019-11-09 NOTE — Telephone Encounter (Signed)
Patient is requesting a refill of the following medications: Requested Prescriptions   Pending Prescriptions Disp Refills  . cyclobenzaprine (FLEXERIL) 10 MG tablet [Pharmacy Med Name: CYCLOBENZAPRINE 10MG  TABLETS] 30 tablet 1    Sig: TAKE 1 TABLET(10 MG) BY MOUTH THREE TIMES DAILY AS NEEDED FOR MUSCLE SPASMS    Date of patient request: 11/09/2019 Last office visit: 08/05/2019 Date of last refill: 10/22/2019 Last refill amount:30 tablets one refill  Follow up time period per chart: N/A

## 2019-11-16 ENCOUNTER — Other Ambulatory Visit: Payer: Self-pay | Admitting: Registered Nurse

## 2019-11-16 DIAGNOSIS — L299 Pruritus, unspecified: Secondary | ICD-10-CM

## 2019-11-17 ENCOUNTER — Ambulatory Visit: Payer: BLUE CROSS/BLUE SHIELD | Attending: Internal Medicine

## 2019-11-17 DIAGNOSIS — Z23 Encounter for immunization: Secondary | ICD-10-CM

## 2019-11-17 MED ORDER — HYDROXYZINE HCL 10 MG PO TABS: 10.0000 mg | ORAL_TABLET | Freq: Three times a day (TID) | ORAL | 0 refills | Status: DC | PRN

## 2019-11-17 MED ORDER — OXYCODONE HCL 5 MG PO TABS: 5.0000 mg | ORAL_TABLET | Freq: Three times a day (TID) | ORAL | 0 refills | Status: DC | PRN

## 2019-11-17 NOTE — Telephone Encounter (Signed)
11/17/2019 - PATIENT REQUESTING REFILLS ON HYDROXYZINE 10 mg AND OXYCODONE 5 MG. I TRIED TO CALL AND SCHEDULE A FOLLOW-UP APPOINTMENT WITH RICH MORROW BUT I HAD TO LEAVE HER A VOICE MAIL TO RETURN MY CALL. SHE DOES NOT HAVE ANY REFILLS. MBC

## 2019-11-17 NOTE — Telephone Encounter (Signed)
Patient is requesting a refill of the following medications: Requested Prescriptions   Pending Prescriptions Disp Refills   hydrOXYzine (ATARAX/VISTARIL) 10 MG tablet 30 tablet 0    Sig: Take 1 tablet (10 mg total) by mouth 3 (three) times daily as needed.   oxyCODONE (OXY IR/ROXICODONE) 5 MG immediate release tablet 30 tablet 0    Sig: Take 1-2 tablets (5-10 mg total) by mouth every 8 (eight) hours as needed for severe pain.    Date of patient request: 11/17/2019 Last office visit: 08/05/2019 Date of last refill: 11/02/2019 Last refill amount: 30 tablets

## 2019-11-17 NOTE — Telephone Encounter (Signed)
Patient is requesting a refill of the following medications: Requested Prescriptions   Pending Prescriptions Disp Refills   hydrOXYzine (ATARAX/VISTARIL) 10 MG tablet 30 tablet 0    Sig: Take 1 tablet (10 mg total) by mouth 3 (three) times daily as needed.   oxyCODONE (OXY IR/ROXICODONE) 5 MG immediate release tablet 30 tablet 0    Sig: Take 1-2 tablets (5-10 mg total) by mouth every 8 (eight) hours as needed for severe pain.    Date of patient request: 11/17/2019 Last office visit: 08/05/2019 Date of last refill: 11/02/2019 Last refill amount: 30 tabs

## 2019-11-17 NOTE — Progress Notes (Signed)
   Covid-19 Vaccination Clinic  Name:  [name] [name]    MRN: [id number] DOB: 29-Mar-1981  11/17/2019  [name] [name] was observed post Covid-19 immunization for 15 minutes without incident. She was provided with Vaccine Information Sheet and instruction to access the V-Safe system.   [name] [name] was instructed to call 911 with any severe reactions post vaccine: Marland Kitchen Difficulty breathing  . Swelling of face and throat  . A fast heartbeat  . A bad rash all over body  . Dizziness and weakness   Immunizations Administered    Name Date Dose VIS Date Route   Pfizer COVID-19 Vaccine 11/17/2019  3:27 PM 0.3 mL 08/12/2018 Intramuscular   Manufacturer: [employer], [employer]   Lot: [id number]   NDC: [address]-5

## 2019-11-24 ENCOUNTER — Ambulatory Visit: Payer: BLUE CROSS/BLUE SHIELD | Admitting: Orthopaedic Surgery

## 2019-12-01 ENCOUNTER — Encounter: Payer: Self-pay | Admitting: Orthopaedic Surgery

## 2019-12-01 ENCOUNTER — Ambulatory Visit (INDEPENDENT_AMBULATORY_CARE_PROVIDER_SITE_OTHER): Payer: BLUE CROSS/BLUE SHIELD | Admitting: Orthopaedic Surgery

## 2019-12-01 ENCOUNTER — Other Ambulatory Visit: Payer: Self-pay | Admitting: Registered Nurse

## 2019-12-01 ENCOUNTER — Other Ambulatory Visit: Payer: Self-pay | Admitting: Physician Assistant

## 2019-12-01 DIAGNOSIS — G8929 Other chronic pain: Secondary | ICD-10-CM

## 2019-12-01 DIAGNOSIS — M87051 Idiopathic aseptic necrosis of right femur: Secondary | ICD-10-CM

## 2019-12-01 DIAGNOSIS — Z96641 Presence of right artificial hip joint: Secondary | ICD-10-CM

## 2019-12-01 MED ORDER — OXYCODONE HCL 5 MG PO TABS: 5.0000 mg | ORAL_TABLET | Freq: Three times a day (TID) | ORAL | 0 refills | Status: DC | PRN

## 2019-12-01 NOTE — Telephone Encounter (Signed)
Patient is requesting a refill of the following medications: Requested Prescriptions   Pending Prescriptions Disp Refills  . meloxicam (MOBIC) 15 MG tablet [Pharmacy Med Name: MELOXICAM 15MG  TABLETS] 30 tablet 0    Sig: TAKE 1 TABLET(15 MG) BY MOUTH DAILY    Date of patient request: 12/01/2019 Last office visit: 08/05/2019 Date of last refill: 11/02/2019 Last refill amount: 30 tablets  Follow up time period per chart: N/A

## 2019-12-01 NOTE — Progress Notes (Signed)
° °  Post-Op Visit Note   Patient: [name] [name]           Date of Birth: 06-Jun-1981           MRN: [id number] Visit Date: 12/01/2019 PCP: Janeece Agee, NP   Assessment & Plan:  Chief Complaint:  Chief Complaint  Patient presents with   Right Hip - Pain, Follow-up   Visit Diagnoses:  1. Status post total replacement of right hip     Plan: Patient is 3 months status post right total hip replacement.  She has returned back to work and doing well.  Surgical scar is healed.  She does have some hardness with a subcutaneous tissue underneath the surgical scar.  This is not tender or complicated.  Reassurance was given that this should improve with scar massage and with continued healing.  In terms activity she is doing great.  She has no complaints.  Leg lengths are equal.  At this point we will see her back in another 3 months with standing AP pelvis x-rays.  Follow-Up Instructions: Return in about 3 months (around 03/02/2020).   Orders:  No orders of the defined types were placed in this encounter.  No orders of the defined types were placed in this encounter.   Imaging: No results found.  PMFS History: Patient Active Problem List   Diagnosis Date Noted   Status post total replacement of right hip 08/31/2019   Avascular necrosis of bone of right hip (HCC) 08/05/2019   Elevated blood pressure reading in office without diagnosis of hypertension 08/05/2019   Attention deficit hyperactivity disorder (ADHD) 06/22/2015   Past Medical History:  Diagnosis Date   ADHD (attention deficit hyperactivity disorder)    Arthritis    Ectopic pregnancy    Hypertension     Family History  Problem Relation Age of Onset   Hypertension Father    Heart disease Father    Healthy Mother     Past Surgical History:  Procedure Laterality Date   TOTAL HIP ARTHROPLASTY Right 08/31/2019   Procedure: RIGHT TOTAL HIP ARTHROPLASTY ANTERIOR APPROACH;  Surgeon: Tarry Kos, MD;   Location: [facility] OR;  Service: Orthopedics;  Laterality: Right;   WISDOM TOOTH EXTRACTION     Social History   Occupational History   Occupation: Office manager    Comment: [employer]   Occupation: blogger    Comment: beer blog for [employer]  Tobacco Use   Smoking status: Former Smoker    Packs/day: 1.00    Years: 23.00    Pack years: 23.00    Quit date: 05/28/2017    Years since quitting: 2.5   Smokeless tobacco: Never Used  Vaping Use   Vaping Use: Never used  Substance and Sexual Activity   Alcohol use: Yes    Alcohol/week: 0.0 standard drinks    Comment: 4 drinks per day   Drug use: No   Sexual activity: Yes    Partners: Male    Birth control/protection: None    Comment: trying to become pregnant

## 2019-12-09 ENCOUNTER — Other Ambulatory Visit: Payer: Self-pay | Admitting: Registered Nurse

## 2019-12-09 DIAGNOSIS — R030 Elevated blood-pressure reading, without diagnosis of hypertension: Secondary | ICD-10-CM

## 2019-12-16 ENCOUNTER — Other Ambulatory Visit: Payer: Self-pay | Admitting: Registered Nurse

## 2019-12-16 DIAGNOSIS — M87051 Idiopathic aseptic necrosis of right femur: Secondary | ICD-10-CM

## 2019-12-16 NOTE — Telephone Encounter (Signed)
Patient last seen Ortho on 12/01/19 and last seen by you on 08/02/19. For the same problem do patient need to get this refill from Ortho or are you ok with filling this

## 2019-12-18 MED ORDER — OXYCODONE HCL 5 MG PO TABS: 5.0000 mg | ORAL_TABLET | Freq: Three times a day (TID) | ORAL | 0 refills | Status: DC | PRN

## 2019-12-28 ENCOUNTER — Other Ambulatory Visit: Payer: Self-pay | Admitting: Registered Nurse

## 2019-12-28 DIAGNOSIS — M5441 Lumbago with sciatica, right side: Secondary | ICD-10-CM

## 2020-01-05 ENCOUNTER — Other Ambulatory Visit: Payer: Self-pay | Admitting: Registered Nurse

## 2020-01-05 DIAGNOSIS — M87051 Idiopathic aseptic necrosis of right femur: Secondary | ICD-10-CM

## 2020-01-05 MED ORDER — OXYCODONE HCL 5 MG PO TABS: 5.0000 mg | ORAL_TABLET | Freq: Three times a day (TID) | ORAL | 0 refills | Status: DC | PRN

## 2020-01-05 NOTE — Telephone Encounter (Signed)
Patient is requesting a refill of the following medications: Requested Prescriptions   Pending Prescriptions Disp Refills   oxyCODONE (OXY IR/ROXICODONE) 5 MG immediate release tablet 30 tablet 0    Sig: Take 1-2 tablets (5-10 mg total) by mouth every 8 (eight) hours as needed for severe pain.    Date of patient request: 01/05/20 Last office visit:08/05/19 Date of last refill: 12/18/19 Last refill amount: 30 Follow up time period per chart: 03/02/20 by Ortho

## 2020-01-19 ENCOUNTER — Other Ambulatory Visit: Payer: Self-pay | Admitting: Registered Nurse

## 2020-01-19 DIAGNOSIS — M87051 Idiopathic aseptic necrosis of right femur: Secondary | ICD-10-CM

## 2020-01-19 MED ORDER — OXYCODONE HCL 5 MG PO TABS: 5.0000 mg | ORAL_TABLET | Freq: Three times a day (TID) | ORAL | 0 refills | Status: DC | PRN

## 2020-01-19 NOTE — Telephone Encounter (Signed)
Patient is requesting a refill of the following medications: Requested Prescriptions   Pending Prescriptions Disp Refills   oxyCODONE (OXY IR/ROXICODONE) 5 MG immediate release tablet 30 tablet 0    Sig: Take 1-2 tablets (5-10 mg total) by mouth every 8 (eight) hours as needed for severe pain.    Date of patient request: 01/19/20 Last office visit: 08/05/19 Date of last refill: 01/05/20 Last refill amount: 30 Follow up time period per chart: 03/02/20

## 2020-01-19 NOTE — Telephone Encounter (Signed)
Patient is requesting a refill of the following medications: Requested Prescriptions   Pending Prescriptions Disp Refills   oxyCODONE (OXY IR/ROXICODONE) 5 MG immediate release tablet 30 tablet 0    Sig: Take 1-2 tablets (5-10 mg total) by mouth every 8 (eight) hours as needed for severe pain.    Date of patient request: 01/19/2020 Last office visit: 08/05/2019 Date of last refill: 01/05/2020 Last refill amount: 30

## 2020-01-20 DIAGNOSIS — F902 Attention-deficit hyperactivity disorder, combined type: Secondary | ICD-10-CM | POA: Diagnosis not present

## 2020-02-02 ENCOUNTER — Other Ambulatory Visit: Payer: Self-pay | Admitting: Registered Nurse

## 2020-02-02 DIAGNOSIS — M87051 Idiopathic aseptic necrosis of right femur: Secondary | ICD-10-CM

## 2020-02-03 NOTE — Telephone Encounter (Signed)
LVM for patient to call back and set up OV

## 2020-02-05 NOTE — Telephone Encounter (Signed)
Patient is requesting a refill of the following medications: Requested Prescriptions   Pending Prescriptions Disp Refills  . oxyCODONE (OXY IR/ROXICODONE) 5 MG immediate release tablet 30 tablet 0    Sig: Take 1-2 tablets (5-10 mg total) by mouth every 8 (eight) hours as needed for severe pain.    Date of patient request: 02/05/20 Last office visit: 08/05/19 Date of last refill: 01/19/20 Last refill amount: 30 Follow up time period per chart: NONE- we called to set one up no answer

## 2020-02-07 ENCOUNTER — Other Ambulatory Visit: Payer: Self-pay | Admitting: Registered Nurse

## 2020-02-07 DIAGNOSIS — M87051 Idiopathic aseptic necrosis of right femur: Secondary | ICD-10-CM

## 2020-02-08 NOTE — Telephone Encounter (Signed)
Patient is requesting a refill of the following medications: Requested Prescriptions   Pending Prescriptions Disp Refills  . oxyCODONE (OXY IR/ROXICODONE) 5 MG immediate release tablet 30 tablet 0    Sig: Take 1-2 tablets (5-10 mg total) by mouth every 8 (eight) hours as needed for severe pain.    Date of patient request:02/07/2020 Last office visit: 08/05/2019 Date of last refill: 01/19/2020 Last refill amount: 30 tablets Follow up time period per chart: N/A

## 2020-02-09 MED ORDER — OXYCODONE HCL 5 MG PO TABS: 5.0000 mg | ORAL_TABLET | Freq: Three times a day (TID) | ORAL | 0 refills | Status: DC | PRN

## 2020-02-10 ENCOUNTER — Other Ambulatory Visit: Payer: Self-pay | Admitting: Registered Nurse

## 2020-02-10 DIAGNOSIS — R030 Elevated blood-pressure reading, without diagnosis of hypertension: Secondary | ICD-10-CM

## 2020-02-23 ENCOUNTER — Other Ambulatory Visit: Payer: Self-pay | Admitting: Registered Nurse

## 2020-02-23 DIAGNOSIS — M87051 Idiopathic aseptic necrosis of right femur: Secondary | ICD-10-CM

## 2020-02-23 MED ORDER — TRAMADOL HCL 50 MG PO TABS: 50.0000 mg | ORAL_TABLET | Freq: Three times a day (TID) | ORAL | 0 refills | Status: DC | PRN

## 2020-02-23 NOTE — Telephone Encounter (Signed)
Patient was last seen by ortho om 12/01/19 due to see Ortho surgeon on 03/02/20  Patient is requesting a refill of the following medications: Requested Prescriptions   Pending Prescriptions Disp Refills   oxyCODONE (OXY IR/ROXICODONE) 5 MG immediate release tablet 30 tablet 0    Sig: Take 1-2 tablets (5-10 mg total) by mouth every 8 (eight) hours as needed for severe pain.    Date of patient request: 02/23/20 Last office visit: 08/21/19 Date of last refill: 02/09/20 Last refill amount: 30 Follow up time period per chart:03/02/20

## 2020-02-24 NOTE — Telephone Encounter (Signed)
I do not have security to refuse medication. ]  Thanks.

## 2020-03-02 ENCOUNTER — Ambulatory Visit: Payer: BLUE CROSS/BLUE SHIELD | Admitting: Orthopaedic Surgery

## 2020-03-02 ENCOUNTER — Encounter: Payer: Self-pay | Admitting: Orthopaedic Surgery

## 2020-03-03 ENCOUNTER — Other Ambulatory Visit: Payer: Self-pay | Admitting: Registered Nurse

## 2020-03-03 DIAGNOSIS — M87051 Idiopathic aseptic necrosis of right femur: Secondary | ICD-10-CM

## 2020-03-04 MED ORDER — TRAMADOL HCL 50 MG PO TABS: 50.0000 mg | ORAL_TABLET | Freq: Three times a day (TID) | ORAL | 0 refills | Status: DC | PRN

## 2020-03-04 NOTE — Telephone Encounter (Signed)
Patient is requesting a refill of the following medications: Requested Prescriptions   Pending Prescriptions Disp Refills   traMADol (ULTRAM) 50 MG tablet 20 tablet 0    Sig: Take 1 tablet (50 mg total) by mouth 3 (three) times daily as needed.    Date of patient request: 03/04/2020 Last office visit: 08/05/2019 Date of last refill: 02/23/2020 Last refill amount: 20 Follow up time period per chart: N/A

## 2020-03-17 ENCOUNTER — Other Ambulatory Visit: Payer: Self-pay | Admitting: Registered Nurse

## 2020-03-17 DIAGNOSIS — M87051 Idiopathic aseptic necrosis of right femur: Secondary | ICD-10-CM

## 2020-03-18 NOTE — Telephone Encounter (Signed)
Patient is requesting a refill of the following medications: Requested Prescriptions   Pending Prescriptions Disp Refills   traMADol (ULTRAM) 50 MG tablet 20 tablet 0    Sig: Take 1 tablet (50 mg total) by mouth 3 (three) times daily as needed.    Date of patient request: 03/17/2020 Last office visit: 08/05/2019 Date of last refill: 03/04/2020 Last refill amount: 20 tab Follow up time period per chart: none specified

## 2020-03-22 MED ORDER — TRAMADOL HCL 50 MG PO TABS: 50.0000 mg | ORAL_TABLET | Freq: Three times a day (TID) | ORAL | 0 refills | Status: DC | PRN

## 2020-03-29 ENCOUNTER — Ambulatory Visit: Payer: BLUE CROSS/BLUE SHIELD | Admitting: Orthopaedic Surgery

## 2020-04-05 ENCOUNTER — Other Ambulatory Visit: Payer: Self-pay | Admitting: Registered Nurse

## 2020-04-05 DIAGNOSIS — M87051 Idiopathic aseptic necrosis of right femur: Secondary | ICD-10-CM

## 2020-04-11 ENCOUNTER — Other Ambulatory Visit: Payer: Self-pay | Admitting: Registered Nurse

## 2020-04-11 DIAGNOSIS — M87051 Idiopathic aseptic necrosis of right femur: Secondary | ICD-10-CM

## 2020-04-13 DIAGNOSIS — F902 Attention-deficit hyperactivity disorder, combined type: Secondary | ICD-10-CM | POA: Diagnosis not present

## 2020-04-15 ENCOUNTER — Other Ambulatory Visit: Payer: Self-pay | Admitting: Registered Nurse

## 2020-04-15 DIAGNOSIS — M87051 Idiopathic aseptic necrosis of right femur: Secondary | ICD-10-CM

## 2020-04-17 ENCOUNTER — Other Ambulatory Visit: Payer: Self-pay | Admitting: Registered Nurse

## 2020-04-17 DIAGNOSIS — R030 Elevated blood-pressure reading, without diagnosis of hypertension: Secondary | ICD-10-CM

## 2020-04-17 NOTE — Telephone Encounter (Signed)
Requested medication (s) are due for refill today: yes  Requested medication (s) are on the active medication list: yes  Last refill:  02/10/20  Future visit scheduled: no  Notes to clinic:  needs appt   Requested Prescriptions  Pending Prescriptions Disp Refills   chlorthalidone (HYGROTON) 25 MG tablet [Pharmacy Med Name: CHLORTHALIDONE 25MG  TABLETS] 30 tablet 1    Sig: TAKE 1 TABLET(25 MG) BY MOUTH DAILY      Cardiovascular: Diuretics - Thiazide Failed - 04/17/2020  3:47 AM      Failed - K in normal range and within 360 days    Potassium  Date Value Ref Range Status  08/27/2019 3.1 (L) 3.5 - 5.1 mmol/L Final          Failed - Last BP in normal range    BP Readings from Last 1 Encounters:  08/31/19 (!) 163/105          Failed - Valid encounter within last 6 months    Recent Outpatient Visits           8 months ago Chronic right-sided low back pain with right-sided sciatica   Primary Care at 09/02/19, Richard, NP   3 years ago Non-intractable vomiting with nausea, unspecified vomiting type   [facility] at [facility], [place], [place]   4 years ago Neck pain   Primary Care at [place], [place], [place]   4 years ago Subacute pansinusitis   Primary Care at [place], [place], [place]              Passed - Ca in normal range and within 360 days    Calcium  Date Value Ref Range Status  08/27/2019 9.1 8.9 - 10.3 mg/dL Final          Passed - Cr in normal range and within 360 days    Creat  Date Value Ref Range Status  05/15/2016 0.67 0.50 - 1.10 mg/dL Final   Creatinine, Ser  Date Value Ref Range Status  08/27/2019 0.70 0.44 - 1.00 mg/dL Final          Passed - Na in normal range and within 360 days    Sodium  Date Value Ref Range Status  08/27/2019 137 135 - 145 mmol/L Final

## 2020-04-18 ENCOUNTER — Other Ambulatory Visit: Payer: Self-pay | Admitting: Registered Nurse

## 2020-04-18 DIAGNOSIS — R030 Elevated blood-pressure reading, without diagnosis of hypertension: Secondary | ICD-10-CM

## 2020-04-18 NOTE — Telephone Encounter (Signed)
Requested medication (s) are due for refill today: yes  Requested medication (s) are on the active medication list: yes  Last refill:  02/10/20  Future visit scheduled: no  Notes to clinic:  needs appt   Requested Prescriptions  Pending Prescriptions Disp Refills   chlorthalidone (HYGROTON) 25 MG tablet [Pharmacy Med Name: CHLORTHALIDONE 25MG  TABLETS] 30 tablet 1    Sig: TAKE 1 TABLET(25 MG) BY MOUTH DAILY      Cardiovascular: Diuretics - Thiazide Failed - 04/18/2020  8:20 AM      Failed - K in normal range and within 360 days    Potassium  Date Value Ref Range Status  08/27/2019 3.1 (L) 3.5 - 5.1 mmol/L Final          Failed - Last BP in normal range    BP Readings from Last 1 Encounters:  08/31/19 (!) 163/105          Failed - Valid encounter within last 6 months    Recent Outpatient Visits           8 months ago Chronic right-sided low back pain with right-sided sciatica   Primary Care at 09/02/19, Richard, NP   3 years ago Non-intractable vomiting with nausea, unspecified vomiting type   Primary Care at [facility], [place], [place]   4 years ago Neck pain   Primary Care at [place], [place], [place]   4 years ago Subacute pansinusitis   Primary Care at [place], [place], [place]              Passed - Ca in normal range and within 360 days    Calcium  Date Value Ref Range Status  08/27/2019 9.1 8.9 - 10.3 mg/dL Final          Passed - Cr in normal range and within 360 days    Creat  Date Value Ref Range Status  05/15/2016 0.67 0.50 - 1.10 mg/dL Final   Creatinine, Ser  Date Value Ref Range Status  08/27/2019 0.70 0.44 - 1.00 mg/dL Final          Passed - Na in normal range and within 360 days    Sodium  Date Value Ref Range Status  08/27/2019 137 135 - 145 mmol/L Final

## 2020-04-18 NOTE — Telephone Encounter (Signed)
Please assist in getting pt scheduled for a follow up appointment. I have attempted to call pt no answer.   Please try again. I have already sent in one cutesy refill.

## 2020-04-19 MED ORDER — TRAMADOL HCL 50 MG PO TABS: 50.0000 mg | ORAL_TABLET | Freq: Three times a day (TID) | ORAL | 0 refills | Status: DC | PRN

## 2020-04-19 NOTE — Telephone Encounter (Signed)
LVMTCB to scheduled med refill

## 2020-05-11 ENCOUNTER — Other Ambulatory Visit: Payer: Self-pay | Admitting: Registered Nurse

## 2020-05-11 DIAGNOSIS — M87051 Idiopathic aseptic necrosis of right femur: Secondary | ICD-10-CM

## 2020-05-11 MED ORDER — TRAMADOL HCL 50 MG PO TABS: 50.0000 mg | ORAL_TABLET | Freq: Three times a day (TID) | ORAL | 0 refills | Status: DC | PRN

## 2020-05-11 NOTE — Telephone Encounter (Signed)
Pt Doesn't have a visit coming up and has not been seen since 08/05/19  Patient is requesting a refill of the following medications: Requested Prescriptions   Pending Prescriptions Disp Refills   traMADol (ULTRAM) 50 MG tablet 20 tablet 0    Sig: Take 1 tablet (50 mg total) by mouth 3 (three) times daily as needed.    Date of patient request: 05/11/20 Last office visit: 08/05/19 Date of last refill: 04/19/20 Last refill amount: 20 + 0 Follow up time period per chart: not yet

## 2020-05-17 ENCOUNTER — Other Ambulatory Visit: Payer: Self-pay | Admitting: Registered Nurse

## 2020-05-17 DIAGNOSIS — R030 Elevated blood-pressure reading, without diagnosis of hypertension: Secondary | ICD-10-CM

## 2020-05-17 NOTE — Telephone Encounter (Signed)
Requested medication (s) are due for refill today: Yes  Requested medication (s) are on the active medication list: Yes  Last refill:  04/18/20  Future visit scheduled: No  Notes to clinic:  Pt. Needs appointment.    Requested Prescriptions  Pending Prescriptions Disp Refills   chlorthalidone (HYGROTON) 25 MG tablet [Pharmacy Med Name: CHLORTHALIDONE 25MG  TABLETS] 30 tablet 1    Sig: TAKE 1 TABLET(25 MG) BY MOUTH DAILY      Cardiovascular: Diuretics - Thiazide Failed - 05/17/2020  8:01 AM      Failed - K in normal range and within 360 days    Potassium  Date Value Ref Range Status  08/27/2019 3.1 (L) 3.5 - 5.1 mmol/L Final          Failed - Last BP in normal range    BP Readings from Last 1 Encounters:  08/31/19 (!) 163/105          Failed - Valid encounter within last 6 months    Recent Outpatient Visits           9 months ago Chronic right-sided low back pain with right-sided sciatica   Primary Care at 09/02/19, Richard, NP   4 years ago Non-intractable vomiting with nausea, unspecified vomiting type   Primary Care at [facility], [place], [place]   4 years ago Neck pain   Primary Care at [place], [place], [place]   5 years ago Subacute pansinusitis   Primary Care at [place], [place], [place]              Passed - Ca in normal range and within 360 days    Calcium  Date Value Ref Range Status  08/27/2019 9.1 8.9 - 10.3 mg/dL Final          Passed - Cr in normal range and within 360 days    Creat  Date Value Ref Range Status  05/15/2016 0.67 0.50 - 1.10 mg/dL Final   Creatinine, Ser  Date Value Ref Range Status  08/27/2019 0.70 0.44 - 1.00 mg/dL Final          Passed - Na in normal range and within 360 days    Sodium  Date Value Ref Range Status  08/27/2019 137 135 - 145 mmol/L Final

## 2020-05-26 ENCOUNTER — Other Ambulatory Visit: Payer: Self-pay | Admitting: Registered Nurse

## 2020-05-26 DIAGNOSIS — M87051 Idiopathic aseptic necrosis of right femur: Secondary | ICD-10-CM

## 2020-05-26 DIAGNOSIS — L299 Pruritus, unspecified: Secondary | ICD-10-CM

## 2020-05-26 MED ORDER — HYDROXYZINE HCL 10 MG PO TABS: 10.0000 mg | ORAL_TABLET | Freq: Three times a day (TID) | ORAL | 0 refills | Status: DC | PRN

## 2020-05-26 MED ORDER — TRAMADOL HCL 50 MG PO TABS: 50.0000 mg | ORAL_TABLET | Freq: Two times a day (BID) | ORAL | 0 refills | Status: DC | PRN

## 2020-05-26 NOTE — Telephone Encounter (Signed)
Patient is requesting a refill of the following medications: Requested Prescriptions   Pending Prescriptions Disp Refills   hydrOXYzine (ATARAX/VISTARIL) 10 MG tablet 30 tablet 0    Sig: Take 1 tablet (10 mg total) by mouth 3 (three) times daily as needed.   traMADol (ULTRAM) 50 MG tablet 20 tablet 0    Sig: Take 1 tablet (50 mg total) by mouth 3 (three) times daily as needed.    Date of patient request: 05/26/20 Last office visit: 05/11/2020 Date of last refill: 05/11/20 tramadol and 11/17/19 on hydroxine Last refill amount: 20 and 30 Follow up time period per chart: none

## 2020-05-27 ENCOUNTER — Encounter: Payer: Self-pay | Admitting: Registered Nurse

## 2020-06-06 ENCOUNTER — Other Ambulatory Visit: Payer: Self-pay | Admitting: Registered Nurse

## 2020-06-06 DIAGNOSIS — R110 Nausea: Secondary | ICD-10-CM

## 2020-06-06 MED ORDER — ONDANSETRON HCL 4 MG PO TABS: 4.0000 mg | ORAL_TABLET | Freq: Three times a day (TID) | ORAL | 0 refills | Status: DC | PRN

## 2020-06-16 ENCOUNTER — Other Ambulatory Visit: Payer: Self-pay | Admitting: Registered Nurse

## 2020-06-16 DIAGNOSIS — M87051 Idiopathic aseptic necrosis of right femur: Secondary | ICD-10-CM

## 2020-06-16 DIAGNOSIS — L299 Pruritus, unspecified: Secondary | ICD-10-CM

## 2020-06-16 NOTE — Telephone Encounter (Signed)
Patient is requesting a refill of the following medications: Requested Prescriptions   Pending Prescriptions Disp Refills   hydrOXYzine (ATARAX/VISTARIL) 10 MG tablet 30 tablet 0    Sig: Take 1 tablet (10 mg total) by mouth 3 (three) times daily as needed.   traMADol (ULTRAM) 50 MG tablet 20 tablet 0    Sig: Take 1 tablet (50 mg total) by mouth every 12 (twelve) hours as needed.    Date of patient request: 06/16/2020 Last office visit: 08/05/2019 Date of last refill: 05/26/2020 Last refill amount: 20

## 2020-06-20 MED ORDER — TRAMADOL HCL 50 MG PO TABS: 50.0000 mg | ORAL_TABLET | Freq: Two times a day (BID) | ORAL | 0 refills | Status: DC | PRN

## 2020-06-20 MED ORDER — HYDROXYZINE HCL 10 MG PO TABS: 10.0000 mg | ORAL_TABLET | Freq: Three times a day (TID) | ORAL | 0 refills | Status: DC | PRN

## 2020-07-06 DIAGNOSIS — F902 Attention-deficit hyperactivity disorder, combined type: Secondary | ICD-10-CM | POA: Diagnosis not present

## 2020-07-10 ENCOUNTER — Other Ambulatory Visit: Payer: Self-pay | Admitting: Registered Nurse

## 2020-07-10 DIAGNOSIS — L299 Pruritus, unspecified: Secondary | ICD-10-CM

## 2020-07-10 DIAGNOSIS — R110 Nausea: Secondary | ICD-10-CM

## 2020-07-10 DIAGNOSIS — M87051 Idiopathic aseptic necrosis of right femur: Secondary | ICD-10-CM

## 2020-07-13 MED ORDER — TRAMADOL HCL 50 MG PO TABS: 50.0000 mg | ORAL_TABLET | Freq: Two times a day (BID) | ORAL | 0 refills | Status: DC | PRN

## 2020-07-13 MED ORDER — HYDROXYZINE HCL 10 MG PO TABS: 10.0000 mg | ORAL_TABLET | Freq: Three times a day (TID) | ORAL | 0 refills | Status: DC | PRN

## 2020-07-13 MED ORDER — ONDANSETRON HCL 4 MG PO TABS: 4.0000 mg | ORAL_TABLET | Freq: Three times a day (TID) | ORAL | 0 refills | Status: DC | PRN

## 2020-07-25 ENCOUNTER — Other Ambulatory Visit: Payer: Self-pay | Admitting: Registered Nurse

## 2020-07-25 DIAGNOSIS — L299 Pruritus, unspecified: Secondary | ICD-10-CM

## 2020-07-25 DIAGNOSIS — M87051 Idiopathic aseptic necrosis of right femur: Secondary | ICD-10-CM

## 2020-08-02 ENCOUNTER — Other Ambulatory Visit: Payer: Self-pay | Admitting: Registered Nurse

## 2020-08-02 ENCOUNTER — Ambulatory Visit: Payer: BLUE CROSS/BLUE SHIELD | Admitting: Orthopaedic Surgery

## 2020-08-02 DIAGNOSIS — L299 Pruritus, unspecified: Secondary | ICD-10-CM

## 2020-08-02 DIAGNOSIS — R110 Nausea: Secondary | ICD-10-CM

## 2020-08-02 DIAGNOSIS — M87051 Idiopathic aseptic necrosis of right femur: Secondary | ICD-10-CM

## 2020-08-04 ENCOUNTER — Other Ambulatory Visit: Payer: Self-pay | Admitting: Registered Nurse

## 2020-08-04 DIAGNOSIS — M87051 Idiopathic aseptic necrosis of right femur: Secondary | ICD-10-CM

## 2020-08-09 ENCOUNTER — Ambulatory Visit (INDEPENDENT_AMBULATORY_CARE_PROVIDER_SITE_OTHER): Payer: BC Managed Care – PPO | Admitting: Orthopaedic Surgery

## 2020-08-09 ENCOUNTER — Other Ambulatory Visit: Payer: Self-pay

## 2020-08-09 ENCOUNTER — Ambulatory Visit: Payer: Self-pay

## 2020-08-09 DIAGNOSIS — Z96641 Presence of right artificial hip joint: Secondary | ICD-10-CM | POA: Diagnosis not present

## 2020-08-09 DIAGNOSIS — Z96642 Presence of left artificial hip joint: Secondary | ICD-10-CM | POA: Insufficient documentation

## 2020-08-09 NOTE — Progress Notes (Signed)
   Post-Op Visit Note   Patient: [name] [name]           Date of Birth: 19-Feb-1981           MRN: [id number] Visit Date: 08/09/2020 PCP: Janeece Agee, NP   Assessment & Plan:  Chief Complaint:  Chief Complaint  Patient presents with  . Left Hip - Pain   Visit Diagnoses:  1. Status post total replacement of right hip     Plan:   [name] [name] is 1 year status post right total hip replacement for avascular necrosis.  She is overall doing well but occasionally with increased standing and working she will have some hip pain around the thigh.  Denies any injuries or trauma or constitutional symptoms.  Right hip shows fully healed surgical scar.  No signs of infection or swelling.  Painless range of motion of the hip.  Slight weakness to resisted hip flexion.  X-rays demonstrate stable total hip replacement without complications.  [name] [name] is doing well overall.  I do think that she can benefit from some hip flexor strengthening.  She will go to the gym on her own.  We will see her back in a year with AP standing x-rays.  Dental prophylaxis reinforced.   Follow-Up Instructions: Return in about 1 year (around 08/09/2021).   Orders:  Orders Placed This Encounter  Procedures  . XR HIP UNILAT W OR W/O PELVIS 2-3 VIEWS LEFT   No orders of the defined types were placed in this encounter.   Imaging: XR HIP UNILAT W OR W/O PELVIS 2-3 VIEWS LEFT  Result Date: 08/09/2020 Stable total hip replacement without complication   PMFS History: Patient Active Problem List   Diagnosis Date Noted  . Status post total replacement of left hip 08/09/2020  . Status post total replacement of right hip 08/31/2019  . Avascular necrosis of bone of right hip (HCC) 08/05/2019  . Elevated blood pressure reading in office without diagnosis of hypertension 08/05/2019  . Attention deficit hyperactivity disorder (ADHD) 06/22/2015   Past Medical History:  Diagnosis Date  . ADHD (attention deficit  hyperactivity disorder)   . Arthritis   . Ectopic pregnancy   . Hypertension     Family History  Problem Relation Age of Onset  . Hypertension Father   . Heart disease Father   . Healthy Mother     Past Surgical History:  Procedure Laterality Date  . TOTAL HIP ARTHROPLASTY Right 08/31/2019   Procedure: RIGHT TOTAL HIP ARTHROPLASTY ANTERIOR APPROACH;  Surgeon: Tarry Kos, MD;  Location: [facility] OR;  Service: Orthopedics;  Laterality: Right;  . WISDOM TOOTH EXTRACTION     Social History   Occupational History  . Occupation: Office manager    Comment: [employer]  . Occupation: blogger    Comment: beer blog for [employer]  Tobacco Use  . Smoking status: Former Smoker    Packs/day: 1.00    Years: 23.00    Pack years: 23.00    Quit date: 05/28/2017    Years since quitting: 3.2  . Smokeless tobacco: Never Used  Vaping Use  . Vaping Use: Never used  Substance and Sexual Activity  . Alcohol use: Yes    Alcohol/week: 0.0 standard drinks    Comment: 4 drinks per day  . Drug use: No  . Sexual activity: Yes    Partners: Male    Birth control/protection: None    Comment: trying to become pregnant

## 2020-08-18 ENCOUNTER — Other Ambulatory Visit: Payer: Self-pay | Admitting: Registered Nurse

## 2020-08-18 DIAGNOSIS — R030 Elevated blood-pressure reading, without diagnosis of hypertension: Secondary | ICD-10-CM

## 2020-08-18 NOTE — Telephone Encounter (Signed)
Requested medications are due for refill today yes  Requested medications are on the active medication list yes  Last refill 07/23/20  Last visit 07/2019  Future visit scheduled no  Notes to clinic Has already had a curtesy refill and there is no upcoming appointment scheduled. Failed protocol due to no valid visit within 6  months.

## 2020-08-22 ENCOUNTER — Other Ambulatory Visit: Payer: Self-pay | Admitting: Registered Nurse

## 2020-08-22 DIAGNOSIS — R030 Elevated blood-pressure reading, without diagnosis of hypertension: Secondary | ICD-10-CM

## 2020-08-22 DIAGNOSIS — M87051 Idiopathic aseptic necrosis of right femur: Secondary | ICD-10-CM

## 2020-08-22 NOTE — Telephone Encounter (Signed)
Requested medication (s) are due for refill today:   Yes  Requested medication (s) are on the active medication list:   Yes  Future visit scheduled:   No   Last ordered: 08/18/2020 #30 with 1 refill  Clinic note:  Returned because protocol failed.   Labs due and has not been seen in a yr.  Appt needed.   [facility] does not schedule for this practice.   Requested Prescriptions  Pending Prescriptions Disp Refills   chlorthalidone (HYGROTON) 25 MG tablet [Pharmacy Med Name: CHLORTHALIDONE 25MG  TABLETS] 90 tablet     Sig: TAKE 1 TABLET(25 MG) BY MOUTH DAILY      Cardiovascular: Diuretics - Thiazide Failed - 08/22/2020  7:45 AM      Failed - Ca in normal range and within 360 days    Calcium  Date Value Ref Range Status  08/27/2019 9.1 8.9 - 10.3 mg/dL Final          Failed - Cr in normal range and within 360 days    Creat  Date Value Ref Range Status  05/15/2016 0.67 0.50 - 1.10 mg/dL Final   Creatinine, Ser  Date Value Ref Range Status  08/27/2019 0.70 0.44 - 1.00 mg/dL Final          Failed - K in normal range and within 360 days    Potassium  Date Value Ref Range Status  08/27/2019 3.1 (L) 3.5 - 5.1 mmol/L Final          Failed - Na in normal range and within 360 days    Sodium  Date Value Ref Range Status  08/27/2019 137 135 - 145 mmol/L Final          Failed - Last BP in normal range    BP Readings from Last 1 Encounters:  08/31/19 (!) 163/105          Failed - Valid encounter within last 6 months    Recent Outpatient Visits           1 year ago Chronic right-sided low back pain with right-sided sciatica   Primary Care at 09/02/19, Richard, NP   4 years ago Non-intractable vomiting with nausea, unspecified vomiting type   Primary Care at [facility], [place], [place]   5 years ago Neck pain   Primary Care at [place], [place], [place]   5 years ago Subacute pansinusitis   Primary Care at [place], [place], [place]       Future Appointments              In 11 months [place], MD [facility]

## 2020-08-22 NOTE — Telephone Encounter (Signed)
Requested medications are due for refill today.  yes  Requested medications are on the active medications list.  yes  Last refill. 07/13/2020  Future visit scheduled.   no  Notes to clinic.  Not delegated

## 2020-08-25 ENCOUNTER — Other Ambulatory Visit: Payer: Self-pay | Admitting: Registered Nurse

## 2020-08-25 DIAGNOSIS — L299 Pruritus, unspecified: Secondary | ICD-10-CM

## 2020-08-25 NOTE — Telephone Encounter (Signed)
Please schedule appt first before we send in a 30 day refill. Patient have not been seen in 1 year

## 2020-08-25 NOTE — Telephone Encounter (Signed)
Left message on patient's voicemail to ask for call back to schedule appointment for med refill.

## 2020-08-25 NOTE — Telephone Encounter (Signed)
Requested medication (s) are due for refill today yes  Requested medication (s) are on the active medication list -yes  Future visit scheduled -no  Last refill: 07/13/20 [place]  Notes to clinic: Attempted to call patient to schedule appointment- left message to call office. Refill request sent for review  Requested Prescriptions  Pending Prescriptions Disp Refills   hydrOXYzine (ATARAX/VISTARIL) 10 MG tablet [Pharmacy Med Name: HYDROXYZINE HCL 10MG  TABLETS] 30 tablet 0    Sig: TAKE 1 TABLET(10 MG) BY MOUTH THREE TIMES DAILY AS NEEDED      Ear, Nose, and Throat:  Antihistamines Failed - 08/25/2020  4:14 PM      Failed - Valid encounter within last 12 months    Recent Outpatient Visits           1 year ago Chronic right-sided low back pain with right-sided sciatica   Primary Care at 10/25/2020, Richard, NP   4 years ago Non-intractable vomiting with nausea, unspecified vomiting type   Primary Care at [facility], [place], [place]   5 years ago Neck pain   Primary Care at [facility], [place], [place]   5 years ago Subacute pansinusitis   Primary Care at [place], [place], [place]       Future Appointments             In 11 months [place], Roda Shutters, MD [facility] Orth[facility]                 Requested Prescriptions  Pending Prescriptions Disp Refills   hydrOXYzine (ATARAX/VISTARIL) 10 MG tablet [Pharmacy Med Name: HYDROXYZINE HCL 10MG  TABLETS] 30 tablet 0    Sig: TAKE 1 TABLET(10 MG) BY MOUTH THREE TIMES DAILY AS NEEDED      Ear, Nose, and Throat:  Antihistamines Failed - 08/25/2020  4:14 PM      Failed - Valid encounter within last 12 months    Recent Outpatient Visits           1 year ago Chronic right-sided low back pain with right-sided sciatica   Primary Care at , Richard, NP   4 years ago Non-intractable vomiting with nausea, unspecified vomiting type   Primary Care at 10/25/2020, [place], [place]   5 years ago Neck pain   Primary  Care at [place] at [place], [place], [place]   5 years ago Subacute pansinusitis   Primary Care at [place], [place], [place]       Future Appointments             In 11 months [place], MD [facility] Ortho [facility]

## 2020-09-05 ENCOUNTER — Telehealth: Payer: BC Managed Care – PPO | Admitting: Physician Assistant

## 2020-09-05 ENCOUNTER — Ambulatory Visit: Payer: BLUE CROSS/BLUE SHIELD | Admitting: Registered Nurse

## 2020-09-05 DIAGNOSIS — K2990 Gastroduodenitis, unspecified, without bleeding: Secondary | ICD-10-CM

## 2020-09-05 DIAGNOSIS — R82998 Other abnormal findings in urine: Secondary | ICD-10-CM

## 2020-09-05 DIAGNOSIS — R112 Nausea with vomiting, unspecified: Secondary | ICD-10-CM

## 2020-09-05 NOTE — Progress Notes (Signed)
Based on what you shared with me, I feel your condition warrants further evaluation and I recommend that you be seen for a face to face office visit. Giving concern for gastritis and potential ulceration, coupled with emesis and dark urine, you need to be seen in person. This is so you can get a good assessment including abdominal examination, labs, urine testing to make sure the most appropriate treatment is given and that nothing is being missed. They need to make sure there is no concern of kidnye injury from dehydration and that IV fluids are not needed. Please avoid use of any more Ibuprofen or related products and be seen at nearest Urgent Care this evening.    NOTE: If you entered your credit card information for this eVisit, you will not be charged. You may see a "hold" on your card for the $35 but that hold will drop off and you will not have a charge processed.   If you are having a true medical emergency please call 911.      For an urgent face to face visit, [facility] has five urgent care centers for your convenience:     [facility] at [facility] Directions [phone] [address] [place], [place] [address] . 10 am - 6pm Monday - Friday    [facility]) Get Driving Directions [phone] [address] [place], [place] [address] . 10 am to 8 pm Monday-Friday . 12 pm to 8 pm [facility] Urgent Care at [facility] Get Driving Directions [phone] [address] [place], [place] [address] . 8 am to 8 pm Monday-Friday . 9 am to 6 pm Saturday . 11 am to 6 pm Sunday     [facility] at [facility] Get Driving Directions  [phone] [address] [place], [place] [address] . 8 am to 8 pm Monday-Friday . 8 am to 4 pm [facility] Urgent Care at [facility] Directions [phone] [address] [place], [place] [address] . 12 pm to 6 pm Monday-Friday      Your e-visit answers were reviewed by a board certified advanced clinical practitioner to complete your personal care plan.  Thank you for using e-Visits.

## 2020-10-10 DIAGNOSIS — F902 Attention-deficit hyperactivity disorder, combined type: Secondary | ICD-10-CM | POA: Diagnosis not present

## 2020-10-28 ENCOUNTER — Other Ambulatory Visit: Payer: Self-pay | Admitting: Registered Nurse

## 2020-10-28 DIAGNOSIS — R030 Elevated blood-pressure reading, without diagnosis of hypertension: Secondary | ICD-10-CM

## 2020-10-28 NOTE — Telephone Encounter (Signed)
Pt needs OV 

## 2021-01-25 DIAGNOSIS — F902 Attention-deficit hyperactivity disorder, combined type: Secondary | ICD-10-CM | POA: Diagnosis not present

## 2021-01-30 ENCOUNTER — Other Ambulatory Visit: Payer: Self-pay | Admitting: Registered Nurse

## 2021-01-30 DIAGNOSIS — R030 Elevated blood-pressure reading, without diagnosis of hypertension: Secondary | ICD-10-CM

## 2021-04-19 DIAGNOSIS — F902 Attention-deficit hyperactivity disorder, combined type: Secondary | ICD-10-CM | POA: Diagnosis not present

## 2021-06-17 ENCOUNTER — Ambulatory Visit (INDEPENDENT_AMBULATORY_CARE_PROVIDER_SITE_OTHER): Payer: BC Managed Care – PPO

## 2021-06-17 ENCOUNTER — Other Ambulatory Visit: Payer: Self-pay

## 2021-06-17 ENCOUNTER — Encounter (HOSPITAL_COMMUNITY): Payer: Self-pay

## 2021-06-17 ENCOUNTER — Ambulatory Visit (HOSPITAL_COMMUNITY)
Admission: EM | Admit: 2021-06-17 | Discharge: 2021-06-17 | Disposition: A | Discharge status: Home / Self Care | Payer: BC Managed Care – PPO | Attending: Physician Assistant | Admitting: Physician Assistant

## 2021-06-17 DIAGNOSIS — R059 Cough, unspecified: Secondary | ICD-10-CM | POA: Diagnosis not present

## 2021-06-17 DIAGNOSIS — J40 Bronchitis, not specified as acute or chronic: Secondary | ICD-10-CM

## 2021-06-17 DIAGNOSIS — J329 Chronic sinusitis, unspecified: Secondary | ICD-10-CM | POA: Diagnosis not present

## 2021-06-17 MED ORDER — PREDNISONE 20 MG PO TABS: 40.0000 mg | ORAL_TABLET | Freq: Every day | ORAL | 0 refills | Status: AC

## 2021-06-17 MED ORDER — METHYLPREDNISOLONE ACETATE 80 MG/ML IJ SUSP
INTRAMUSCULAR | Status: AC
  Filled 2021-06-17: qty 1

## 2021-06-17 MED ORDER — METHYLPREDNISOLONE ACETATE 80 MG/ML IJ SUSP
60.0000 mg | Freq: Once | INTRAMUSCULAR | Status: AC
  Administered 2021-06-17: 60 mg via INTRAMUSCULAR

## 2021-06-17 MED ORDER — ALBUTEROL SULFATE HFA 108 (90 BASE) MCG/ACT IN AERS
2.0000 | INHALATION_SPRAY | Freq: Once | RESPIRATORY_TRACT | Status: AC
  Administered 2021-06-17: 2 via RESPIRATORY_TRACT

## 2021-06-17 MED ORDER — AMOXICILLIN-POT CLAVULANATE 875-125 MG PO TABS: 1.0000 | ORAL_TABLET | Freq: Two times a day (BID) | ORAL | 0 refills | Status: DC

## 2021-06-17 MED ORDER — ALBUTEROL SULFATE HFA 108 (90 BASE) MCG/ACT IN AERS
INHALATION_SPRAY | RESPIRATORY_TRACT | Status: AC
  Filled 2021-06-17: qty 6.7

## 2021-06-17 NOTE — ED Provider Notes (Signed)
[place]    CSN: [id number] Arrival date & time: 06/17/21  1313      History   Chief Complaint Chief Complaint  Patient presents with   Sore Throat    HPI [name] [name] is a 40 y.o. female.   Patient presents today with a 1 week plus history of URI symptoms.  Reports sore throat, nasal congestion, cough, chest tightness, shortness of breath, nausea/vomiting.  She was experiencing chest discomfort which she attributed to severity of coughing.  She has tried over-the-counter medications including multisymptom medication and ibuprofen as well as warm showers with minimal improvement of symptoms.  She has had COVID-19 vaccines but has not had booster.  Had COVID in June 2022.  She denies any recent antibiotic use.  She denies history of asthma, allergies, COPD.  She is a former smoker who quit several years ago.  She is having difficulty with daily activities as result of symptoms.  Reports chest tightness and pain has improved today but continues to be bothersome.  She is confident she is not pregnant.   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   Status post total replacement of right hip 08/31/2019   Avascular necrosis of bone of right hip ([facility]) 08/05/2019   Elevated blood pressure reading in office without diagnosis of hypertension 08/05/2019   Attention deficit hyperactivity disorder (ADHD) 06/22/2015    Past Surgical History:  Procedure Laterality Date   TOTAL HIP ARTHROPLASTY Right 08/31/2019   Procedure: RIGHT TOTAL HIP ARTHROPLASTY ANTERIOR APPROACH;  Surgeon: Leandrew Koyanagi, MD;  Location: [facility];  Service: Orthopedics;  Laterality: Right;   WISDOM TOOTH EXTRACTION      OB History     Gravida  1   Para      Term      Preterm      AB       Living         SAB      IAB      Ectopic      Multiple      Live Births               Home Medications    Prior to Admission medications   Medication Sig Start Date End Date Taking? Authorizing Provider  amoxicillin-clavulanate (AUGMENTIN) 875-125 MG tablet Take 1 tablet by mouth every 12 (twelve) hours. 06/17/21  Yes Lauriann Milillo K, PA-C  cyclobenzaprine (FLEXERIL) 10 MG tablet TAKE 1 TABLET(10 MG) BY MOUTH THREE TIMES DAILY AS NEEDED FOR MUSCLE SPASMS 11/09/19   Maximiano Coss, NP  predniSONE (DELTASONE) 20 MG tablet Take 2 tablets (40 mg total) by mouth daily for 4 days. 06/17/21 06/21/21 Yes Chyanne Kohut K, PA-C  amphetamine-dextroamphetamine (ADDERALL XR) 25 MG 24 hr capsule Take 25 mg by mouth every morning.    [provider]  amphetamine-dextroamphetamine (ADDERALL XR) 30 MG 24 hr capsule Take 30 mg by mouth daily in the afternoon.     [provider]  chlorthalidone (HYGROTON) 25 MG tablet TAKE 1 TABLET(25 MG) BY MOUTH DAILY 10/28/20   Maximiano Coss, NP  hydrOXYzine (ATARAX/VISTARIL) 10 MG tablet Take 1 tablet (10 mg total) by mouth 3 (three) times daily as needed. 07/13/20   Maximiano Coss, NP  meloxicam (MOBIC) 15 MG tablet TAKE 1 TABLET(15 MG) BY MOUTH DAILY 12/28/19   Maximiano Coss, NP  methocarbamol (ROBAXIN) 500 MG tablet Take 1 tablet (500 mg total) by mouth 2 (two) times daily as needed. 08/26/19   Aundra Dubin, PA-C  ondansetron (ZOFRAN) 4 MG tablet Take 1 tablet (4 mg total) by mouth every 8 (eight) hours as needed for nausea or vomiting. 07/13/20   Maximiano Coss, NP  oxyCODONE (OXY IR/ROXICODONE) 5 MG immediate release tablet Take 1-2 tablets (5-10 mg total) by mouth every 8 (eight) hours as needed for severe pain. 02/09/20   Maximiano Coss, NP  polyethylene glycol (MIRALAX) 17 g packet Take 17 g by mouth daily as needed. 08/26/19   Aundra Dubin, PA-C  traMADol (ULTRAM) 50 MG tablet Take 1 tablet (50 mg total) by mouth every 12  (twelve) hours as needed. 07/13/20   Maximiano Coss, NP    Family History Family History  Problem Relation Age of Onset   Hypertension Father    Heart disease Father    Healthy Mother     Social History Social History   Tobacco Use   Smoking status: Former    Packs/day: 1.00    Years: 23.00    Pack years: 23.00    Types: Cigarettes    Quit date: 05/28/2017    Years since quitting: 4.0   Smokeless tobacco: Never  Vaping Use   Vaping Use: Never used  Substance Use Topics   Alcohol use: Yes    Alcohol/week: 0.0 standard drinks    Comment: 4 drinks per day   Drug use: No     Allergies   Toradol [ketorolac tromethamine]   Review of Systems Review of Systems  Constitutional:  Positive for activity change and fatigue. Negative for appetite change and fever.  HENT:  Positive for congestion, sinus pressure and sore throat. Negative for sneezing.   Respiratory:  Positive for cough, chest tightness, shortness of breath and wheezing.   Cardiovascular:  Negative for chest pain.  Gastrointestinal:  Positive for nausea and vomiting. Negative for abdominal pain and diarrhea.  Musculoskeletal:  Negative for arthralgias and myalgias.  Neurological:  Positive for headaches. Negative for dizziness and light-headedness.    Physical Exam Triage Vital Signs ED Triage Vitals  Enc Vitals Group     BP 06/17/21 1358 (!) 130/94     Pulse Rate 06/17/21 1358 (!) 107     Resp 06/17/21 1358 19     Temp 06/17/21 1358 97.7 F (36.5 C)     Temp Source 06/17/21 1358 Oral     SpO2 06/17/21 1358 96 %     Weight --      Height --      Head Circumference --      Peak Flow --      Pain Score 06/17/21 1357 3     Pain Loc --      Pain Edu? --      Excl. in [place]? --    No data found.  Updated Vital Signs BP (!) 130/94 (BP Location: Right Arm)    Pulse (!) 107    Temp 97.7 F (36.5 C) (Oral)    Resp 19    LMP 05/23/2021 (Exact Date)    SpO2 96%   Visual Acuity Right Eye Distance:   Left  Eye Distance:   Bilateral Distance:    Right Eye Near:   Left Eye Near:    Bilateral Near:  Physical Exam Vitals reviewed.  Constitutional:      General: She is awake. She is not in acute distress.    Appearance: Normal appearance. She is well-developed. She is not ill-appearing.     Comments: Very pleasant female appears stated age in no acute distress sitting comfortably in exam room  HENT:     Head: Normocephalic and atraumatic.     Right Ear: Ear canal and external ear normal. Tympanic membrane is scarred. Tympanic membrane is not erythematous or bulging.     Left Ear: Tympanic membrane, ear canal and external ear normal. Tympanic membrane is not erythematous or bulging.     Nose:     Right Sinus: Maxillary sinus tenderness present. No frontal sinus tenderness.     Left Sinus: Maxillary sinus tenderness present. No frontal sinus tenderness.     Mouth/Throat:     Pharynx: Uvula midline. Posterior oropharyngeal erythema present. No oropharyngeal exudate.     Comments: Erythema and drainage in posterior oropharynx Cardiovascular:     Rate and Rhythm: Normal rate and regular rhythm.     Heart sounds: Normal heart sounds, S1 normal and S2 normal. No murmur heard. Pulmonary:     Effort: Pulmonary effort is normal.     Breath sounds: Rhonchi present. No wheezing or rales.     Comments: Scattered rhonchi clear with cough.  Reactive cough with deep breathing. Chest:     Chest wall: Tenderness present. No deformity or swelling.     Comments: Pain reproducible on exam. Psychiatric:        Behavior: Behavior is cooperative.     UC Treatments / Results  Labs (all labs ordered are listed, but only abnormal results are displayed) Labs Reviewed - No data to display  EKG   Radiology DG Chest 2 View  Result Date: 06/17/2021 CLINICAL DATA:  Cough for 1 week.  History of smoking. EXAM: CHEST - 2 VIEW COMPARISON:  08/27/2019 FINDINGS: The cardiomediastinal silhouette is within  normal limits. The lungs are well inflated and clear. There is no evidence of pleural effusion or pneumothorax. No acute osseous abnormality is identified. IMPRESSION: No active cardiopulmonary disease. Electronically Signed   By: Sebastian AcheAllen  Grady M.D.   On: 06/17/2021 14:42    Procedures Procedures (including critical care time)  Medications Ordered in UC Medications  albuterol (VENTOLIN HFA) 108 (90 Base) MCG/ACT inhaler 2 puff (2 puffs Inhalation Given 06/17/21 1437)  methylPREDNISolone acetate (DEPO-MEDROL) injection 60 mg (60 mg Intramuscular Given 06/17/21 1437)    Initial Impression / Assessment and Plan / UC Course  I have reviewed the triage vital signs and the nursing notes.  Pertinent labs & imaging results that were available during my care of the patient were reviewed by me and considered in my medical decision making (see chart for details).     Chest discomfort was reproducible on exam consistent with costochondritis.  Low suspicion for PE given reproducible pain on exam but discussed that if symptoms not improving quickly or if at any point anything worsen she needs to go to the emergency room.  X-ray was obtained given prolonged and worsening symptoms that showed no cardiopulmonary disease.  Given prolonged and worsening symptoms patient was started on Augmentin twice daily for 7 days.  She was given Depo-Medrol and albuterol in clinic with improvement but not resolution of symptoms.  She was sent home with albuterol and encouraged to use this every 4-6 hours as needed.  We will start prednisone burst tomorrow and she  was instructed not to take NSAIDs with this medication due to risk of GI bleeding.  Can use Mucinex, Flonase, Tylenol for symptom relief.  She is to rest and drink plenty of fluid.  Discussed alarm symptoms that warrant emergent evaluation including recurrent chest pain, shortness of breath not responding to medication, fever not responding to medication, nausea/vomiting  interfering with oral intake.  Strict return precautions given to which she expressed understanding.  Work note provided as requested.  Final Clinical Impressions(s) / UC Diagnoses   Final diagnoses:  Sinobronchitis     Discharge Instructions      Your x-ray was normal.  Please start Augmentin twice daily for 7 days to cover for infection.  I have called in prednisone burst (40 mg for 4 days) to help with your symptoms and I would like you to start tomorrow (06/18/2021).  Do not take NSAIDs including aspirin, ibuprofen/Advil, naproxen/Aleve with this medication as it can cause stomach bleeding.  You can use Tylenol, Mucinex, Flonase for symptom relief.  If you have any worsening symptoms including heart racing, recurrent chest pain, shortness of breath, fever or swelling to medication, nausea/vomiting interfering with oral intake you need to go to the emergency room.  If symptoms not improving within a few days please return for reevaluation as we discussed.     ED Prescriptions     Medication Sig Dispense Auth. Provider   predniSONE (DELTASONE) 20 MG tablet Take 2 tablets (40 mg total) by mouth daily for 4 days. 8 tablet Tauren Delbuono K, PA-C   amoxicillin-clavulanate (AUGMENTIN) 875-125 MG tablet Take 1 tablet by mouth every 12 (twelve) hours. 14 tablet Skylee Baird, Noberto Retort, PA-C      PDMP not reviewed this encounter.   Jeani Hawking, PA-C 06/17/21 1517

## 2021-06-17 NOTE — Discharge Instructions (Addendum)
Your x-ray was normal.  Please start Augmentin twice daily for 7 days to cover for infection.  I have called in prednisone burst (40 mg for 4 days) to help with your symptoms and I would like you to start tomorrow (06/18/2021).  Do not take NSAIDs including aspirin, ibuprofen/Advil, naproxen/Aleve with this medication as it can cause stomach bleeding.  You can use Tylenol, Mucinex, Flonase for symptom relief.  If you have any worsening symptoms including heart racing, recurrent chest pain, shortness of breath, fever or swelling to medication, nausea/vomiting interfering with oral intake you need to go to the emergency room.  If symptoms not improving within a few days please return for reevaluation as we discussed.

## 2021-06-17 NOTE — ED Triage Notes (Signed)
Pt presents with a sore throat, cough and chest pain.   States the chest pain was severe last night  Pt reports drinking lots of fluids.  States she was vomiting.   Pt states she has been working with some people that have been sick.

## 2021-06-25 ENCOUNTER — Ambulatory Visit (HOSPITAL_COMMUNITY): Payer: BC Managed Care – PPO

## 2021-07-17 DIAGNOSIS — F902 Attention-deficit hyperactivity disorder, combined type: Secondary | ICD-10-CM | POA: Diagnosis not present

## 2021-08-08 ENCOUNTER — Ambulatory Visit: Payer: BC Managed Care – PPO | Admitting: Orthopaedic Surgery

## 2021-08-22 ENCOUNTER — Other Ambulatory Visit: Payer: Self-pay

## 2021-08-22 ENCOUNTER — Ambulatory Visit (INDEPENDENT_AMBULATORY_CARE_PROVIDER_SITE_OTHER): Payer: BC Managed Care – PPO | Admitting: Orthopaedic Surgery

## 2021-08-22 ENCOUNTER — Ambulatory Visit: Payer: Self-pay

## 2021-08-22 ENCOUNTER — Encounter: Payer: Self-pay | Admitting: Orthopaedic Surgery

## 2021-08-22 DIAGNOSIS — Z96641 Presence of right artificial hip joint: Secondary | ICD-10-CM

## 2021-08-22 NOTE — Progress Notes (Signed)
? ?Office Visit Note ?  ?Patient: [name] [name]           ?Date of Birth: August 11, 1980           ?MRN: [id number] ?Visit Date: 08/22/2021 ?             ?Requested by: [name] Agee, NP ?[phone] ?[place],  [place] [address] ?PCP: [name] Agee, NP ? ? ?Assessment & Plan: ?Visit Diagnoses:  ?1. Status post total replacement of right hip   ? ? ?Plan: [name] [name] is a 41 year old female who is 2 years status post right total hip replacement.  She is doing well overall.  She reports some soreness when she tries to jog.  She works as a Child psychotherapist at [employer].  No real complaints today. ? ?Examination right hip shows a fully healed surgical scar.  She has excellent gait and hip range of motion. ? ?Her x-rays demonstrate a stable right total hip replacement without any complications.  At this point we can release her to follow-up as needed. ? ?Follow-Up Instructions: Return if symptoms worsen or fail to improve.  ? ?Orders:  ?Orders Placed This Encounter  ?Procedures  ? XR Pelvis 1-2 Views  ? ?No orders of the defined types were placed in this encounter. ? ? ? ? Procedures: ?No procedures performed ? ? ?Clinical Data: ?No additional findings. ? ? ?Subjective: ?Chief Complaint  ?Patient presents with  ? Right Hip - Follow-up  ?  Right total hip arthroplasty 08/31/2019  ? ? ?HPI ? ?Review of Systems ? ? ?Objective: ?Vital Signs: There were no vitals taken for this visit. ? ?Physical Exam ? ?Ortho Exam ? ?Specialty Comments:  ?No specialty comments available. ? ?Imaging: ?XR Pelvis 1-2 Views ? ?Result Date: 08/22/2021 ?Stable total hip replacement without complications  ? ? ?PMFS History: ?Patient Active Problem List  ? Diagnosis Date Noted  ? 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 Status post total replacement of right hip 08/31/2019  ? Avascular necrosis of bone of right hip ([facility]) 08/05/2019  ? Elevated blood pressure reading in office without diagnosis of hypertension 08/05/2019   ? Attention deficit hyperactivity disorder (ADHD) 06/22/2015  ? ?Past Medical History:  ?Diagnosis Date  ? ADHD (attention deficit hyperactivity disorder)   ? Arthritis   ? Ectopic pregnancy   ? Hypertension   ?  ?Family History  ?Problem Relation Age of Onset  ? Hypertension Father   ? Heart disease Father   ? Healthy Mother   ?  ?Past Surgical History:  ?Procedure Laterality Date  ? TOTAL HIP ARTHROPLASTY Right 08/31/2019  ? Procedure: RIGHT TOTAL HIP ARTHROPLASTY ANTERIOR APPROACH;  Surgeon: Tarry Kos, MD;  Location: [facility];  Service: Orthopedics;  Laterality: Right;  ? WISDOM TOOTH EXTRACTION    ? ?Social History  ? ?Occupational History  ? Occupation: Office manager  ?  Comment: [employer]  ? Occupation: blogger  ?  Comment: [employer] blog for [id number]  ?Tobacco Use  ? Smoking status: Former  ?  Packs/day: 1.00  ?  Years: 23.00  ?  Pack years: 23.00  ?  Types: Cigarettes  ?  Quit date: 05/28/2017  ?  Years since quitting: 4.2  ? Smokeless tobacco: Never  ?Vaping Use  ? Vaping Use: Never used  ?Substance and Sexual Activity  ? Alcohol use: Yes  ?  Alcohol/week: 0.0 standard drinks  ?  Comment: 4 drinks per day  ?  Drug use: No  ? Sexual activity: Yes  ?  Partners: Male  ?  Birth control/protection: None  ?  Comment: trying to become pregnant  ? ? ? ? ? ? ?

## 2021-10-12 IMAGING — DX DG LUMBAR SPINE COMPLETE 4+V
1 series · 1 of 1 positions shown · non-contrast
Comparison: None.

CLINICAL DATA: Chronic right-sided low back pain with right
sciatica.

EXAM:
LUMBAR SPINE - COMPLETE 4+ VIEW

[l-spine l5-s1]
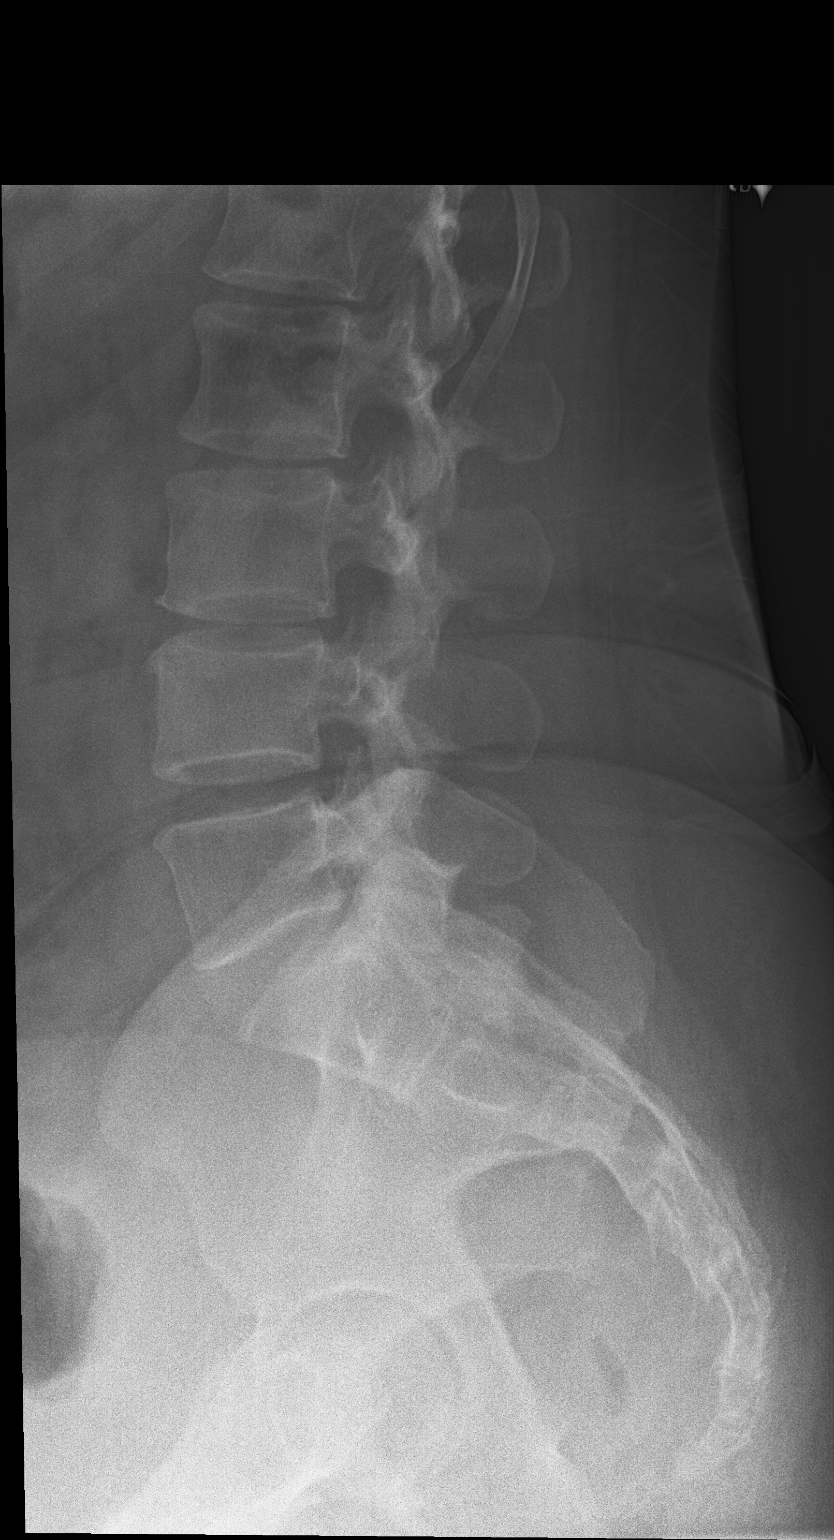

[1 of 1 positions shown; findings below may reference images not displayed]

FINDINGS: Lumbar spine appears normal. The disc spaces are preserved. No facet
arthritis. Lateral alignment is normal. 7 mm elongated calcification
to the right of the lumbar spine at L2-3 is demonstrated to be too
far anterior on the lateral view to be within the ureter.

Note is made of collapse of the right femoral head suggesting
avascular necrosis. This is incompletely visualized.
IMPRESSION: 1. Normal lumbar spine.
2. 7 mm calcification to the right of the lumbar spine at L2-3 not
felt to be within the ureter.
3. Avascular necrosis of the right femoral head.

## 2021-10-16 DIAGNOSIS — F902 Attention-deficit hyperactivity disorder, combined type: Secondary | ICD-10-CM | POA: Diagnosis not present

## 2021-10-24 ENCOUNTER — Emergency Department (HOSPITAL_COMMUNITY)
Admission: EM | Admit: 2021-10-24 | Discharge: 2021-10-24 | Disposition: A | Discharge status: Home / Self Care | Payer: BC Managed Care – PPO | Attending: Emergency Medicine | Admitting: Emergency Medicine

## 2021-10-24 ENCOUNTER — Other Ambulatory Visit: Payer: Self-pay

## 2021-10-24 ENCOUNTER — Encounter (HOSPITAL_COMMUNITY): Payer: Self-pay

## 2021-10-24 ENCOUNTER — Other Ambulatory Visit: Payer: Self-pay | Admitting: Registered Nurse

## 2021-10-24 DIAGNOSIS — Z79899 Other long term (current) drug therapy: Secondary | ICD-10-CM | POA: Insufficient documentation

## 2021-10-24 DIAGNOSIS — F10929 Alcohol use, unspecified with intoxication, unspecified: Secondary | ICD-10-CM | POA: Diagnosis not present

## 2021-10-24 DIAGNOSIS — I10 Essential (primary) hypertension: Secondary | ICD-10-CM | POA: Diagnosis not present

## 2021-10-24 DIAGNOSIS — R112 Nausea with vomiting, unspecified: Secondary | ICD-10-CM

## 2021-10-24 DIAGNOSIS — R092 Respiratory arrest: Secondary | ICD-10-CM | POA: Diagnosis not present

## 2021-10-24 DIAGNOSIS — F10120 Alcohol abuse with intoxication, uncomplicated: Secondary | ICD-10-CM | POA: Insufficient documentation

## 2021-10-24 DIAGNOSIS — R4020 Unspecified coma: Secondary | ICD-10-CM | POA: Diagnosis not present

## 2021-10-24 DIAGNOSIS — Y908 Blood alcohol level of 240 mg/100 ml or more: Secondary | ICD-10-CM | POA: Insufficient documentation

## 2021-10-24 DIAGNOSIS — R4781 Slurred speech: Secondary | ICD-10-CM | POA: Diagnosis not present

## 2021-10-24 DIAGNOSIS — R55 Syncope and collapse: Secondary | ICD-10-CM | POA: Diagnosis not present

## 2021-10-24 DIAGNOSIS — R404 Transient alteration of awareness: Secondary | ICD-10-CM | POA: Diagnosis not present

## 2021-10-24 DIAGNOSIS — T887XXA Unspecified adverse effect of drug or medicament, initial encounter: Secondary | ICD-10-CM | POA: Diagnosis not present

## 2021-10-24 DIAGNOSIS — F10920 Alcohol use, unspecified with intoxication, uncomplicated: Secondary | ICD-10-CM

## 2021-10-24 LAB — CBC WITH DIFFERENTIAL/PLATELET
Abs Immature Granulocytes: 0.02 10*3/uL (ref 0.00–0.07)
Basophils Absolute: 0.1 10*3/uL (ref 0.0–0.1)
Basophils Relative: 2 %
Eosinophils Absolute: 0.3 10*3/uL (ref 0.0–0.5)
Eosinophils Relative: 5 %
HCT: 40.2 % (ref 36.0–46.0)
Hemoglobin: 13 g/dL (ref 12.0–15.0)
Immature Granulocytes: 0 %
Lymphocytes Relative: 44 %
Lymphs Abs: 2.4 10*3/uL (ref 0.7–4.0)
MCH: 31.8 pg (ref 26.0–34.0)
MCHC: 32.3 g/dL (ref 30.0–36.0)
MCV: 98.3 fL (ref 80.0–100.0)
Monocytes Absolute: 0.4 10*3/uL (ref 0.1–1.0)
Monocytes Relative: 7 %
Neutro Abs: 2.3 10*3/uL (ref 1.7–7.7)
Neutrophils Relative %: 42 %
Platelets: 364 10*3/uL (ref 150–400)
RBC: 4.09 MIL/uL (ref 3.87–5.11)
RDW: 12.9 % (ref 11.5–15.5)
WBC: 5.4 10*3/uL (ref 4.0–10.5)
nRBC: 0 % (ref 0.0–0.2)

## 2021-10-24 LAB — COMPREHENSIVE METABOLIC PANEL
ALT: 19 U/L (ref 0–44)
AST: 20 U/L (ref 15–41)
Albumin: 4.1 g/dL (ref 3.5–5.0)
Alkaline Phosphatase: 51 U/L (ref 38–126)
Anion gap: 10 (ref 5–15)
BUN: 16 mg/dL (ref 6–20)
CO2: 21 mmol/L — ABNORMAL LOW (ref 22–32)
Calcium: 8.9 mg/dL (ref 8.9–10.3)
Chloride: 112 mmol/L — ABNORMAL HIGH (ref 98–111)
Creatinine, Ser: 0.66 mg/dL (ref 0.44–1.00)
GFR, Estimated: >60 mL/min (ref >60)
Glucose, Bld: 116 mg/dL — ABNORMAL HIGH (ref 70–99)
Potassium: 3.9 mmol/L (ref 3.5–5.1)
Sodium: 143 mmol/L (ref 135–145)
Total Bilirubin: 0.3 mg/dL (ref 0.3–1.2)
Total Protein: 7.2 g/dL (ref 6.5–8.1)

## 2021-10-24 LAB — ETHANOL: Alcohol, Ethyl (B): 305 mg/dL (ref <10)

## 2021-10-24 MED ORDER — ONDANSETRON 8 MG PO TBDP: 8.0000 mg | ORAL_TABLET | Freq: Three times a day (TID) | ORAL | 0 refills | Status: DC | PRN

## 2021-10-24 MED ORDER — ONDANSETRON HCL 4 MG/2ML IJ SOLN
4.0000 mg | Freq: Once | INTRAMUSCULAR | Status: AC
  Administered 2021-10-24: 4 mg via INTRAVENOUS
  Filled 2021-10-24: qty 2

## 2021-10-24 NOTE — Discharge Instructions (Addendum)
Do not use any drugs which are not prescribed to you by a medical provider. ?

## 2021-10-24 NOTE — ED Notes (Signed)
Discharge paperwork reviewed and given to pt. Pt. Showed verbal understanding of paperwork. Pt. Walked out of ED with significant other. ?

## 2021-10-24 NOTE — ED Triage Notes (Signed)
Pt. BIB [facility] from a bar where she was found unresponsive and not breathing. Pt. Was given 4mg  total of narcan by bystanders. Pt was bagged for 10 mins on scene by ems. Pt. Has a history of drug use but denies taking anything tonight. ? ?EMS VS:  ?BP: 148/70 ?HR: 100 ?RR: 14 ?O2: 95% 2L/ 90% RA ?

## 2021-10-24 NOTE — ED Provider Notes (Signed)
?[facility]-EMERGENCY DEPT ?Provider Note ? ? ?CSN: [id number] ?Arrival date & time: 10/24/21  0149 ? ?  ? ?History ? ?Chief Complaint  ?Patient presents with  ? Drug Overdose  ? ? ?[name] [name] is a 41 y.o. female. ? ?The history is provided by the patient.  ?Drug Overdose ?She has a history of attention deficit disorder, hypertension and was brought in by ambulance after an apparent overdose.  She was at a bar where she was found unresponsive and not breathing.  Bystanders reportedly gave a total of 4 mg of naloxone.  EMS reports bag valve ventilation for 10 minutes.  Patient states that she did take her Adderall tonight but denies any other drug use.  She does admit to drinking.  However, she does not have a clear memory of events at the bar. ?  ?Home Medications ?Prior to Admission medications   ?Medication Sig Start Date End Date Taking? Authorizing Provider  ?amoxicillin-clavulanate (AUGMENTIN) 875-125 MG tablet Take 1 tablet by mouth every 12 (twelve) hours. 06/17/21   Raspet, Noberto Retort, PA-C  ?amphetamine-dextroamphetamine (ADDERALL XR) 25 MG 24 hr capsule Take 25 mg by mouth every morning.    [provider]  ?amphetamine-dextroamphetamine (ADDERALL XR) 30 MG 24 hr capsule Take 30 mg by mouth daily in the afternoon.     [provider]  ?chlorthalidone (HYGROTON) 25 MG tablet TAKE 1 TABLET(25 MG) BY MOUTH DAILY 10/28/20   Janeece Agee, NP  ?cyclobenzaprine (FLEXERIL) 10 MG tablet TAKE 1 TABLET(10 MG) BY MOUTH THREE TIMES DAILY AS NEEDED FOR MUSCLE SPASMS 11/09/19   Janeece Agee, NP  ?hydrOXYzine (ATARAX/VISTARIL) 10 MG tablet Take 1 tablet (10 mg total) by mouth 3 (three) times daily as needed. 07/13/20   Janeece Agee, NP  ?meloxicam (MOBIC) 15 MG tablet TAKE 1 TABLET(15 MG) BY MOUTH DAILY 12/28/19   Janeece Agee, NP  ?methocarbamol (ROBAXIN) 500 MG tablet Take 1 tablet (500 mg total) by mouth 2 (two) times daily as needed. 08/26/19   Cristie Hem, PA-C   ?ondansetron (ZOFRAN) 4 MG tablet Take 1 tablet (4 mg total) by mouth every 8 (eight) hours as needed for nausea or vomiting. 07/13/20   Janeece Agee, NP  ?oxyCODONE (OXY IR/ROXICODONE) 5 MG immediate release tablet Take 1-2 tablets (5-10 mg total) by mouth every 8 (eight) hours as needed for severe pain. 02/09/20   Janeece Agee, NP  ?polyethylene glycol (MIRALAX) 17 g packet Take 17 g by mouth daily as needed. 08/26/19   Cristie Hem, PA-C  ?traMADol (ULTRAM) 50 MG tablet Take 1 tablet (50 mg total) by mouth every 12 (twelve) hours as needed. 07/13/20   Janeece Agee, NP  ?   ? ?Allergies    ?Toradol [ketorolac tromethamine]   ? ?Review of Systems   ?Review of Systems  ?All other systems reviewed and are negative. ? ?Physical Exam ?Updated Vital Signs ?BP (!) 149/105   Pulse 97   Temp 98.3 ?F (36.8 ?C) (Oral)   Resp 15   Ht 5\' 6"  (1.676 m)   Wt 90.7 kg   SpO2 93%   BMI 32.28 kg/m?  ?Physical Exam ?Vitals and nursing note reviewed.  ?41 year old female, resting comfortably and in no acute distress. Vital signs are significant for elevated blood pressure. Oxygen saturation is 93%, which is normal. ?Head is normocephalic and atraumatic. PERRLA, EOMI. Oropharynx is clear. ?Neck is nontender and supple without adenopathy or JVD. ?Back is nontender and there is no CVA  tenderness. ?Lungs are clear without rales, wheezes, or rhonchi. ?Chest is nontender. ?Heart has regular rate and rhythm without murmur. ?Abdomen is soft, flat, nontender. ?Extremities have no cyanosis or edema, full range of motion is present. ?Skin is warm and dry without rash. ?Neurologic: Awake and alert, speech slightly slurred consistent with alcohol intoxication, cranial nerves are intact, moves all extremities equally. ? ?ED Results / Procedures / Treatments   ?Labs ?(all labs ordered are listed, but only abnormal results are displayed) ?Labs Reviewed  ?COMPREHENSIVE METABOLIC PANEL - Abnormal; Notable for the following components:   ?    Result Value  ? Chloride 112 (*)   ? CO2 21 (*)   ? Glucose, Bld 116 (*)   ? All other components within normal limits  ?ETHANOL - Abnormal; Notable for the following components:  ? Alcohol, Ethyl (B) 305 (*)   ? All other components within normal limits  ?CBC WITH DIFFERENTIAL/PLATELET  ?RAPID URINE DRUG SCREEN, HOSP PERFORMED  ?POC URINE PREG, ED  ? ?Procedures ?Procedures  ?Cardiac monitor shows normal sinus rhythm, per my interpretation. ? ?Medications Ordered in ED ?Medications  ?ondansetron (ZOFRAN) injection 4 mg (4 mg Intravenous Given 10/24/21 0558)  ? ? ?ED Course/ Medical Decision Making/ A&P ?  ?                        ?Medical Decision Making ?Amount and/or Complexity of Data Reviewed ?Labs: ordered. ? ?Risk ?Prescription drug management. ? ? ?Apparent opioid overdose, accidental.  She will need to be observed in the emergency department for 4 hours to make sure that she does not have recurrence of oversedation as naloxone leaves her system.  Old records are reviewed, and she has no relevant past visits. ? ?Patient has not given a urine sample.  Ethanol level has come back markedly elevated at 306.  This is sufficient to explain all of her symptoms.  It is still possible she had an opioid overdose, but I cannot be sure of that.  Remainder of her labs are unremarkable.  She did develop nausea and vomiting, consistent with degree of alcohol intoxication.  She is given a dose of ondansetron and is discharged with prescription for ondansetron.  Of note, on reevaluation 4 hours later, patient is awake and alert and no longer slurring her words. ? ?Final Clinical Impression(s) / ED Diagnoses ?Final diagnoses:  ?Alcohol intoxication, uncomplicated ([facility])  ?Nausea and vomiting, unspecified vomiting type  ? ? ?Rx / DC Orders ?ED Discharge Orders   ? ?      Ordered  ?  ondansetron (ZOFRAN-ODT) 8 MG disintegrating tablet  Every 8 hours PRN       ? 10/24/21 0604  ? ?  ?  ? ?  ? ? ?  ?Dione Booze, MD ?10/24/21  [phone] ? ?

## 2021-11-07 IMAGING — RF DG HIP (WITH PELVIS) OPERATIVE*R*
1 series · 4 of 4 positions shown · non-contrast
Comparison: None.

CLINICAL DATA: Right total hip arthroplasty

EXAM:
OPERATIVE RIGHT HIP (WITH PELVIS IF PERFORMED) 4 VIEWS
TECHNIQUE: Fluoroscopic spot image(s) were submitted for interpretation
post-operatively.

[Series 1: unknown protocol · 0.20mm/px · 4 of 4 slices shown]
[im 1/4]
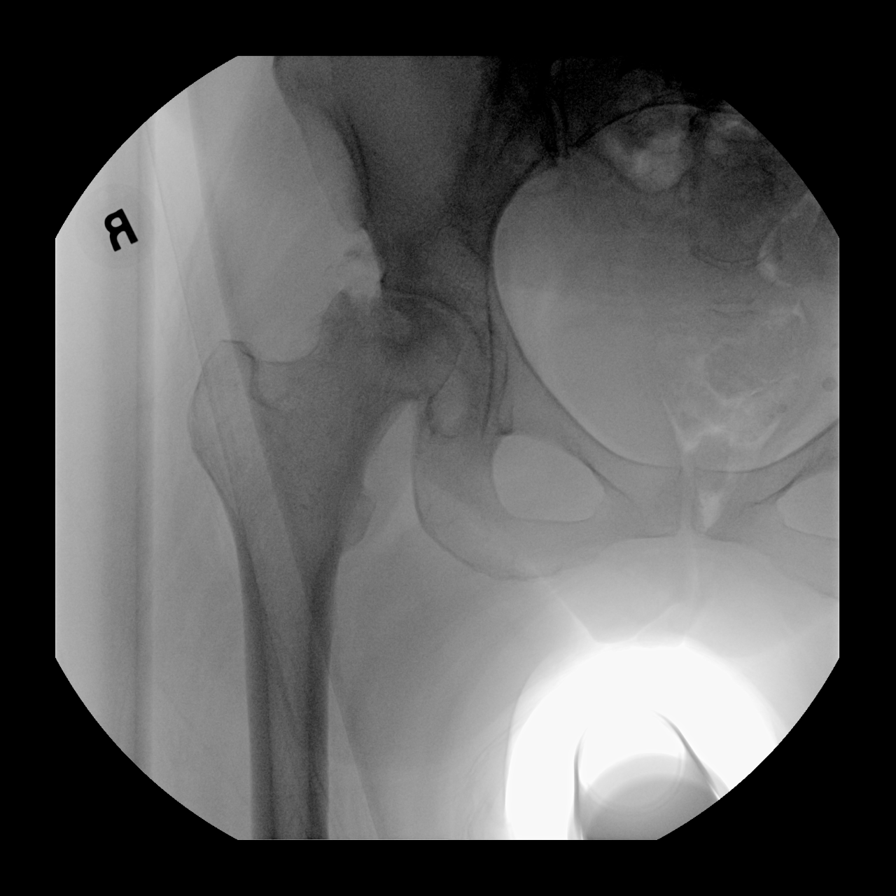
[im 2/4]
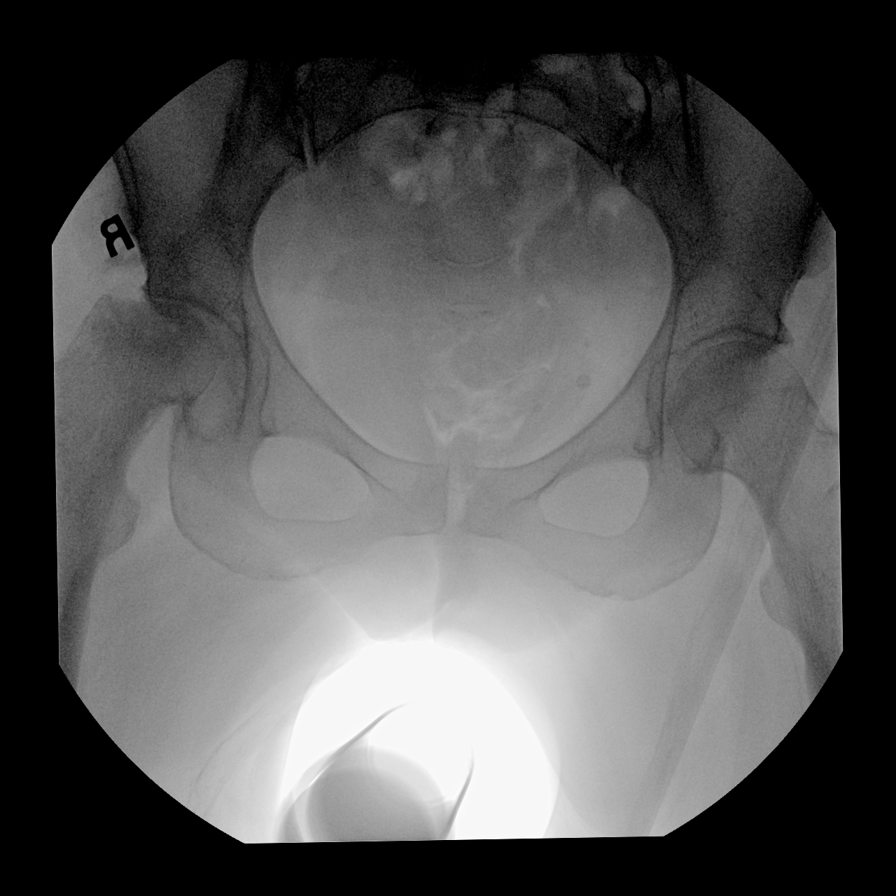
[im 3/4]
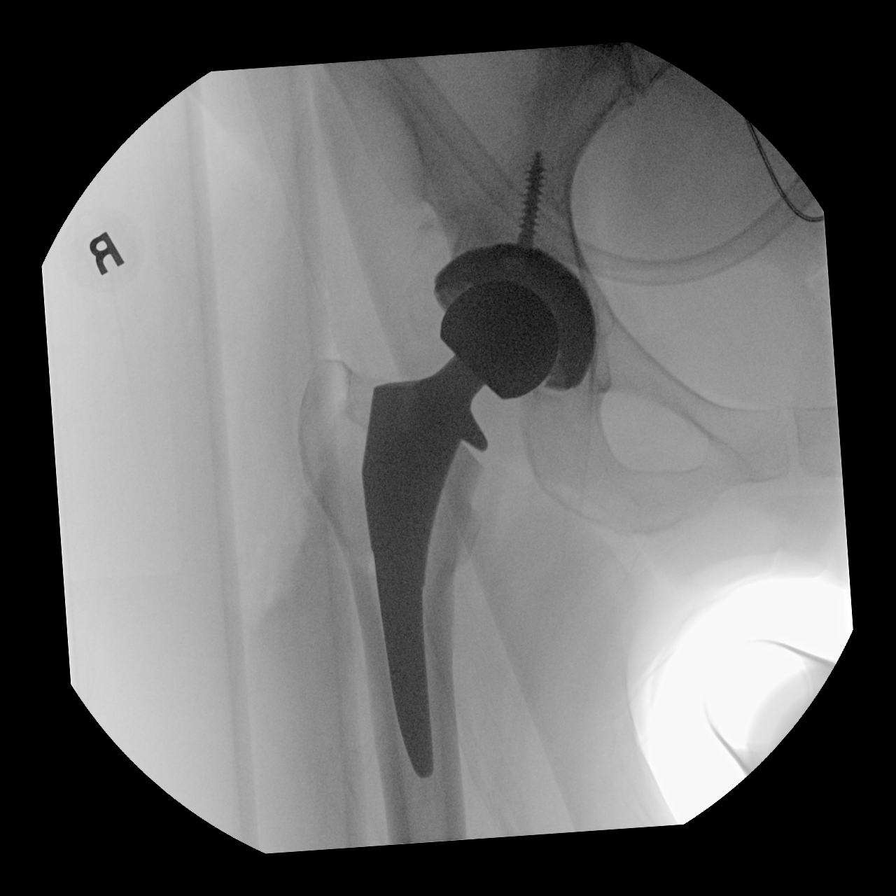
[im 4/4]
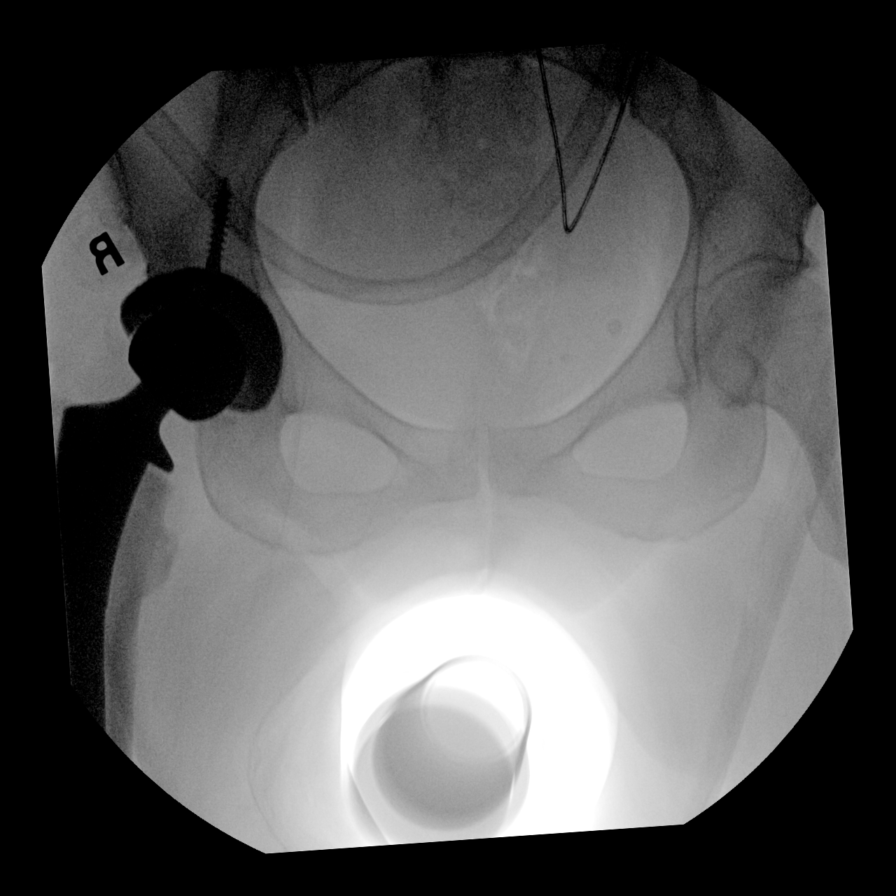

[4 of 4 positions shown; findings below may reference images not displayed]

FINDINGS: Multiple intraoperative fluoroscopic spot images are provided.
Interval right total hip arthroplasty. Normal alignment.

FLUOROSCOPY TIME:  15 seconds
IMPRESSION: Intraoperative localization.

## 2022-01-29 DIAGNOSIS — F902 Attention-deficit hyperactivity disorder, combined type: Secondary | ICD-10-CM | POA: Diagnosis not present

## 2022-02-07 DIAGNOSIS — Z13220 Encounter for screening for lipoid disorders: Secondary | ICD-10-CM | POA: Diagnosis not present

## 2022-02-07 DIAGNOSIS — F909 Attention-deficit hyperactivity disorder, unspecified type: Secondary | ICD-10-CM | POA: Diagnosis not present

## 2022-02-07 DIAGNOSIS — Z1329 Encounter for screening for other suspected endocrine disorder: Secondary | ICD-10-CM | POA: Diagnosis not present

## 2022-02-07 DIAGNOSIS — I10 Essential (primary) hypertension: Secondary | ICD-10-CM | POA: Diagnosis not present

## 2022-02-07 DIAGNOSIS — Z130 Encounter for screening for diseases of the blood and blood-forming organs and certain disorders involving the immune mechanism: Secondary | ICD-10-CM | POA: Diagnosis not present

## 2022-02-28 DIAGNOSIS — I10 Essential (primary) hypertension: Secondary | ICD-10-CM | POA: Diagnosis not present

## 2022-02-28 DIAGNOSIS — F321 Major depressive disorder, single episode, moderate: Secondary | ICD-10-CM | POA: Diagnosis not present

## 2022-02-28 DIAGNOSIS — F909 Attention-deficit hyperactivity disorder, unspecified type: Secondary | ICD-10-CM | POA: Diagnosis not present

## 2022-04-10 DIAGNOSIS — F321 Major depressive disorder, single episode, moderate: Secondary | ICD-10-CM | POA: Diagnosis not present

## 2022-04-10 DIAGNOSIS — F909 Attention-deficit hyperactivity disorder, unspecified type: Secondary | ICD-10-CM | POA: Diagnosis not present

## 2022-04-10 DIAGNOSIS — I10 Essential (primary) hypertension: Secondary | ICD-10-CM | POA: Diagnosis not present

## 2022-06-25 ENCOUNTER — Other Ambulatory Visit: Payer: Self-pay | Admitting: Family Medicine

## 2022-06-25 DIAGNOSIS — G8929 Other chronic pain: Secondary | ICD-10-CM

## 2022-11-26 ENCOUNTER — Other Ambulatory Visit: Payer: Self-pay

## 2022-11-26 ENCOUNTER — Encounter (HOSPITAL_COMMUNITY): Payer: Self-pay | Admitting: *Deleted

## 2022-11-26 ENCOUNTER — Ambulatory Visit (HOSPITAL_COMMUNITY)
Admission: EM | Admit: 2022-11-26 | Discharge: 2022-11-26 | Disposition: A | Discharge status: Home / Self Care | Payer: BC Managed Care – PPO | Attending: Emergency Medicine | Admitting: Emergency Medicine

## 2022-11-26 DIAGNOSIS — R1084 Generalized abdominal pain: Secondary | ICD-10-CM | POA: Diagnosis not present

## 2022-11-26 HISTORY — DX: Depression, unspecified: F32.A

## 2022-11-26 LAB — POCT URINALYSIS DIP (MANUAL ENTRY)
Glucose, UA: NEGATIVE mg/dL
Ketones, POC UA: NEGATIVE mg/dL
Leukocytes, UA: NEGATIVE
Nitrite, UA: NEGATIVE
Protein Ur, POC: 100 mg/dL — AB
Spec Grav, UA: 1.025 (ref 1.010–1.025)
Urobilinogen, UA: 0.2 E.U./dL
pH, UA: 7 (ref 5.0–8.0)

## 2022-11-26 LAB — POCT URINE PREGNANCY: Preg Test, Ur: NEGATIVE

## 2022-11-26 MED ORDER — SENNA 8.6 MG PO TABS: 1.0000 | ORAL_TABLET | Freq: Two times a day (BID) | ORAL | 0 refills | Status: AC

## 2022-11-26 NOTE — ED Provider Notes (Signed)
[facility]    CSN: [id number] Arrival date & time: 11/26/22  1617     History   Chief Complaint Chief Complaint  Patient presents with   Abdominal Pain    HPI [name] [name] is a 42 y.o. female.  2-3 day history of generalized abdominal pain Feeling bloated and constipated. A little improved today.  Originally had fever, tmax 101 and chills but this resolved One episode of emesis this morning. Took zofran. Not able to have a normal BM in several days. Just a little came out yesterday.  Tried suppository, dulcolax pills, and miralax 1 capful  She has increased her fluids intake   Feels her urine does not look right. No dysuria, hematuria, urgency/frequency, flank pain.  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   Status post total replacement of right hip 08/31/2019   Avascular necrosis of bone of right hip (HCC) 08/05/2019   Elevated blood pressure reading in office without diagnosis of hypertension 08/05/2019   Attention deficit hyperactivity disorder (ADHD) 06/22/2015    Past Surgical History:  Procedure Laterality Date   TOTAL HIP ARTHROPLASTY Right 08/31/2019   Procedure: RIGHT TOTAL HIP ARTHROPLASTY ANTERIOR APPROACH;  Surgeon: Tarry Kos, MD;  Location: [facility];  Service: Orthopedics;  Laterality: Right;   WISDOM TOOTH EXTRACTION      OB History     Gravida  1   Para      Term      Preterm      AB      Living         SAB      IAB      Ectopic      Multiple      Live Births               Home Medications    Prior to Admission medications   Medication Sig Start Date End Date Taking? Authorizing Provider   amLODipine-benazepril (LOTREL) 10-20 MG capsule Take 1 capsule by mouth daily.   Yes [provider]  buPROPion (WELLBUTRIN XL) 300 MG 24 hr tablet Take 300 mg by mouth daily.   Yes [provider]  metFORMIN (GLUMETZA) 500 MG (MOD) 24 hr tablet Take 1,000 mg by mouth daily with breakfast. Using for weight loss   Yes [provider]  methylphenidate 27 MG PO CR tablet Take 27 mg by mouth every morning.   Yes [provider]  ondansetron (ZOFRAN) 4 MG tablet Take 4 mg by mouth every 8 (eight) hours as needed for nausea or vomiting.   Yes [provider]  senna (SENOKOT) 8.6 MG TABS tablet Take 1 tablet (8.6 mg total) by mouth 2 (two) times daily for 3 days. 11/26/22 11/29/22 Yes Rim Thatch, Lurena Joiner, PA-C  topiramate (TOPAMAX) 25 MG tablet Take 50 mg by mouth at bedtime. For weight loss   Yes [provider]    Family History Family History  Problem Relation Age of Onset   Hypertension Father    Heart disease Father    Healthy Mother     Social History Social History   Tobacco Use  Smoking status: Former    Packs/day: 1.00    Years: 23.00    Additional pack years: 0.00    Total pack years: 23.00    Types: Cigarettes    Quit date: 05/28/2017    Years since quitting: 5.5   Smokeless tobacco: Never  Vaping Use   Vaping Use: Never used  Substance Use Topics   Alcohol use: Yes    Alcohol/week: 0.0 standard drinks of alcohol    Comment: 4 drinks per day   Drug use: No     Allergies   Toradol [ketorolac tromethamine]   Review of Systems Review of Systems  Gastrointestinal:  Positive for abdominal pain.   As per HPI  Physical Exam Triage Vital Signs ED Triage Vitals  Enc Vitals Group     BP 11/26/22 1725 131/87     Pulse Rate 11/26/22 1725 96     Resp 11/26/22 1725 16     Temp 11/26/22 1725 (!) 97.3 F (36.3 C)     Temp Source 11/26/22 1725 Axillary     SpO2 11/26/22 1725 96 %     Weight --      Height --       Head Circumference --      Peak Flow --      Pain Score 11/26/22 1727 3     Pain Loc --      Pain Edu? --      Excl. in GC? --    No data found.  Updated Vital Signs BP 131/87   Pulse 96   Temp (!) 97.3 F (36.3 C) (Axillary)   Resp 16   LMP 11/12/2022 (Approximate)   SpO2 96%    Physical Exam Vitals and nursing note reviewed.  Constitutional:      Appearance: Normal appearance.  HENT:     Mouth/Throat:     Mouth: Mucous membranes are moist.     Pharynx: Oropharynx is clear.  Eyes:     General: No scleral icterus.    Conjunctiva/sclera: Conjunctivae normal.  Cardiovascular:     Rate and Rhythm: Normal rate and regular rhythm.     Heart sounds: Normal heart sounds.  Pulmonary:     Effort: Pulmonary effort is normal.     Breath sounds: Normal breath sounds.  Abdominal:     General: Abdomen is protuberant. Bowel sounds are normal.     Palpations: Abdomen is soft.     Tenderness: There is generalized abdominal tenderness. There is no right CVA tenderness, left CVA tenderness, guarding or rebound. Negative signs include Murphy's sign, Rovsing's sign and McBurney's sign.     Comments: No point tenderness. No guarding   Musculoskeletal:        General: Normal range of motion.     Cervical back: Normal range of motion.  Skin:    General: Skin is warm and dry.     Coloration: Skin is not jaundiced or pale.     Findings: No rash.  Neurological:     Mental Status: She is alert and oriented to person, place, and time.     UC Treatments / Results  Labs (all labs ordered are listed, but only abnormal results are displayed) Labs Reviewed  POCT URINALYSIS DIP (MANUAL ENTRY) - Abnormal; Notable for the following components:      Result Value   Color, UA orange (*)    Bilirubin, UA moderate (*)    Blood, UA trace-intact (*)    Protein Ur, POC =100 (*)  All other components within normal limits  URINE CULTURE  POCT URINE PREGNANCY    EKG  Radiology No results  found.  Procedures Procedures (including critical care time)  Medications Ordered in UC Medications - No data to display  Initial Impression / Assessment and Plan / UC Course  I have reviewed the triage vital signs and the nursing notes.  Pertinent labs & imaging results that were available during my care of the patient were reviewed by me and considered in my medical decision making (see chart for details).  Afebrile, well appearing.  Urine with trace RBC. Will culture.  UPT negative Generalized abdominal discomfort. With inability to have normal BM and bloated sensation suspect constipation. Low concern for obstruction at this time but have advised of strict return precautions.  Will try bowel regimen with senna 8.6 mg BID x 2 days and miralax cleanse 3 doses tonight. Should have BM in 6-12 hours. If no improvement will reassess. With any acute change or worsening she will be seen in the ED. All questions answered. Patient agreeable to plan  Final Clinical Impressions(s) / UC Diagnoses   Final diagnoses:  Generalized abdominal pain     Discharge Instructions      Take the senna tablets twice daily for 2 days  Use the miralax powder, 3 doses.  (1 capful dissolved in 1 glass of water, repeated for 3 doses) You should have bowel movement in 6-12 hours  Please go to the emergency department if symptoms worsen.     ED Prescriptions     Medication Sig Dispense Auth. Provider   senna (SENOKOT) 8.6 MG TABS tablet Take 1 tablet (8.6 mg total) by mouth 2 (two) times daily for 3 days. 6 tablet Aviyon Hocevar, Lurena Joiner, PA-C      I have reviewed the PDMP during this encounter.   Kathrine Haddock 11/26/22 1901

## 2022-11-26 NOTE — Discharge Instructions (Addendum)
Take the senna tablets twice daily for 2 days  Use the miralax powder, 3 doses.  (1 capful dissolved in 1 glass of water, repeated for 3 doses) You should have bowel movement in 6-12 hours  Please go to the emergency department if symptoms worsen.

## 2022-11-26 NOTE — ED Triage Notes (Addendum)
C/O RUQ pain radiating throught to back, onset 3 days ago, 2 days ago started with fever 101.2 (states fever has since resolved). C/O night sweats. Pain has since become very generalized along with sensation of bloating, and now is having constipation. States she has taken laxative, but has not had large results. C/O poor appetite. States had one emesis this AM - took an antiemetic at home. Also had been having severe HA yesterday - states couldn't cough without feeling like her "head was going to explode", but now HA is completely resolved. States has been drinking a lot of water, "but my urine just doesn't look normal".

## 2022-11-27 ENCOUNTER — Telehealth (HOSPITAL_COMMUNITY): Payer: Self-pay | Admitting: Emergency Medicine

## 2022-11-27 LAB — URINE CULTURE: Culture: <10000 — AB

## 2022-11-27 NOTE — Telephone Encounter (Signed)
Patient left a voicemail stating she is not any better and is concerned.  Reviewed provider note, she recommended ED with continued symptoms Reviewed with patient, she did express frustration that we did not "transfer" her to the ED yesterday and our providers order her CT and/or Ultrasound, and as I tried to explain that we don't have that jurisdiction in the ED she interrupted me and asked if we ruled out this being her gallbladder, and I explained the ED could do that.  She ended the call.

## 2023-08-25 IMAGING — DX DG CHEST 2V
2 series · 2 of 2 positions shown · non-contrast
Comparison: 08/27/2019

CLINICAL DATA: Cough for 1 week.  History of smoking.

EXAM:
CHEST - 2 VIEW

[chest pa]
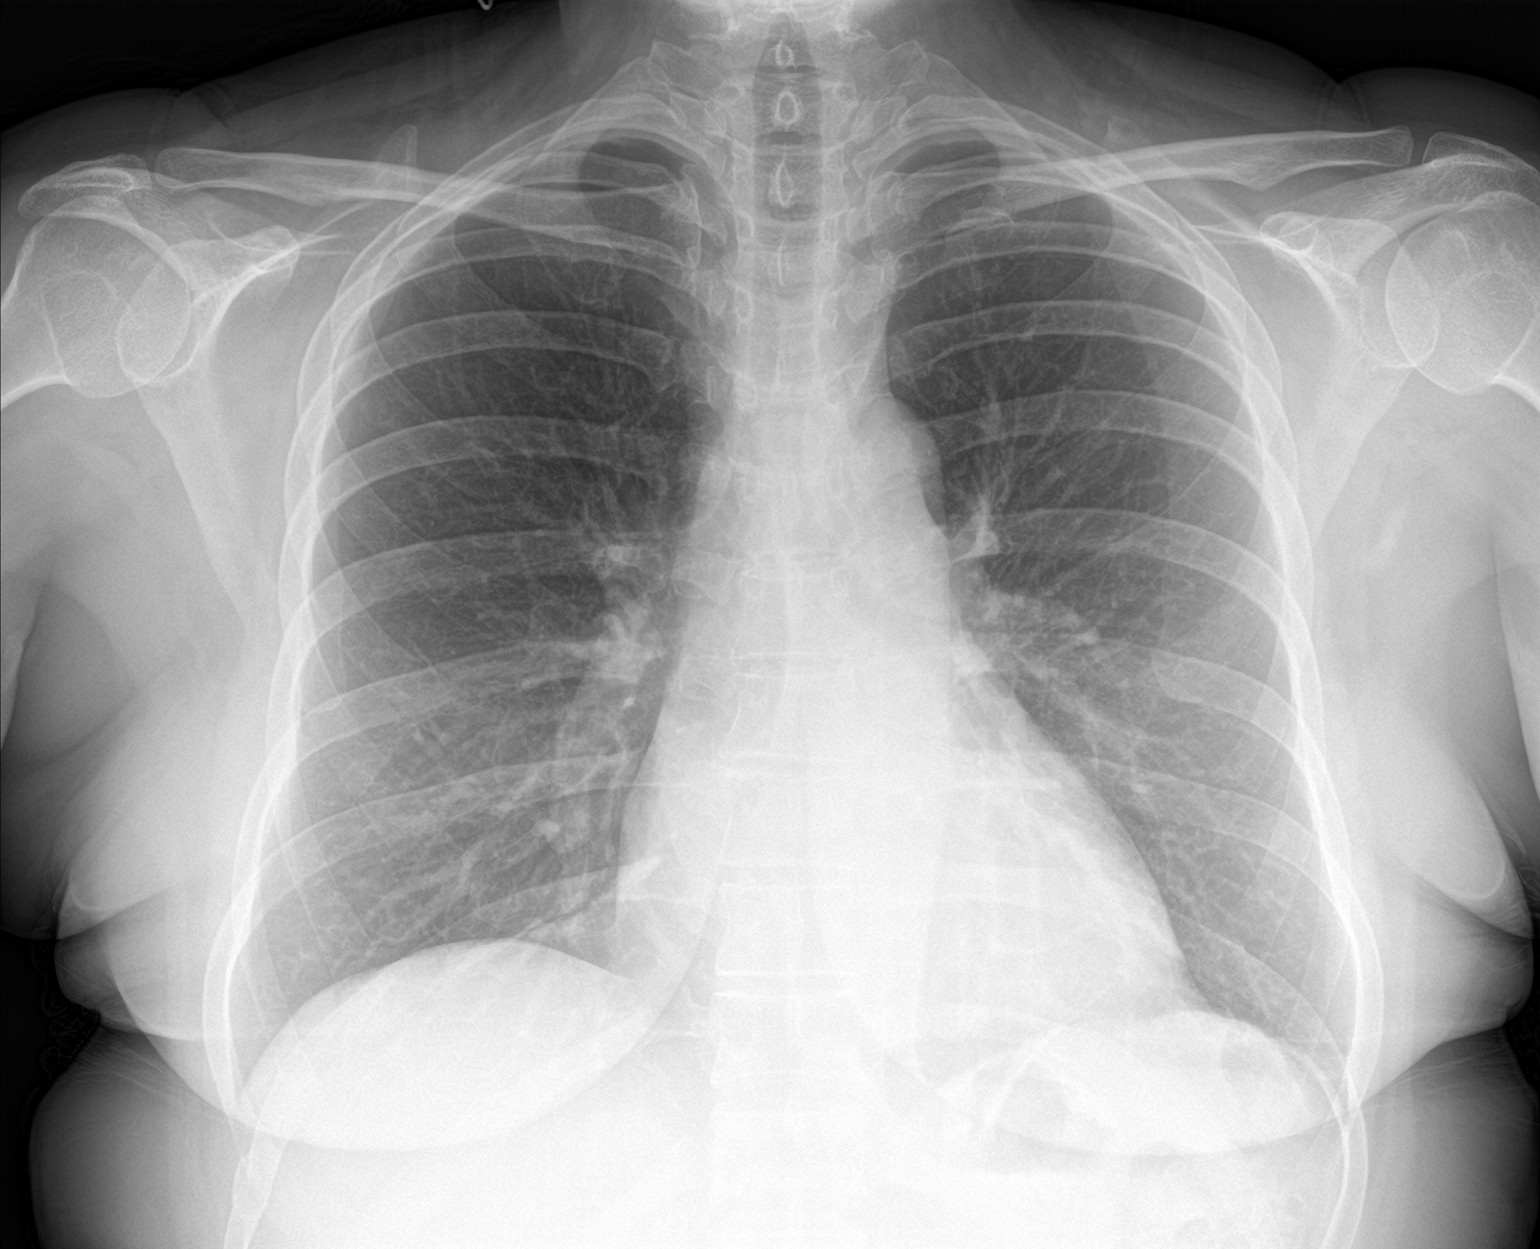

[chest lat]
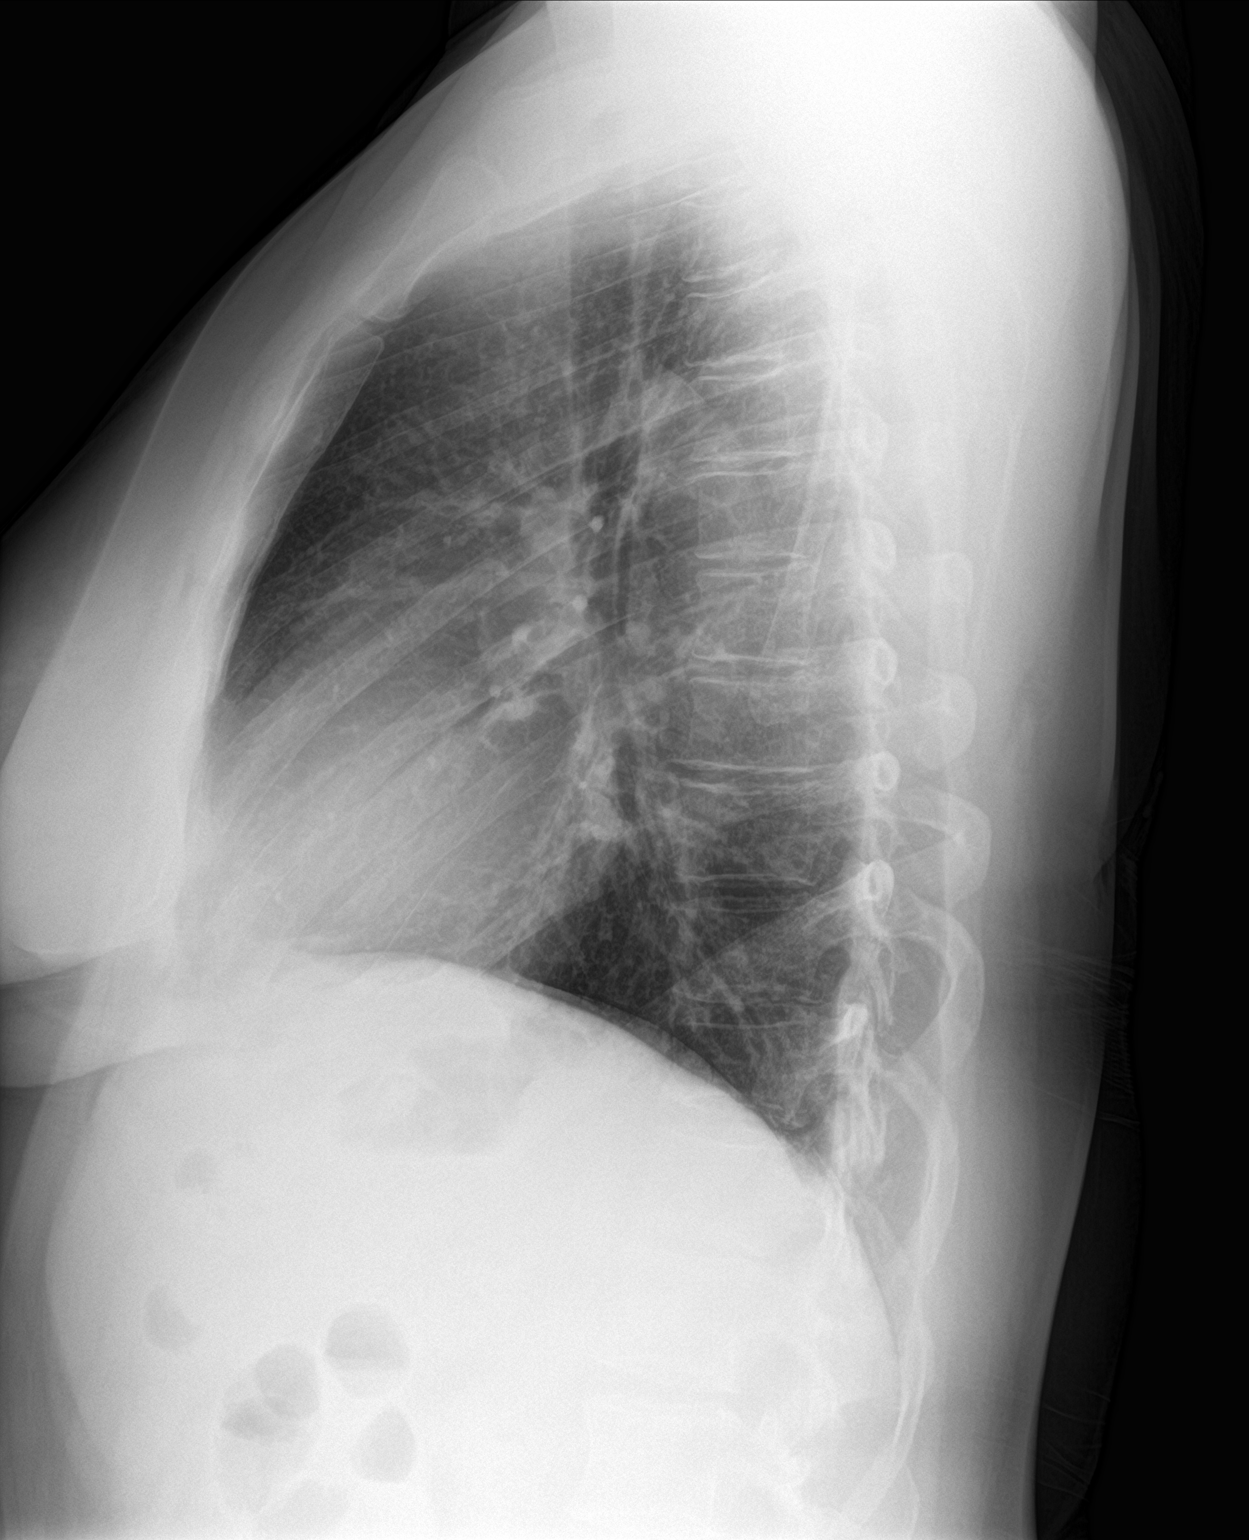

[2 of 2 positions shown; findings below may reference images not displayed]

FINDINGS: The cardiomediastinal silhouette is within normal limits. The lungs
are well inflated and clear. There is no evidence of pleural
effusion or pneumothorax. No acute osseous abnormality is
identified.
IMPRESSION: No active cardiopulmonary disease.

## 2023-09-04 ENCOUNTER — Other Ambulatory Visit (INDEPENDENT_AMBULATORY_CARE_PROVIDER_SITE_OTHER)

## 2023-09-04 ENCOUNTER — Ambulatory Visit: Admitting: Orthopaedic Surgery

## 2023-09-04 DIAGNOSIS — M25562 Pain in left knee: Secondary | ICD-10-CM

## 2023-09-04 DIAGNOSIS — M25552 Pain in left hip: Secondary | ICD-10-CM | POA: Diagnosis not present

## 2023-09-04 MED ORDER — LIDOCAINE HCL 1 % IJ SOLN
2.0000 mL | INTRAMUSCULAR | Status: AC | PRN
  Administered 2023-09-04: 2 mL

## 2023-09-04 MED ORDER — METHYLPREDNISOLONE ACETATE 40 MG/ML IJ SUSP
40.0000 mg | INTRAMUSCULAR | Status: AC | PRN
  Administered 2023-09-04: 40 mg via INTRA_ARTICULAR

## 2023-09-04 MED ORDER — BUPIVACAINE HCL 0.5 % IJ SOLN
2.0000 mL | INTRAMUSCULAR | Status: AC | PRN
  Administered 2023-09-04: 2 mL via INTRA_ARTICULAR

## 2023-09-04 NOTE — Progress Notes (Signed)
 Office Visit Note   Patient: [name] [name]           Date of Birth: 1981/03/04           MRN: [id number] Visit Date: 09/04/2023              Requested by: Janeece Agee, NP [address] [place],  [place] [address] PCP: Janeece Agee, NP   Assessment & Plan: Visit Diagnoses:  1. Pain in left hip   2. Acute pain of left knee     Plan: Patient is a very pleasant 43 year old female with left hip pain and left knee pain.  For the left hip I am concerned that she is developing avascular necrosis as I do see a noticeable change on her x-rays today.  Will get an MRI to better assess for avascular necrosis.  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  Aspiration and cortisone injection performed today.  Follow-Up Instructions: No follow-ups on file.   Orders:  Orders Placed This Encounter  Procedures   XR HIP UNILAT W OR W/O PELVIS 2-3 VIEWS LEFT   XR KNEE 3 VIEW LEFT   MR Hip Left w/o contrast   No orders of the defined types were placed in this encounter.     Procedures: Large Joint Inj: L knee on 09/04/2023 6:58 PM Details: 22 G needle Medications: 2 mL bupivacaine 0.5 %; 2 mL lidocaine 1 %; 40 mg methylPREDNISolone acetate 40 MG/ML Outcome: tolerated well, no immediate complications Patient was prepped and draped in the usual sterile fashion.       Clinical Data: No additional findings.   Subjective: Chief Complaint  Patient presents with   Left Hip - Pain   Left Knee - Pain    HPI Patient is a 43 year old female established patient of mine who comes in for evaluation of left hip and knee pain.  She is status post right total hip arthroplasty in 2021.  She has done well from this.  She works at [employer].  She reports left knee and groin pain that is worse with bending and standing while working.  She has been using a heating pad. Review of Systems  Constitutional: Negative.   HENT: Negative.    Eyes: Negative.    Respiratory: Negative.    Cardiovascular: Negative.   Endocrine: Negative.   Musculoskeletal: Negative.   Neurological: Negative.   Hematological: Negative.   Psychiatric/Behavioral: Negative.    All other systems reviewed and are negative.    Objective: Vital Signs: There were no vitals taken for this visit.  Physical Exam Vitals and nursing note reviewed.  Constitutional:      Appearance: She is well-developed.  HENT:     Head: Normocephalic and atraumatic.  Pulmonary:     Effort: Pulmonary effort is normal.  Abdominal:     Palpations: Abdomen is soft.  Musculoskeletal:     Cervical back: Neck supple.  Skin:    General: Skin is warm.     Capillary Refill: Capillary refill takes less than 2 seconds.  Neurological:     Mental Status: She is alert and oriented to person, place, and time.  Psychiatric:        Behavior: Behavior normal.        Thought Content: Thought content normal.        Judgment: Judgment normal.     Ortho Exam Exam of the left knee shows moderate joint effusion.  Normal range  of motion for the most part.  Collaterals and cruciates are stable.  Exam of the left hip shows pain with internal/external rotation and hip flexion.  No trochanteric tenderness. Specialty Comments:  No specialty comments available.  Imaging: XR HIP UNILAT W OR W/O PELVIS 2-3 VIEWS LEFT Result Date: 09/04/2023 X-rays of the pelvis show subtle flattening of the superior portion of the femoral head concerning for avascular necrosis.  There is a noticeable change from the prior x-ray from 2 years ago.  XR KNEE 3 VIEW LEFT Result Date: 09/04/2023 X-rays left knee show age advanced degenerative changes with periarticular spurring.    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   Status post total replacement of right hip 08/31/2019   Avascular necrosis of bone of right hip (HCC) 08/05/2019    Elevated blood pressure reading in office without diagnosis of hypertension 08/05/2019   Attention deficit hyperactivity disorder (ADHD) 06/22/2015   Past Medical History:  Diagnosis Date   ADHD (attention deficit hyperactivity disorder)    Arthritis    Depression    Ectopic pregnancy    Hypertension     Family History  Problem Relation Age of Onset   Hypertension Father    Heart disease Father    Healthy Mother     Past Surgical History:  Procedure Laterality Date   TOTAL HIP ARTHROPLASTY Right 08/31/2019   Procedure: RIGHT TOTAL HIP ARTHROPLASTY ANTERIOR APPROACH;  Surgeon: Tarry Kos, MD;  Location: [facility];  Service: Orthopedics;  Laterality: Right;   WISDOM TOOTH EXTRACTION     Social History   Occupational History   Occupation: Office manager    Comment: [employer]   Occupation: blogger    Comment: beer blog for [employer]  Tobacco Use   Smoking status: Former    Current packs/day: 0.00    Average packs/day: 1 pack/day for 23.0 years (23.0 ttl pk-yrs)    Types: Cigarettes    Start date: 05/28/1994    Quit date: 05/28/2017    Years since quitting: 6.2   Smokeless tobacco: Never  Vaping Use   Vaping status: Never Used  Substance and Sexual Activity   Alcohol use: Yes    Alcohol/week: 0.0 standard drinks of alcohol    Comment: 4 drinks per day   Drug use: No   Sexual activity: Yes    Partners: Male    Birth control/protection: None

## 2023-09-10 ENCOUNTER — Ambulatory Visit: Admitting: Orthopaedic Surgery

## 2023-09-11 ENCOUNTER — Encounter: Payer: Self-pay | Admitting: Orthopaedic Surgery

## 2023-09-15 ENCOUNTER — Ambulatory Visit
Admission: RE | Admit: 2023-09-15 | Discharge: 2023-09-15 | Disposition: A | Discharge status: Home / Self Care | Source: Ambulatory Visit | Attending: Orthopaedic Surgery | Admitting: Orthopaedic Surgery

## 2023-09-15 DIAGNOSIS — M25552 Pain in left hip: Secondary | ICD-10-CM

## 2023-09-16 ENCOUNTER — Other Ambulatory Visit: Payer: Self-pay | Admitting: Orthopaedic Surgery

## 2023-09-16 ENCOUNTER — Encounter: Payer: Self-pay | Admitting: Orthopaedic Surgery

## 2023-09-16 MED ORDER — ACETAMINOPHEN-CODEINE 300-30 MG PO TABS: 1.0000 | ORAL_TABLET | Freq: Every day | ORAL | 0 refills | Status: DC | PRN

## 2023-09-22 ENCOUNTER — Other Ambulatory Visit: Payer: Self-pay | Admitting: Orthopaedic Surgery

## 2023-09-23 MED ORDER — ACETAMINOPHEN-CODEINE 300-30 MG PO TABS: 1.0000 | ORAL_TABLET | Freq: Every day | ORAL | 0 refills | Status: DC | PRN

## 2023-09-26 ENCOUNTER — Other Ambulatory Visit: Payer: Self-pay | Admitting: Orthopaedic Surgery

## 2023-09-26 MED ORDER — ACETAMINOPHEN-CODEINE 300-30 MG PO TABS: 2.0000 | ORAL_TABLET | Freq: Every day | ORAL | 0 refills | Status: AC | PRN

## 2023-10-03 ENCOUNTER — Encounter: Payer: Self-pay | Admitting: Orthopaedic Surgery

## 2023-10-04 NOTE — Progress Notes (Signed)
Needs f/u appt.  Thanks.

## 2023-10-07 ENCOUNTER — Encounter: Payer: Self-pay | Admitting: Orthopaedic Surgery

## 2023-10-10 ENCOUNTER — Other Ambulatory Visit: Payer: Self-pay | Admitting: Physician Assistant

## 2023-10-10 MED ORDER — ACETAMINOPHEN-CODEINE 300-30 MG PO TABS: 1.0000 | ORAL_TABLET | Freq: Three times a day (TID) | ORAL | 0 refills | Status: DC | PRN

## 2023-10-10 NOTE — Telephone Encounter (Signed)
 sent

## 2023-10-10 NOTE — Telephone Encounter (Signed)
See message.  Thanks.

## 2023-10-11 ENCOUNTER — Ambulatory Visit: Admitting: Orthopaedic Surgery

## 2023-10-11 ENCOUNTER — Encounter: Payer: Self-pay | Admitting: Orthopaedic Surgery

## 2023-10-11 DIAGNOSIS — M87052 Idiopathic aseptic necrosis of left femur: Secondary | ICD-10-CM

## 2023-10-11 NOTE — Progress Notes (Signed)
 Office Visit Note   Patient: [name] [name]           Date of Birth: 1980/08/11           MRN: [id number] Visit Date: 10/11/2023              Requested by: Ulyess Gammons, NP [address] [place],  [place] [address] PCP: Ulyess Gammons, NP   Assessment & Plan: Visit Diagnoses:  1. Avascular necrosis of bone of hip, left (HCC)     Plan: Assessment and Plan    Avascular necrosis of hip Avascular necrosis with severe pain and worsening condition. MRI shows significant edema and severe bone bruise. - Discuss hip replacement surgery scheduling with surgery scheduler. - Consider same-day discharge post-surgery with extended recovery room stay and physical therapy assessment.  Severe bone bruise of hip Severe bone bruise likely secondary to avascular necrosis with intense edema on MRI.  Hip replacement Previous hip replacement in March 2021 by the same surgeon. She is familiar with the procedure and recovery process. - Coordinate with surgery scheduler to determine availability for hip replacement post-travel.      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  Imaging shows AVN and intense bone marrow edema.  At this point, conservative treatments fail to provide any significant relief and the pain is severely affecting ADLs and quality of life.  Based on treatment options, the patient has elected to move forward with a hip replacement.  We have discussed the surgical risks that include but are not limited to infection, DVT, leg length discrepancy, numbness, tingling, incomplete relief of pain.  Recovery and prognosis were also reviewed.    Current anticoagulants: No antithrombotic Postop anticoagulation: Aspirin  81 mg Diabetic: No  Prior DVT/PE: No Tobacco use: No Clearances needed for surgery: None Anticipate discharge dispo: outpatient  Follow-Up Instructions: No follow-ups on file.   Orders:  No orders of the defined types  were placed in this encounter.  No orders of the defined types were placed in this encounter.     Procedures: No procedures performed   Clinical Data: No additional findings.   Subjective: Chief Complaint  Patient presents with   Left Hip - Pain    HPI Discussed the use of AI scribe software for clinical note transcription with the patient, who gave verbal consent to proceed.  History of Present Illness   The patient, with a history of avascular necrosis and a previous hip replacement in March 2021, presents with worsening hip pain. She describes the pain as having significantly increased since her recent MRI, which showed a stress reaction and intense bone bruise. The patient reports that the pain has become so severe that it is affecting her ability to work. She is scheduled to travel to [place] from the 30th of the current month to the 8th of the next month for a family event, after which she is ready to proceed with another hip replacement.      Review of Systems  Constitutional: Negative.   HENT: Negative.    Eyes: Negative.   Respiratory: Negative.    Cardiovascular: Negative.   Endocrine: Negative.   Musculoskeletal: Negative.   Neurological: Negative.   Hematological: Negative.   Psychiatric/Behavioral: Negative.    All other systems reviewed and are negative.    Objective: Vital Signs: There were no vitals taken for this visit.  Physical Exam Vitals and nursing note reviewed.  Constitutional:      Appearance:  She is well-developed.  HENT:     Head: Normocephalic and atraumatic.  Pulmonary:     Effort: Pulmonary effort is normal.  Abdominal:     Palpations: Abdomen is soft.  Musculoskeletal:     Cervical back: Neck supple.  Skin:    General: Skin is warm.     Capillary Refill: Capillary refill takes less than 2 seconds.  Neurological:     Mental Status: She is alert and oriented to person, place, and time.  Psychiatric:        Behavior: Behavior  normal.        Thought Content: Thought content normal.        Judgment: Judgment normal.     Ortho Exam Examination of the left hip is unchanged from prior visit. Specialty Comments:  No specialty comments available.  Imaging: No results found.   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   Status post total replacement of right hip 08/31/2019   Avascular necrosis of bone of right hip (HCC) 08/05/2019   Elevated blood pressure reading in office without diagnosis of hypertension 08/05/2019   Attention deficit hyperactivity disorder (ADHD) 06/22/2015   Past Medical History:  Diagnosis Date   ADHD (attention deficit hyperactivity disorder)    Arthritis    Depression    Ectopic pregnancy    Hypertension     Family History  Problem Relation Age of Onset   Hypertension Father    Heart disease Father    Healthy Mother     Past Surgical History:  Procedure Laterality Date   TOTAL HIP ARTHROPLASTY Right 08/31/2019   Procedure: RIGHT TOTAL HIP ARTHROPLASTY ANTERIOR APPROACH;  Surgeon: Wes Hamman, MD;  Location: [facility];  Service: Orthopedics;  Laterality: Right;   WISDOM TOOTH EXTRACTION     Social History   Occupational History   Occupation: Office manager    Comment: [employer]   Occupation: blogger    Comment: beer blog for [employer]  Tobacco Use   Smoking status: Former    Current packs/day: 0.00    Average packs/day: 1 pack/day for 23.0 years (23.0 ttl pk-yrs)    Types: Cigarettes    Start date: 05/28/1994    Quit date: 05/28/2017    Years since quitting: 6.3   Smokeless tobacco: Never  Vaping Use   Vaping status: Never Used  Substance and Sexual Activity   Alcohol use: Yes    Alcohol/week: 0.0 standard drinks of alcohol    Comment: 4 drinks per day   Drug use: No   Sexual activity: Yes    Partners: Male    Birth control/protection: None

## 2023-10-23 ENCOUNTER — Encounter: Payer: Self-pay | Admitting: Orthopaedic Surgery

## 2023-10-23 ENCOUNTER — Other Ambulatory Visit: Payer: Self-pay | Admitting: Physician Assistant

## 2023-10-23 MED ORDER — ONDANSETRON HCL 4 MG PO TABS: 4.0000 mg | ORAL_TABLET | Freq: Three times a day (TID) | ORAL | 0 refills | Status: DC | PRN

## 2023-10-23 MED ORDER — DOCUSATE SODIUM 100 MG PO CAPS: 100.0000 mg | ORAL_CAPSULE | Freq: Every day | ORAL | 2 refills | Status: DC | PRN

## 2023-10-23 MED ORDER — ASPIRIN 81 MG PO CHEW: 81.0000 mg | CHEWABLE_TABLET | Freq: Two times a day (BID) | ORAL | 0 refills | Status: DC

## 2023-10-23 MED ORDER — METHOCARBAMOL 750 MG PO TABS
750.0000 mg | ORAL_TABLET | Freq: Three times a day (TID) | ORAL | 2 refills | Status: DC | PRN
Start: 2023-10-23 — End: 2023-11-27

## 2023-10-23 MED ORDER — OXYCODONE-ACETAMINOPHEN 5-325 MG PO TABS: 1.0000 | ORAL_TABLET | Freq: Four times a day (QID) | ORAL | 0 refills | Status: DC | PRN

## 2023-10-24 ENCOUNTER — Other Ambulatory Visit: Payer: Self-pay | Admitting: Physician Assistant

## 2023-10-24 MED ORDER — ACETAMINOPHEN-CODEINE 300-30 MG PO TABS
1.0000 | ORAL_TABLET | Freq: Three times a day (TID) | ORAL | 0 refills | Status: DC | PRN
Start: 2023-10-24 — End: 2023-11-04

## 2023-10-24 NOTE — Telephone Encounter (Signed)
 sent

## 2023-10-25 NOTE — Pre-Procedure Instructions (Signed)
 Surgical Instructions   Your procedure is scheduled on Nov 04, 2023. Report to [facility] Main Entrance "A" at 6:15 A.M., then check in with the Admitting office. Any questions or running late day of surgery: call [phone]  Questions prior to your surgery date: call [phone], Monday-Friday, 8am-4pm. If you experience any cold or flu symptoms such as cough, fever, chills, shortness of breath, etc. between now and your scheduled surgery, please notify us  at the above number.     Remember:  Do not eat after midnight the night before your surgery  You may drink clear liquids until 5:45 AM the morning of your surgery.   Clear liquids allowed are: Water, Non-Citrus Juices (without pulp), Carbonated Beverages, Clear Tea (no milk, honey, etc.), Black Coffee Only (NO MILK, CREAM OR POWDERED CREAMER of any kind), and Gatorade.  Patient Instructions  The night before surgery:  No food after midnight. ONLY clear liquids after midnight  The day of surgery (if you do NOT have diabetes):  Drink ONE (1) Pre-Surgery Clear Ensure by 5:45 AM the morning of surgery. Drink in one sitting. Do not sip.  This drink was given to you during your hospital  pre-op appointment visit.  Nothing else to drink after completing the  Pre-Surgery Clear Ensure.         If you have questions, please contact your surgeon's office.    Take these medicines the morning of surgery with A SIP OF WATER: buPROPion (WELLBUTRIN XL)    May take these medicines IF NEEDED: acetaminophen -codeine  (TYLENOL  #3)    DO NOT take your metFORMIN (GLUMETZA) the morning of surgery.   One week prior to surgery, STOP taking any Aspirin  (unless otherwise instructed by your surgeon) Aleve , Naproxen , Ibuprofen, Motrin, Advil, Goody's, BC's, all herbal medications, fish oil, and non-prescription vitamins.                     Do NOT Smoke (Tobacco/Vaping) for 24 hours prior to your procedure.  If you use a CPAP at night, you  may bring your mask/headgear for your overnight stay.   You will be asked to remove any contacts, glasses, piercing's, hearing aid's, dentures/partials prior to surgery. Please bring cases for these items if needed.    Patients discharged the day of surgery will not be allowed to drive home, and someone needs to stay with them for 24 hours.  SURGICAL WAITING ROOM VISITATION Patients may have no more than 2 support people in the waiting area - these visitors may rotate.   Pre-op nurse will coordinate an appropriate time for 1 ADULT support person, who may not rotate, to accompany patient in pre-op.  Children under the age of 18 must have an adult with them who is not the patient and must remain in the main waiting area with an adult.  If the patient needs to stay at the hospital during part of their recovery, the visitor guidelines for inpatient rooms apply.  Please refer to the [facility] website for the visitor guidelines for any additional information.   If you received a COVID test during your pre-op visit  it is requested that you wear a mask when out in public, stay away from anyone that may not be feeling well and notify your surgeon if you develop symptoms. If you have been in contact with anyone that has tested positive in the last 10 days please notify you surgeon.      Pre-operative 5 CHG Bathing Instructions  You can play a key role in reducing the risk of infection after surgery. Your skin needs to be as free of germs as possible. You can reduce the number of germs on your skin by washing with CHG (chlorhexidine  gluconate) soap before surgery. CHG is an antiseptic soap that kills germs and continues to kill germs even after washing.   DO NOT use if you have an allergy to chlorhexidine /CHG or antibacterial soaps. If your skin becomes reddened or irritated, stop using the CHG and notify one of our RNs at [phone].   Please shower with the CHG soap starting 4 days before  surgery using the following schedule:     Please keep in mind the following:  DO NOT shave, including legs and underarms, starting the day of your first shower.   You may shave your face at any point before/day of surgery.  Place clean sheets on your bed the day you start using CHG soap. Use a clean washcloth (not used since being washed) for each shower. DO NOT sleep with pets once you start using the CHG.   CHG Shower Instructions:  Wash your face and private area with normal soap. If you choose to wash your hair, wash first with your normal shampoo.  After you use shampoo/soap, rinse your hair and body thoroughly to remove shampoo/soap residue.  Turn the water OFF and apply about 3 tablespoons (45 ml) of CHG soap to a CLEAN washcloth.  Apply CHG soap ONLY FROM YOUR NECK DOWN TO YOUR TOES (washing for 3-5 minutes)  DO NOT use CHG soap on face, private areas, open wounds, or sores.  Pay special attention to the area where your surgery is being performed.  If you are having back surgery, having someone wash your back for you may be helpful. Wait 2 minutes after CHG soap is applied, then you may rinse off the CHG soap.  Pat dry with a clean towel  Put on clean clothes/pajamas   If you choose to wear lotion, please use ONLY the CHG-compatible lotions that are listed below.  Additional instructions for the day of surgery: DO NOT APPLY any lotions, deodorants, cologne, or perfumes.   Do not bring valuables to the hospital. [facility] is not responsible for any belongings/valuables. Do not wear nail polish, gel polish, artificial nails, or any other type of covering on natural nails (fingers and toes) Do not wear jewelry or makeup Put on clean/comfortable clothes.  Please brush your teeth.  Ask your nurse before applying any prescription medications to the skin.     CHG Compatible Lotions   Aveeno Moisturizing lotion  Cetaphil Moisturizing Cream  Cetaphil Moisturizing Lotion   Clairol Herbal Essence Moisturizing Lotion, Dry Skin  Clairol Herbal Essence Moisturizing Lotion, Extra Dry Skin  Clairol Herbal Essence Moisturizing Lotion, Normal Skin  Curel Age Defying Therapeutic Moisturizing Lotion with Alpha Hydroxy  Curel Extreme Care Body Lotion  Curel Soothing Hands Moisturizing Hand Lotion  Curel Therapeutic Moisturizing Cream, Fragrance-Free  Curel Therapeutic Moisturizing Lotion, Fragrance-Free  Curel Therapeutic Moisturizing Lotion, Original Formula  Eucerin Daily Replenishing Lotion  Eucerin Dry Skin Therapy Plus Alpha Hydroxy Crme  Eucerin Dry Skin Therapy Plus Alpha Hydroxy Lotion  Eucerin Original Crme  Eucerin Original Lotion  Eucerin Plus Crme Eucerin Plus Lotion  Eucerin TriLipid Replenishing Lotion  Keri Anti-Bacterial Hand Lotion  Keri Deep Conditioning Original Lotion Dry Skin Formula Softly Scented  Keri Deep Conditioning Original Lotion, Fragrance Free Sensitive Skin Formula  Keri Lotion Fast Absorbing  Fragrance Free Sensitive Skin Formula  Keri Lotion Fast Absorbing Softly Scented Dry Skin Formula  Keri Original Lotion  Keri Skin Renewal Lotion Keri Silky Smooth Lotion  Keri Silky Smooth Sensitive Skin Lotion  Nivea Body Creamy Conditioning Oil  Nivea Body Extra Enriched Lotion  Nivea Body Original Lotion  Nivea Body Sheer Moisturizing Lotion Nivea Crme  Nivea Skin Firming Lotion  NutraDerm 30 Skin Lotion  NutraDerm Skin Lotion  NutraDerm Therapeutic Skin Cream  NutraDerm Therapeutic Skin Lotion  ProShield Protective Hand Cream  Provon moisturizing lotion  Please read over the following fact sheets that you were given.

## 2023-10-28 ENCOUNTER — Encounter (HOSPITAL_COMMUNITY)
Admission: RE | Admit: 2023-10-28 | Discharge: 2023-10-28 | Disposition: A | Discharge status: Home / Self Care | Source: Ambulatory Visit | Attending: Orthopaedic Surgery | Admitting: Orthopaedic Surgery

## 2023-10-28 ENCOUNTER — Other Ambulatory Visit: Payer: Self-pay

## 2023-10-28 ENCOUNTER — Encounter (HOSPITAL_COMMUNITY): Payer: Self-pay

## 2023-10-28 VITALS — BP 139/87 | HR 77 | Temp 98.3°F | Resp 16 | Ht 63.0 in | Wt 189.0 lb

## 2023-10-28 DIAGNOSIS — M87052 Idiopathic aseptic necrosis of left femur: Secondary | ICD-10-CM | POA: Insufficient documentation

## 2023-10-28 DIAGNOSIS — Z01818 Encounter for other preprocedural examination: Secondary | ICD-10-CM | POA: Insufficient documentation

## 2023-10-28 DIAGNOSIS — I10 Essential (primary) hypertension: Secondary | ICD-10-CM | POA: Diagnosis not present

## 2023-10-28 HISTORY — DX: Personal history of urinary calculi: Z87.442

## 2023-10-28 LAB — CBC
HCT: 36.2 % (ref 36.0–46.0)
Hemoglobin: 11.2 g/dL — ABNORMAL LOW (ref 12.0–15.0)
MCH: 30.4 pg (ref 26.0–34.0)
MCHC: 30.9 g/dL (ref 30.0–36.0)
MCV: 98.1 fL (ref 80.0–100.0)
Platelets: 466 10*3/uL — ABNORMAL HIGH (ref 150–400)
RBC: 3.69 MIL/uL — ABNORMAL LOW (ref 3.87–5.11)
RDW: 14 % (ref 11.5–15.5)
WBC: 10.3 10*3/uL (ref 4.0–10.5)
nRBC: 0 % (ref 0.0–0.2)

## 2023-10-28 LAB — BASIC METABOLIC PANEL WITH GFR
Anion gap: 6 (ref 5–15)
BUN: 11 mg/dL (ref 6–20)
CO2: 25 mmol/L (ref 22–32)
Calcium: 8.9 mg/dL (ref 8.9–10.3)
Chloride: 108 mmol/L (ref 98–111)
Creatinine, Ser: 0.82 mg/dL (ref 0.44–1.00)
GFR, Estimated: >60 mL/min (ref >60)
Glucose, Bld: 108 mg/dL — ABNORMAL HIGH (ref 70–99)
Potassium: 4.6 mmol/L (ref 3.5–5.1)
Sodium: 139 mmol/L (ref 135–145)

## 2023-10-28 LAB — TYPE AND SCREEN
ABO/RH(D): B POS
Antibody Screen: NEGATIVE

## 2023-10-28 LAB — SURGICAL PCR SCREEN
MRSA, PCR: NEGATIVE
Staphylococcus aureus: POSITIVE — AB

## 2023-10-28 NOTE — Progress Notes (Signed)
 PCP - Naomia Bachelor, PA Cardiologist - denies  PPM/ICD - denies   Chest x-ray - 06/17/21 EKG - 10/28/23 Stress Test - denies ECHO - denies Cardiac Cath - denies  Sleep Study - denies   DM- denies  Last dose of GLP1 agonist-  n/a   ASA/Blood Thinner Instructions: n/a   ERAS Protcol - clears until 0545 PRE-SURGERY Ensure given  COVID TEST- n/a   Anesthesia review: no  Patient denies shortness of breath, fever, cough and chest pain at PAT appointment   All instructions explained to the patient, with a verbal understanding of the material. Patient agrees to go over the instructions while at home for a better understanding.  The opportunity to ask questions was provided.

## 2023-10-29 ENCOUNTER — Other Ambulatory Visit: Payer: Self-pay | Admitting: Orthopaedic Surgery

## 2023-10-29 ENCOUNTER — Encounter: Payer: Self-pay | Admitting: Orthopaedic Surgery

## 2023-10-29 MED ORDER — HYDROCODONE-ACETAMINOPHEN 5-325 MG PO TABS: 1.0000 | ORAL_TABLET | Freq: Every day | ORAL | 0 refills | Status: DC | PRN

## 2023-11-04 ENCOUNTER — Ambulatory Visit (HOSPITAL_COMMUNITY)

## 2023-11-04 ENCOUNTER — Encounter (HOSPITAL_COMMUNITY)
Admission: RE | Disposition: A | Discharge status: Home / Self Care | Payer: Self-pay | Source: Home / Self Care | Attending: Orthopaedic Surgery

## 2023-11-04 ENCOUNTER — Ambulatory Visit (HOSPITAL_COMMUNITY): Admitting: Registered Nurse

## 2023-11-04 ENCOUNTER — Other Ambulatory Visit: Payer: Self-pay

## 2023-11-04 ENCOUNTER — Ambulatory Visit (HOSPITAL_COMMUNITY)
Admission: RE | Admit: 2023-11-04 | Discharge: 2023-11-04 | Disposition: A | Discharge status: Home / Self Care | Attending: Orthopaedic Surgery | Admitting: Orthopaedic Surgery

## 2023-11-04 DIAGNOSIS — Z87891 Personal history of nicotine dependence: Secondary | ICD-10-CM | POA: Insufficient documentation

## 2023-11-04 DIAGNOSIS — M25752 Osteophyte, left hip: Secondary | ICD-10-CM | POA: Diagnosis not present

## 2023-11-04 DIAGNOSIS — M87052 Idiopathic aseptic necrosis of left femur: Secondary | ICD-10-CM | POA: Diagnosis present

## 2023-11-04 DIAGNOSIS — I10 Essential (primary) hypertension: Secondary | ICD-10-CM | POA: Insufficient documentation

## 2023-11-04 DIAGNOSIS — F32A Depression, unspecified: Secondary | ICD-10-CM | POA: Diagnosis not present

## 2023-11-04 DIAGNOSIS — Z01818 Encounter for other preprocedural examination: Secondary | ICD-10-CM

## 2023-11-04 HISTORY — PX: TOTAL HIP ARTHROPLASTY: SHX124

## 2023-11-04 LAB — POCT PREGNANCY, URINE: Preg Test, Ur: NEGATIVE

## 2023-11-04 SURGERY — ARTHROPLASTY, HIP, TOTAL, ANTERIOR APPROACH
Anesthesia: Monitor Anesthesia Care | Site: Hip | Laterality: Left

## 2023-11-04 MED ORDER — FENTANYL CITRATE (PF) 250 MCG/5ML IJ SOLN
INTRAMUSCULAR | Status: AC
  Filled 2023-11-04: qty 5

## 2023-11-04 MED ORDER — PROPOFOL 500 MG/50ML IV EMUL
INTRAVENOUS | Status: DC | PRN
  Administered 2023-11-04: 100 ug/kg/min via INTRAVENOUS

## 2023-11-04 MED ORDER — ALUM & MAG HYDROXIDE-SIMETH 200-200-20 MG/5ML PO SUSP
30.0000 mL | ORAL | Status: DC | PRN
  Filled 2023-11-04: qty 30

## 2023-11-04 MED ORDER — HYDROMORPHONE HCL 1 MG/ML IJ SOLN
0.2500 mg | INTRAMUSCULAR | Status: DC | PRN
  Administered 2023-11-04 (×4): 0.5 mg via INTRAVENOUS

## 2023-11-04 MED ORDER — FENTANYL CITRATE (PF) 100 MCG/2ML IJ SOLN
INTRAMUSCULAR | Status: AC
  Filled 2023-11-04: qty 2

## 2023-11-04 MED ORDER — BUPIVACAINE-MELOXICAM ER 400-12 MG/14ML IJ SOLN
INTRAMUSCULAR | Status: DC | PRN
  Administered 2023-11-04: 400 mg

## 2023-11-04 MED ORDER — MIDAZOLAM HCL 2 MG/2ML IJ SOLN
INTRAMUSCULAR | Status: AC
Start: 2023-11-04 — End: ?
  Filled 2023-11-04: qty 2

## 2023-11-04 MED ORDER — FENTANYL CITRATE (PF) 250 MCG/5ML IJ SOLN
INTRAMUSCULAR | Status: DC | PRN
  Administered 2023-11-04 (×3): 50 ug via INTRAVENOUS

## 2023-11-04 MED ORDER — DIPHENHYDRAMINE HCL 12.5 MG/5ML PO ELIX: 25.0000 mg | ORAL_SOLUTION | ORAL | Status: DC | PRN

## 2023-11-04 MED ORDER — 0.9 % SODIUM CHLORIDE (POUR BTL) OPTIME
TOPICAL | Status: DC | PRN
  Administered 2023-11-04: 1000 mL

## 2023-11-04 MED ORDER — CEFAZOLIN SODIUM-DEXTROSE 2-4 GM/100ML-% IV SOLN: 2.0000 g | Freq: Four times a day (QID) | INTRAVENOUS | Status: DC

## 2023-11-04 MED ORDER — HYDROMORPHONE HCL 1 MG/ML IJ SOLN
INTRAMUSCULAR | Status: AC
  Filled 2023-11-04: qty 1

## 2023-11-04 MED ORDER — ONDANSETRON HCL 4 MG/2ML IJ SOLN
INTRAMUSCULAR | Status: DC | PRN
  Administered 2023-11-04: 4 mg via INTRAVENOUS

## 2023-11-04 MED ORDER — ONDANSETRON HCL 4 MG/2ML IJ SOLN: 4.0000 mg | Freq: Four times a day (QID) | INTRAMUSCULAR | Status: DC | PRN

## 2023-11-04 MED ORDER — OXYCODONE HCL 5 MG PO TABS: 5.0000 mg | ORAL_TABLET | Freq: Once | ORAL | Status: DC | PRN

## 2023-11-04 MED ORDER — EPHEDRINE SULFATE-NACL 50-0.9 MG/10ML-% IV SOSY
PREFILLED_SYRINGE | INTRAVENOUS | Status: DC | PRN
  Administered 2023-11-04 (×2): 5 mg via INTRAVENOUS

## 2023-11-04 MED ORDER — SODIUM CHLORIDE 0.9 % IR SOLN
Status: DC | PRN
  Administered 2023-11-04: 1000 mL

## 2023-11-04 MED ORDER — TRANEXAMIC ACID-NACL 1000-0.7 MG/100ML-% IV SOLN: 1000.0000 mg | Freq: Once | INTRAVENOUS | Status: DC

## 2023-11-04 MED ORDER — HYDROMORPHONE HCL 1 MG/ML IJ SOLN
INTRAMUSCULAR | Status: AC
Start: 2023-11-04 — End: ?
  Filled 2023-11-04: qty 1

## 2023-11-04 MED ORDER — ACETAMINOPHEN 325 MG PO TABS: 325.0000 mg | ORAL_TABLET | Freq: Four times a day (QID) | ORAL | Status: DC | PRN

## 2023-11-04 MED ORDER — OXYCODONE HCL 5 MG PO TABS
5.0000 mg | ORAL_TABLET | ORAL | Status: DC | PRN
  Administered 2023-11-04: 10 mg via ORAL

## 2023-11-04 MED ORDER — PRONTOSAN WOUND IRRIGATION OPTIME
TOPICAL | Status: DC | PRN
  Administered 2023-11-04: 350 mL

## 2023-11-04 MED ORDER — DEXAMETHASONE SODIUM PHOSPHATE 10 MG/ML IJ SOLN
10.0000 mg | Freq: Once | INTRAMUSCULAR | Status: DC
  Filled 2023-11-04: qty 1

## 2023-11-04 MED ORDER — LACTATED RINGERS IV BOLUS: 500.0000 mL | Freq: Once | INTRAVENOUS | Status: DC

## 2023-11-04 MED ORDER — MIDAZOLAM HCL 2 MG/2ML IJ SOLN
INTRAMUSCULAR | Status: AC
  Filled 2023-11-04: qty 2

## 2023-11-04 MED ORDER — POVIDONE-IODINE 10 % EX SWAB
2.0000 | Freq: Once | CUTANEOUS | Status: AC
  Administered 2023-11-04: 2 via TOPICAL

## 2023-11-04 MED ORDER — PHENOL 1.4 % MT LIQD
1.0000 | OROMUCOSAL | Status: DC | PRN
  Filled 2023-11-04: qty 177

## 2023-11-04 MED ORDER — METOCLOPRAMIDE HCL 5 MG/ML IJ SOLN: 5.0000 mg | Freq: Three times a day (TID) | INTRAMUSCULAR | Status: DC | PRN

## 2023-11-04 MED ORDER — MIDAZOLAM HCL 2 MG/2ML IJ SOLN
INTRAMUSCULAR | Status: DC | PRN
  Administered 2023-11-04 (×2): 2 mg via INTRAVENOUS

## 2023-11-04 MED ORDER — MENTHOL 3 MG MT LOZG
1.0000 | LOZENGE | OROMUCOSAL | Status: DC | PRN
  Filled 2023-11-04: qty 9

## 2023-11-04 MED ORDER — ORAL CARE MOUTH RINSE: 15.0000 mL | Freq: Once | OROMUCOSAL | Status: AC

## 2023-11-04 MED ORDER — VANCOMYCIN HCL 1 G IV SOLR
INTRAVENOUS | Status: DC | PRN
  Administered 2023-11-04: 1000 mg

## 2023-11-04 MED ORDER — ONDANSETRON HCL 4 MG PO TABS: 4.0000 mg | ORAL_TABLET | Freq: Four times a day (QID) | ORAL | Status: DC | PRN

## 2023-11-04 MED ORDER — METHYLPHENIDATE HCL ER (OSM) 36 MG PO TBCR: 36.0000 mg | EXTENDED_RELEASE_TABLET | Freq: Every day | ORAL | Status: DC

## 2023-11-04 MED ORDER — CHLORHEXIDINE GLUCONATE 4 % EX SOLN: 1.0000 | CUTANEOUS | 1 refills | Status: DC

## 2023-11-04 MED ORDER — FENTANYL CITRATE (PF) 100 MCG/2ML IJ SOLN
25.0000 ug | INTRAMUSCULAR | Status: DC | PRN
  Administered 2023-11-04 (×4): 50 ug via INTRAVENOUS

## 2023-11-04 MED ORDER — METHOCARBAMOL 1000 MG/10ML IJ SOLN: 500.0000 mg | Freq: Four times a day (QID) | INTRAMUSCULAR | Status: DC | PRN

## 2023-11-04 MED ORDER — MUPIROCIN 2 % EX OINT: 1.0000 | TOPICAL_OINTMENT | Freq: Two times a day (BID) | CUTANEOUS | 0 refills | Status: AC

## 2023-11-04 MED ORDER — ACETAMINOPHEN 500 MG PO TABS: 1000.0000 mg | ORAL_TABLET | Freq: Four times a day (QID) | ORAL | Status: DC

## 2023-11-04 MED ORDER — OXYCODONE HCL ER 10 MG PO T12A
10.0000 mg | EXTENDED_RELEASE_TABLET | Freq: Two times a day (BID) | ORAL | Status: DC
Start: 2023-11-04 — End: 2023-11-04

## 2023-11-04 MED ORDER — BUPROPION HCL ER (XL) 300 MG PO TB24
300.0000 mg | ORAL_TABLET | Freq: Every day | ORAL | Status: DC
  Filled 2023-11-04: qty 1

## 2023-11-04 MED ORDER — CEFAZOLIN SODIUM-DEXTROSE 2-4 GM/100ML-% IV SOLN
2.0000 g | INTRAVENOUS | Status: AC
  Administered 2023-11-04: 2 g via INTRAVENOUS
  Filled 2023-11-04: qty 100

## 2023-11-04 MED ORDER — TOPIRAMATE 25 MG PO TABS
50.0000 mg | ORAL_TABLET | Freq: Every day | ORAL | Status: DC
  Filled 2023-11-04: qty 2

## 2023-11-04 MED ORDER — VANCOMYCIN HCL 1000 MG IV SOLR
INTRAVENOUS | Status: AC
  Filled 2023-11-04: qty 20

## 2023-11-04 MED ORDER — DOCUSATE SODIUM 100 MG PO CAPS
100.0000 mg | ORAL_CAPSULE | Freq: Two times a day (BID) | ORAL | Status: DC
Start: 2023-11-04 — End: 2023-11-04
  Filled 2023-11-04: qty 1

## 2023-11-04 MED ORDER — ACETAMINOPHEN 10 MG/ML IV SOLN
1000.0000 mg | Freq: Once | INTRAVENOUS | Status: DC | PRN
  Administered 2023-11-04: 1000 mg via INTRAVENOUS

## 2023-11-04 MED ORDER — SORBITOL 70 % SOLN
30.0000 mL | Freq: Every day | Status: DC | PRN
  Filled 2023-11-04: qty 30

## 2023-11-04 MED ORDER — MAGNESIUM CITRATE PO SOLN
1.0000 | Freq: Once | ORAL | Status: DC | PRN
  Filled 2023-11-04: qty 296

## 2023-11-04 MED ORDER — METOCLOPRAMIDE HCL 5 MG PO TABS
5.0000 mg | ORAL_TABLET | Freq: Three times a day (TID) | ORAL | Status: DC | PRN
  Filled 2023-11-04: qty 2

## 2023-11-04 MED ORDER — HYDROMORPHONE HCL 1 MG/ML IJ SOLN
0.5000 mg | INTRAMUSCULAR | Status: DC | PRN
  Administered 2023-11-04: 1 mg via INTRAVENOUS

## 2023-11-04 MED ORDER — BUPIVACAINE-MELOXICAM ER 400-12 MG/14ML IJ SOLN
INTRAMUSCULAR | Status: AC
Start: 2023-11-04 — End: ?
  Filled 2023-11-04: qty 1

## 2023-11-04 MED ORDER — ACETAMINOPHEN 10 MG/ML IV SOLN
INTRAVENOUS | Status: AC
  Filled 2023-11-04: qty 100

## 2023-11-04 MED ORDER — TRANEXAMIC ACID-NACL 1000-0.7 MG/100ML-% IV SOLN
1000.0000 mg | INTRAVENOUS | Status: AC
  Administered 2023-11-04: 1000 mg via INTRAVENOUS
  Filled 2023-11-04: qty 100

## 2023-11-04 MED ORDER — DEXAMETHASONE SODIUM PHOSPHATE 10 MG/ML IJ SOLN
INTRAMUSCULAR | Status: DC | PRN
  Administered 2023-11-04: 5 mg via INTRAVENOUS

## 2023-11-04 MED ORDER — LACTATED RINGERS IV SOLN
INTRAVENOUS | Status: DC
Start: 2023-11-04 — End: 2023-11-04

## 2023-11-04 MED ORDER — PROPOFOL 10 MG/ML IV BOLUS
INTRAVENOUS | Status: DC | PRN
  Administered 2023-11-04 (×2): 50 mg via INTRAVENOUS

## 2023-11-04 MED ORDER — METHOCARBAMOL 500 MG PO TABS: 500.0000 mg | ORAL_TABLET | Freq: Four times a day (QID) | ORAL | Status: DC | PRN

## 2023-11-04 MED ORDER — TRANEXAMIC ACID 1000 MG/10ML IV SOLN
2000.0000 mg | INTRAVENOUS | Status: DC
  Filled 2023-11-04: qty 20

## 2023-11-04 MED ORDER — OXYCODONE HCL 5 MG/5ML PO SOLN: 5.0000 mg | Freq: Once | ORAL | Status: DC | PRN

## 2023-11-04 MED ORDER — LACTATED RINGERS IV BOLUS: 250.0000 mL | Freq: Once | INTRAVENOUS | Status: DC

## 2023-11-04 MED ORDER — FENTANYL CITRATE (PF) 100 MCG/2ML IJ SOLN
INTRAMUSCULAR | Status: AC
Start: 2023-11-04 — End: ?
  Filled 2023-11-04: qty 2

## 2023-11-04 MED ORDER — ASPIRIN 81 MG PO CHEW
81.0000 mg | CHEWABLE_TABLET | Freq: Two times a day (BID) | ORAL | Status: DC
  Filled 2023-11-04: qty 1

## 2023-11-04 MED ORDER — POLYETHYLENE GLYCOL 3350 17 G PO PACK
17.0000 g | PACK | Freq: Every day | ORAL | Status: DC
Start: 2023-11-04 — End: 2023-11-04

## 2023-11-04 MED ORDER — OXYCODONE HCL 5 MG PO TABS
ORAL_TABLET | ORAL | Status: AC
  Filled 2023-11-04: qty 2

## 2023-11-04 MED ORDER — OXYCODONE HCL 5 MG PO TABS: 10.0000 mg | ORAL_TABLET | ORAL | Status: DC | PRN

## 2023-11-04 MED ORDER — TRANEXAMIC ACID 1000 MG/10ML IV SOLN
INTRAVENOUS | Status: DC | PRN
  Administered 2023-11-04: 2000 mg via TOPICAL

## 2023-11-04 MED ORDER — CHLORHEXIDINE GLUCONATE 0.12 % MT SOLN
15.0000 mL | Freq: Once | OROMUCOSAL | Status: AC
  Administered 2023-11-04: 15 mL via OROMUCOSAL
  Filled 2023-11-04: qty 15

## 2023-11-04 MED ORDER — PANTOPRAZOLE SODIUM 40 MG PO TBEC: 40.0000 mg | DELAYED_RELEASE_TABLET | Freq: Every day | ORAL | Status: DC

## 2023-11-04 SURGICAL SUPPLY — 57 items
BAG COUNTER SPONGE SURGICOUNT (BAG) ×1 IMPLANT
BAG DECANTER FOR FLEXI CONT (MISCELLANEOUS) ×1 IMPLANT
BLADE SAG 18X100X1.27 (BLADE) ×1 IMPLANT
CELLS DAT CNTRL 66122 CELL SVR (MISCELLANEOUS) ×1 IMPLANT
COOLER ICEMAN CLASSIC (MISCELLANEOUS) IMPLANT
COVER PERINEAL POST (MISCELLANEOUS) ×1 IMPLANT
COVER SURGICAL LIGHT HANDLE (MISCELLANEOUS) ×1 IMPLANT
DERMABOND ADVANCED .7 DNX12 (GAUZE/BANDAGES/DRESSINGS) IMPLANT
DRAPE C-ARM 42X72 X-RAY (DRAPES) ×1 IMPLANT
DRAPE POUCH INSTRU U-SHP 10X18 (DRAPES) ×1 IMPLANT
DRAPE STERI IOBAN 125X83 (DRAPES) ×1 IMPLANT
DRAPE U-SHAPE 47X51 STRL (DRAPES) ×2 IMPLANT
DRSG AQUACEL AG ADV 3.5X10 (GAUZE/BANDAGES/DRESSINGS) ×1 IMPLANT
DURAPREP 26ML APPLICATOR (WOUND CARE) ×2 IMPLANT
ELECTRODE BLDE 4.0 EZ CLN MEGD (MISCELLANEOUS) ×1 IMPLANT
ELECTRODE REM PT RTRN 9FT ADLT (ELECTROSURGICAL) ×1 IMPLANT
GLOVE BIOGEL PI IND STRL 7.0 (GLOVE) ×2 IMPLANT
GLOVE BIOGEL PI IND STRL 7.5 (GLOVE) ×5 IMPLANT
GLOVE ECLIPSE 7.0 STRL STRAW (GLOVE) ×2 IMPLANT
GLOVE SKINSENSE STRL SZ7.5 (GLOVE) ×1 IMPLANT
GLOVE SURG SYN 7.5 E (GLOVE) ×2 IMPLANT
GLOVE SURG SYN 7.5 PF PI (GLOVE) ×2 IMPLANT
GLOVE SURG UNDER POLY LF SZ7 (GLOVE) ×3 IMPLANT
GLOVE SURG UNDER POLY LF SZ7.5 (GLOVE) ×2 IMPLANT
GOWN STRL REUS W/ TWL LRG LVL3 (GOWN DISPOSABLE) IMPLANT
GOWN STRL REUS W/ TWL XL LVL3 (GOWN DISPOSABLE) ×1 IMPLANT
GOWN STRL SURGICAL XL XLNG (GOWN DISPOSABLE) ×1 IMPLANT
GOWN TOGA ZIPPER T7+ PEEL AWAY (MISCELLANEOUS) ×1 IMPLANT
HEAD CERAMIC DELTA 36 PLUS 1.5 (Hips) IMPLANT
HOOD PEEL AWAY T7 (MISCELLANEOUS) ×1 IMPLANT
IV NS IRRIG 3000ML ARTHROMATIC (IV SOLUTION) ×1 IMPLANT
KIT BASIN OR (CUSTOM PROCEDURE TRAY) ×1 IMPLANT
LINER NEUTRAL 52X36MM PLUS 4 (Liner) IMPLANT
MARKER SKIN DUAL TIP RULER LAB (MISCELLANEOUS) ×1 IMPLANT
NDL SPNL 18GX3.5 QUINCKE PK (NEEDLE) ×1 IMPLANT
NEEDLE SPNL 18GX3.5 QUINCKE PK (NEEDLE) ×1 IMPLANT
PACK TOTAL JOINT (CUSTOM PROCEDURE TRAY) ×1 IMPLANT
PACK UNIVERSAL I (CUSTOM PROCEDURE TRAY) ×1 IMPLANT
PIN SECTOR W/GRIP ACE CUP 52MM (Hips) IMPLANT
RETRACTOR WND ALEXIS 18 MED (MISCELLANEOUS) IMPLANT
SCREW 6.5MMX25MM (Screw) IMPLANT
SET HNDPC FAN SPRY TIP SCT (DISPOSABLE) ×1 IMPLANT
SOLUTION PRONTOSAN WOUND 350ML (IRRIGATION / IRRIGATOR) ×1 IMPLANT
STEM FEM ACTIS STD SZ4 (Stem) IMPLANT
SUT ETHIBOND 2 V 37 (SUTURE) ×1 IMPLANT
SUT ETHILON 2 0 FS 18 (SUTURE) IMPLANT
SUT STRATAFIX 1PDS 45CM VIOLET (SUTURE) IMPLANT
SUT VIC AB 0 CT1 27XBRD ANBCTR (SUTURE) ×1 IMPLANT
SUT VIC AB 1 CTX36XBRD ANBCTR (SUTURE) ×1 IMPLANT
SUT VIC AB 2-0 CT1 (SUTURE) IMPLANT
SUT VIC AB 2-0 CT1 TAPERPNT 27 (SUTURE) ×2 IMPLANT
SYR 50ML LL SCALE MARK (SYRINGE) ×1 IMPLANT
TOWEL GREEN STERILE (TOWEL DISPOSABLE) ×1 IMPLANT
TRAY CATH INTERMITTENT SS 16FR (CATHETERS) IMPLANT
TRAY FOLEY W/BAG SLVR 16FR ST (SET/KITS/TRAYS/PACK) IMPLANT
TUBE SUCT ARGYLE STRL (TUBING) ×1 IMPLANT
YANKAUER SUCT BULB TIP NO VENT (SUCTIONS) ×1 IMPLANT

## 2023-11-04 NOTE — Anesthesia Procedure Notes (Signed)
 Spinal  Patient location during procedure: OR Start time: 11/04/2023 8:41 AM End time: 11/04/2023 8:47 AM Reason for block: surgical anesthesia Staffing Performed: anesthesiologist  Anesthesiologist: Arvie Latus, MD Performed by: Arvie Latus, MD Authorized by: Arvie Latus, MD   Preanesthetic Checklist Completed: patient identified, IV checked, risks and benefits discussed, surgical consent, monitors and equipment checked, pre-op evaluation and timeout performed Spinal Block Patient position: sitting Prep: DuraPrep Patient monitoring: heart rate, cardiac monitor, continuous pulse ox and blood pressure Approach: midline Location: L4-5 Injection technique: single-shot Needle Needle type: Pencan  Needle gauge: 24 G Needle length: 9 cm Assessment Sensory level: T6 Events: CSF return

## 2023-11-04 NOTE — Evaluation (Signed)
 Physical Therapy Evaluation Patient Details Name: [name] [name] MRN: [id number] DOB: 27-Dec-1980 Today's Date: 11/04/2023  History of Present Illness  43 y.o. female presents to [facility] hospital on 11/04/2023 for elective L THA. PMH includes ADHD, R THA.  Clinical Impression  Pt presents to PT with deficits in strength, power, gait, balance. Pt is mobilizing well, ambulating with support of a RW for household distances. Pt also negotiates 3 steps with unilateral UE support to simulate use of a cane. PT provides education on surgical hip exercise packet and provides encouragement for frequent mobilization at the time of discharge. PT recommends discharge home when medically appropriate.        If plan is discharge home, recommend the following: A little help with bathing/dressing/bathroom;Assistance with cooking/housework;Assist for transportation;Help with stairs or ramp for entrance   Can travel by private vehicle        Equipment Recommendations None recommended by PT  Recommendations for Other Services       Functional Status Assessment Patient has had a recent decline in their functional status and demonstrates the ability to make significant improvements in function in a reasonable and predictable amount of time.     Precautions / Restrictions Precautions Precautions: Fall Recall of Precautions/Restrictions: Intact Precaution/Restrictions Comments: L THA, direct anterior approach, no precautions Restrictions Weight Bearing Restrictions Per Provider Order: Yes LLE Weight Bearing Per Provider Order: Weight bearing as tolerated      Mobility  Bed Mobility Overal bed mobility: Needs Assistance Bed Mobility: Supine to Sit     Supine to sit: Supervision          Transfers Overall transfer level: Needs assistance Equipment used: Rolling walker (2 wheels) Transfers: Sit to/from Stand Sit to Stand: Supervision                Ambulation/Gait Ambulation/Gait  assistance: Supervision Gait Distance (Feet): 80 Feet Assistive device: Rolling walker (2 wheels) Gait Pattern/deviations: Step-through pattern Gait velocity: reduced Gait velocity interpretation: <1.8 ft/sec, indicate of risk for recurrent falls   General Gait Details: slowed step-through gait, reduced stance time on LLE  Stairs Stairs: Yes Stairs assistance: Contact guard assist Stair Management: Forwards (hand hold to simulate cane) Number of Stairs: 3    Wheelchair Mobility     Tilt Bed    Modified Rankin (Stroke Patients Only)       Balance Overall balance assessment: Needs assistance Sitting-balance support: No upper extremity supported, Feet supported Sitting balance-Leahy Scale: Good     Standing balance support: Single extremity supported, Reliant on assistive device for balance Standing balance-Leahy Scale: Poor                               Pertinent Vitals/Pain Pain Assessment Pain Assessment: 0-10 Pain Score: 6  Pain Location: L hip Pain Descriptors / Indicators: Sore Pain Intervention(s): Monitored during session    Home Living Family/patient expects to be discharged to:: Private residence Living Arrangements: Spouse/significant other Available Help at Discharge: Family;Available 24 hours/day (fiance available 24/7 for first week) Type of Home: House Home Access: Stairs to enter Entrance Stairs-Rails: None Entrance Stairs-Number of Steps: 3   Home Layout: One level Home Equipment: Agricultural consultant (2 wheels);Cane - single point;Crutches;Wheelchair - manual      Prior Function Prior Level of Function : Independent/Modified Independent;Working/employed;Driving             Mobility Comments: working as a Leisure centre manager at [employer]  Extremity/Trunk Assessment   Upper Extremity Assessment Upper Extremity Assessment: Overall WFL for tasks assessed    Lower Extremity Assessment Lower Extremity Assessment: LLE  deficits/detail LLE Deficits / Details: generalized post-op weakness    Cervical / Trunk Assessment Cervical / Trunk Assessment: Normal  Communication   Communication Communication: No apparent difficulties    Cognition Arousal: Alert Behavior During Therapy: WFL for tasks assessed/performed   PT - Cognitive impairments: No apparent impairments                         Following commands: Intact       Cueing Cueing Techniques: Verbal cues     General Comments General comments (skin integrity, edema, etc.): VSS on RA    Exercises Other Exercises Other Exercises: PT provides education on surgical hip exercise packet   Assessment/Plan    PT Assessment Patient needs continued PT services  PT Problem List Decreased strength;Decreased activity tolerance;Decreased balance;Decreased mobility;Decreased knowledge of use of DME;Pain       PT Treatment Interventions DME instruction;Gait training;Stair training;Functional mobility training;Therapeutic activities;Therapeutic exercise;Balance training;Neuromuscular re-education;Patient/family education    PT Goals (Current goals can be found in the Care Plan section)  Acute Rehab PT Goals Patient Stated Goal: to return home PT Goal Formulation: With patient Time For Goal Achievement: 11/08/23 Potential to Achieve Goals: Good    Frequency 7X/week     Co-evaluation               AM-PAC PT "6 Clicks" Mobility  Outcome Measure Help needed turning from your back to your side while in a flat bed without using bedrails?: None Help needed moving from lying on your back to sitting on the side of a flat bed without using bedrails?: A Little Help needed moving to and from a bed to a chair (including a wheelchair)?: A Little Help needed standing up from a chair using your arms (e.g., wheelchair or bedside chair)?: A Little Help needed to walk in hospital room?: A Little Help needed climbing 3-5 steps with a railing? : A  Little 6 Click Score: 19    End of Session Equipment Utilized During Treatment: Gait belt Activity Tolerance: Patient tolerated treatment well Patient left: in chair;with call bell/phone within reach Nurse Communication: Mobility status PT Visit Diagnosis: Other abnormalities of gait and mobility (R26.89);Muscle weakness (generalized) (M62.81)    Time: 1610-9604 PT Time Calculation (min) (ACUTE ONLY): 31 min   Charges:   PT Evaluation $PT Eval Low Complexity: 1 Low   PT General Charges $$ ACUTE PT VISIT: 1 Visit         Rexie Catena, PT, DPT Acute Rehabilitation Office [phone]   Rexie Catena 11/04/2023, 1:54 PM

## 2023-11-04 NOTE — Discharge Instructions (Signed)

## 2023-11-04 NOTE — Op Note (Signed)
 ARTHROPLASTY, HIP, TOTAL, ANTERIOR APPROACH  Procedure Note [name] [name]   [id number]  Pre-op Diagnosis: LEFT HIP AVASCULAR NECROSIS     Post-op Diagnosis: same  Operative Findings Delaminated cartilage consistent with AVN Degenerative changes within the hip joint   Operative Procedures  1. Total hip replacement; Left hip; uncemented [id number]   Surgeon: Dyana Glade, M.D.  Assist: Sharran Decent, PA-C   Anesthesia: spinal  Prosthesis: Depuy Acetabulum: Pinnacle 52 mm Femur: Actis 4 STD Head: 36 mm size: +1.5 Liner: +4 neutral Bearing Type: ceramic/poly  Total Hip Arthroplasty (Anterior Approach) Op Note:  After informed consent was obtained and the operative extremity marked in the holding area, the patient was brought back to the operating room and placed supine on the HANA table. Next, the operative extremity was prepped and draped in normal sterile fashion. Surgical timeout occurred verifying patient identification, surgical site, surgical procedure and administration of antibiotics.  A 10 cm longitudinal incision was made starting from 2 fingerbreadths lateral and inferior to the ASIS towards the lateral aspect of the patella.  A Hueter approach to the hip was performed, using the interval between tensor fascia lata and sartorius.  Dissection was carried bluntly down onto the anterior hip capsule. The lateral femoral circumflex vessels were identified and coagulated. A capsulotomy was performed and the capsular flaps tagged for later repair.  The neck osteotomy was performed. The femoral head was removed which showed collapse of subchondral bone and delaminated cartilage, the acetabular rim was cleared of soft tissue and osteophytes and attention was turned to reaming the acetabulum.  Sequential reaming was performed under fluoroscopic guidance down to the floor of the cotyloid fossa. We reamed to a size 51 mm, and then impacted the acetabular shell. A 25 mm  cancellous screw was placed to secure the shell.  The liner was then placed after irrigation and attention turned to the femur.  After placing the femoral hook, the leg was taken to externally rotated, extended and adducted position taking care to perform soft tissue releases to allow for adequate mobilization of the femur. Soft tissue was cleared from the shoulder of the greater trochanter and the hook elevator used to improve exposure of the proximal femur. Sequential broaching performed up to a size 4. Standard trial neck and +1.5 head were placed. The leg was brought back up to neutral and the construct reduced.  The position and sizing of components, offset and leg lengths were checked using fluoroscopy. Stability of the construct was checked in 45 degrees of hip extension and 90 degrees of external rotation without any subluxation, shuck or impingement of prosthesis. We dislocated the prosthesis, dropped the leg back into position, removed trial components, and irrigated copiously. The final stem and head was then placed, the leg brought back up, the system reduced and fluoroscopy used to verify positioning.  Antibiotic irrigation was placed in the surgical wound.   We irrigated, obtained hemostasis and closed the capsule using #2 ethibond suture.  A topical mixture of 0.25% bupivacaine  and meloxicam  was placed deep to the fascia.  One gram of vancomycin  powder was placed in the surgical bed.   One gram of topical tranexamic acid  was injected into the joint.  The fascia was closed with #1 stratafix, the deep fat layer was closed with 0 vicryl, the subcutaneous layers closed with 2.0 Vicryl Plus and the skin closed with 2.0 nylon and dermabond. A sterile dressing was applied. The patient was awakened in the operating room and  taken to recovery in stable condition.  All sponge, needle, and instrument counts were correct at the end of the case.   Trellis Fries, my PA, was a medical necessity for opening,  closing, limb positioning, retracting, exposing, and overall facilitation and timely completion of the surgery.  Position: supine  Complications: see description of procedure.  Time Out: performed   Drains/Packing: none  Estimated blood loss: see anesthesia record  Returned to Recovery Room: in good condition.   Antibiotics: yes   Mechanical VTE (DVT) Prophylaxis: sequential compression devices, TED thigh-high  Chemical VTE (DVT) Prophylaxis: aspirin    Fluid Replacement: see anesthesia record  Specimens Removed: 1 to pathology   Sponge and Instrument Count Correct? yes   PACU: portable radiograph - low AP   Plan/RTC: Return in 2 weeks for staple removal. Weight Bearing/Load Lower Extremity: full  Hip precautions: none Suture Removal: 2 weeks   N. Claria Crofts, MD [facility] 10:00 AM   Implant Name Type Inv. Item Serial No. Manufacturer Lot No. LRB No. Used Action  PIN SECTOR W/GRIP ACE CUP - [id number] Hips PIN SECTOR W/GRIP ACE CUP  [employer] [id number] Left 1 Implanted  LINER NEUTRAL 52X36MM PLUS 4 - [id number] Liner LINER NEUTRAL 52X36MM PLUS 4  [employer] [id number] Left 1 Implanted  SCREW 6.5MMX25MM - [id number] Screw SCREW 6.5MMX25MM  [employer]PU[employer] [id number] Left 1 Implanted  STEM FEM ACTIS STD SZ4 - [id number] Stem STEM FEM ACTIS STD SZ4  [employer]PU[employer] [id number] Left 1 Implanted  HEAD CERAMIC DELTA 36 PLUS 1.5 - [id number] Hips HEAD CERAMIC DELTA 36 PLUS 1.5  [employer] [id number] Left 1 Implanted

## 2023-11-04 NOTE — Transfer of Care (Signed)
 Immediate Anesthesia Transfer of Care Note  Patient: [name] [name]  Procedure(s) Performed: ARTHROPLASTY, HIP, TOTAL, ANTERIOR APPROACH (Left: Hip)  Patient Location: PACU  Anesthesia Type:MAC and Spinal  Level of Consciousness: awake  Airway & Oxygen Therapy: Patient Spontanous Breathing  Post-op Assessment: Report given to RN and Post -op Vital signs reviewed and stable  Post vital signs: Reviewed and stable  Last Vitals:  Vitals Value Taken Time  BP 125/72 11/04/23 1033  Temp 36.4 C 11/04/23 1033  Pulse 74 11/04/23 1039  Resp 18 11/04/23 1039  SpO2 98 % 11/04/23 1039  Vitals shown include unfiled device data.  Last Pain:  Vitals:   11/04/23 1033  TempSrc:   PainSc: 0-No pain      Patients Stated Pain Goal: 1 (11/04/23 0703)  Complications: No notable events documented.

## 2023-11-04 NOTE — Discharge Summary (Signed)
 Patient ID: [name] [name] MRN: [id number] DOB/AGE: 02/28/1981 43 y.o.  Admit date: 11/04/2023 Discharge date: 11/04/2023  Admission Diagnoses:  Principal Problem:   Avascular necrosis of left femoral head [facility])   Discharge Diagnoses:  Same  Past Medical History:  Diagnosis Date   ADHD (attention deficit hyperactivity disorder)    Arthritis    Depression    Ectopic pregnancy    History of kidney stones    Hypertension     Surgeries: Procedure(s): ARTHROPLASTY, HIP, TOTAL, ANTERIOR APPROACH on 11/04/2023   Consultants:   Discharged Condition: Improved  Hospital Course: [name] [name] is an 43 y.o. female who was admitted 11/04/2023 for operative treatment ofAvascular necrosis of left femoral head (HCC). Patient has severe unremitting pain that affects sleep, daily activities, and work/hobbies. After pre-op clearance the patient was taken to the operating room on 11/04/2023 and underwent  Procedure(s): ARTHROPLASTY, HIP, TOTAL, ANTERIOR APPROACH.    Patient was given perioperative antibiotics:  Anti-infectives (From admission, onward)    Start     Dose/Rate Route Frequency Ordered Stop   11/04/23 0926  vancomycin  (VANCOCIN ) powder          As needed 11/04/23 0926     11/04/23 0645  ceFAZolin  (ANCEF ) IVPB 2g/100 mL premix        2 g 200 mL/hr over 30 Minutes Intravenous On call to O.R. 11/04/23 [id number] 11/04/23 0843        Patient was given sequential compression devices, early ambulation, and chemoprophylaxis to prevent DVT.  Patient benefited maximally from hospital stay and there were no complications.    Recent vital signs: Patient Vitals for the past 24 hrs:  BP Temp Temp src Pulse Resp SpO2 Height Weight  11/04/23 0647 132/81 98.3 F (36.8 C) Oral 82 18 100 % 5\' 3"  (1.6 m) 86.2 kg     Recent laboratory studies: No results for input(s): "WBC", "HGB", "HCT", "PLT", "NA", "K", "CL", "CO2", "BUN", "CREATININE", "GLUCOSE", "INR", "CALCIUM" in the last 72  hours.  Invalid input(s): "PT", "2"   Discharge Medications:   Allergies as of 11/04/2023       Reactions   Toradol  [ketorolac  Tromethamine ] Nausea And Vomiting        Medication List     STOP taking these medications    acetaminophen  500 MG tablet Commonly known as: TYLENOL    acetaminophen -codeine  300-30 MG tablet Commonly known as: TYLENOL  #3   HYDROcodone -acetaminophen  5-325 MG tablet Commonly known as: NORCO/VICODIN   meloxicam  15 MG tablet Commonly known as: MOBIC    naproxen  sodium 220 MG tablet Commonly known as: ALEVE    traMADol  50 MG tablet Commonly known as: ULTRAM        TAKE these medications    amLODipine-benazepril 10-20 MG capsule Commonly known as: LOTREL Take 1 capsule by mouth daily.   aspirin  81 MG chewable tablet Commonly known as: Aspirin  81 Chew 1 tablet (81 mg total) by mouth 2 (two) times daily. To be taken after surgery to prevent blood clots   buPROPion  300 MG 24 hr tablet Commonly known as: WELLBUTRIN  XL Take 300 mg by mouth daily.   chlorhexidine  4 % external liquid Commonly known as: HIBICLENS  Apply 15 mLs (1 Application total) topically as directed for 30 doses. Use as directed daily for 5 days every other week for 6 weeks.   docusate sodium  100 MG capsule Commonly known as: Colace Take 1 capsule (100 mg total) by mouth daily as needed.   metFORMIN 500 MG 24 hr tablet Commonly known  as: GLUCOPHAGE-XR Take 500 mg by mouth 2 (two) times daily.   methocarbamol  750 MG tablet Commonly known as: Robaxin -750 Take 1 tablet (750 mg total) by mouth 3 (three) times daily as needed for muscle spasms.   methylphenidate  36 MG CR tablet Commonly known as: CONCERTA  Take 36 mg by mouth daily.   mupirocin  ointment 2 % Commonly known as: BACTROBAN  Place 1 Application into the nose 2 (two) times daily for 60 doses. Use as directed 2 times daily for 5 days every other week for 6 weeks.   ondansetron  4 MG tablet Commonly known as:  Zofran  Take 1 tablet (4 mg total) by mouth every 8 (eight) hours as needed. What changed: Another medication with the same name was removed. Continue taking this medication, and follow the directions you see here.   oxyCODONE -acetaminophen  5-325 MG tablet Commonly known as: Percocet Take 1-2 tablets by mouth every 6 (six) hours as needed. To be taken after surgery   topiramate  25 MG tablet Commonly known as: TOPAMAX  Take 50 mg by mouth at bedtime. For weight loss        Diagnostic Studies: No results found.  Disposition:      Follow-up Information     Sandie Cross, PA-C. Schedule an appointment as soon as possible for a visit in 2 week(s).   Specialty: Orthopedic Surgery Contact information: [address] [place] [address] [phone]                  Signed: Sandie Cross 11/04/2023, 10:28 AM

## 2023-11-04 NOTE — H&P (Signed)
 PREOPERATIVE H&P  Chief Complaint: LEFT HIP AVASCULAR NECROSIS  HPI: [name] [name] is a 43 y.o. female who presents for surgical treatment of LEFT HIP AVASCULAR NECROSIS.  She denies any changes in medical history.  Past Surgical History:  Procedure Laterality Date  . TOTAL HIP ARTHROPLASTY Right 08/31/2019   Procedure: RIGHT TOTAL HIP ARTHROPLASTY ANTERIOR APPROACH;  Surgeon: Wes Hamman, MD;  Location: [facility] OR;  Service: Orthopedics;  Laterality: Right;  . WISDOM TOOTH EXTRACTION     Social History   Socioeconomic History  . Marital status: Divorced    Spouse name: Yeilyn Gent  . Number of children: 0  . Years of education: 44  . Highest education level: Not on file  Occupational History  . Occupation: Office manager    Comment: [employer]  . Occupation: blogger    Comment: beer blog for [employer]  Tobacco Use  . Smoking status: Former    Current packs/day: 0.00    Average packs/day: 1 pack/day for 23.0 years (23.0 ttl pk-yrs)    Types: Cigarettes    Start date: 05/28/1994    Quit date: 05/28/2017    Years since quitting: 6.4  . Smokeless tobacco: Never  Vaping Use  . Vaping status: Never Used  Substance and Sexual Activity  . Alcohol use: Yes    Comment: 2 drinks per day  . Drug use: No  . Sexual activity: Yes    Partners: Male    Birth control/protection: None  Other Topics Concern  . Not on file  Social History Narrative   Lives with her husband.   Mother and grandfather live nearby.   Social Drivers of Corporate investment banker Strain: Not on file  Food Insecurity: No Food Insecurity (02/07/2022)   [facility] visits prior to [facility]/18/2022[facility]   Hunger Vital Sign   . Worried About Programme researcher, broadcasting/film/video in the Last Year: Never true   . Ran Out of Food in the Last Year: Never true  Transportation Needs: No Transportation  Needs (02/07/2022)   [facility] visits prior to 08/18/2022[facility], [facility] prior to 08/18/2022., [facility]   PRAPARE - Transportation   . Lack of Transportation (Medical): No   . Lack of Transportation (Non-Medical): No  Physical Activity: Not on file  Stress: Not on file  Social Connections: Not on file   Family History  Problem Relation Age of Onset  . Hypertension Father   . Heart disease Father   . Healthy Mother    Allergies  Allergen Reactions  . Toradol  [Ketorolac  Tromethamine ] Nausea And Vomiting   Prior to Admission medications   Medication Sig Start Date End Date Taking? Authorizing Provider  acetaminophen  (TYLENOL ) 500 MG tablet Take 500 mg by mouth every 6 (six) hours as needed for moderate pain (pain score 4-6).   Yes [provider]  acetaminophen -codeine  (TYLENOL  #3) 300-30 MG tablet Take 1 tablet by mouth 3 (three) times daily as needed. 10/24/23  Yes Sandie Cross, PA-C  amLODipine-benazepril (LOTREL) 10-20 MG capsule Take 1 capsule by mouth daily.   Yes [provider]  aspirin  (ASPIRIN  81) 81 MG chewable tablet Chew 1 tablet (81 mg total) by mouth 2 (two) times daily. To be taken after surgery to prevent blood clots Patient not taking: Reported on 10/28/2023 10/23/23  Sandie Cross, PA-C  buPROPion  (WELLBUTRIN  XL) 300 MG 24 hr tablet Take 300 mg by mouth daily.   Yes [provider]  docusate sodium  (COLACE) 100 MG capsule Take 1 capsule (100 mg total) by mouth daily as needed. Patient not taking: Reported on 10/28/2023 10/23/23 10/22/24  Sandie Cross, PA-C  HYDROcodone -acetaminophen  (NORCO/VICODIN) 5-325 MG tablet Take 1 tablet by mouth daily as needed for moderate pain (pain score 4-6). 10/29/23   Wes Hamman, MD  meloxicam  (MOBIC ) 15 MG tablet Take 15 mg by mouth daily as needed for pain.   Yes [provider]  metFORMIN (GLUCOPHAGE-XR) 500 MG 24 hr  tablet Take 500 mg by mouth 2 (two) times daily. 10/10/23  Yes [provider]  methocarbamol  (ROBAXIN -750) 750 MG tablet Take 1 tablet (750 mg total) by mouth 3 (three) times daily as needed for muscle spasms. Patient not taking: Reported on 10/28/2023 10/23/23   Sandie Cross, PA-C  methylphenidate  36 MG PO CR tablet Take 36 mg by mouth daily. 10/15/23  Yes [provider]  naproxen  sodium (ALEVE ) 220 MG tablet Take 220-440 mg by mouth 2 (two) times daily as needed (Pain).   Yes [provider]  ondansetron  (ZOFRAN ) 4 MG tablet Take 4 mg by mouth every 8 (eight) hours as needed for nausea or vomiting.   Yes [provider]  ondansetron  (ZOFRAN ) 4 MG tablet Take 1 tablet (4 mg total) by mouth every 8 (eight) hours as needed. Patient not taking: Reported on 10/28/2023 10/23/23   Sandie Cross, PA-C  oxyCODONE -acetaminophen  (PERCOCET) 5-325 MG tablet Take 1-2 tablets by mouth every 6 (six) hours as needed. To be taken after surgery Patient not taking: Reported on 10/28/2023 10/23/23   Sandie Cross, PA-C  topiramate  (TOPAMAX ) 25 MG tablet Take 50 mg by mouth at bedtime. For weight loss   Yes [provider]  traMADol  (ULTRAM ) 50 MG tablet Take 50 mg by mouth 2 (two) times daily as needed for moderate pain (pain score 4-6). Patient not taking: Reported on 10/28/2023 09/11/23   [provider]     Positive ROS: All other systems have been reviewed and were otherwise negative with the exception of those mentioned in the HPI and as above.  Physical Exam: General: Alert, no acute distress Cardiovascular: No pedal edema Respiratory: No cyanosis, no use of accessory musculature GI: abdomen soft Skin: No lesions in the area of chief complaint Neurologic: Sensation intact distally Psychiatric: Patient is competent for consent with normal mood and affect Lymphatic: no lymphedema  MUSCULOSKELETAL: exam stable  Assessment: LEFT HIP AVASCULAR  NECROSIS  Plan: Plan for Procedure(s): ARTHROPLASTY, HIP, TOTAL, ANTERIOR APPROACH  The risks benefits and alternatives were discussed with the patient including but not limited to the risks of nonoperative treatment, versus surgical intervention including infection, bleeding, nerve injury,  blood clots, cardiopulmonary complications, morbidity, mortality, among others, and they were willing to proceed.   Claria Crofts, MD 11/04/2023 6:34 AM

## 2023-11-04 NOTE — Anesthesia Preprocedure Evaluation (Signed)
 Anesthesia Evaluation  Patient identified by MRN, date of birth, ID band Patient awake    Reviewed: Allergy & Precautions, NPO status , Patient's Chart, lab work & pertinent test results  History of Anesthesia Complications Negative for: history of anesthetic complications  Airway Mallampati: II  TM Distance: >3 FB Neck ROM: Full    Dental  (+) Teeth Intact, Dental Advisory Given   Pulmonary neg shortness of breath, neg sleep apnea, neg COPD, neg recent URI, former smoker   breath sounds clear to auscultation       Cardiovascular hypertension, Pt. on medications (-) angina (-) Past MI and (-) CHF  Rhythm:Regular     Neuro/Psych  PSYCHIATRIC DISORDERS  Depression    negative neurological ROS     GI/Hepatic negative GI ROS, Neg liver ROS,,,  Endo/Other  negative endocrine ROS    Renal/GU negative Renal ROSLab Results      Component                Value               Date                      NA                       139                 10/28/2023                K                        4.6                 10/28/2023                CO2                      25                  10/28/2023                GLUCOSE                  108 (H)             10/28/2023                BUN                      11                  10/28/2023                CREATININE               0.82                10/28/2023                CALCIUM                  8.9                 10/28/2023                GFRNONAA                 >60  10/28/2023                Musculoskeletal  (+) Arthritis ,    Abdominal   Peds  Hematology  (+) Blood dyscrasia, anemia Lab Results      Component                Value               Date                      WBC                      10.3                10/28/2023                HGB                      11.2 (L)            10/28/2023                HCT                      36.2                 10/28/2023                MCV                      98.1                10/28/2023                PLT                      466 (H)             10/28/2023              Anesthesia Other Findings   Reproductive/Obstetrics                              Anesthesia Physical Anesthesia Plan  ASA: 2  Anesthesia Plan: MAC and Spinal   Post-op Pain Management:    Induction: Intravenous  PONV Risk Score and Plan: Ondansetron , Dexamethasone  and Propofol  infusion  Airway Management Planned: Nasal Cannula, Natural Airway and Simple Face Mask  Additional Equipment: None  Intra-op Plan:   Post-operative Plan:   Informed Consent: I have reviewed the patients History and Physical, chart, labs and discussed the procedure including the risks, benefits and alternatives for the proposed anesthesia with the patient or authorized representative who has indicated his/her understanding and acceptance.     Dental advisory given  Plan Discussed with: CRNA  Anesthesia Plan Comments:          Anesthesia Quick Evaluation

## 2023-11-05 ENCOUNTER — Encounter: Payer: Self-pay | Admitting: Orthopaedic Surgery

## 2023-11-05 ENCOUNTER — Encounter (HOSPITAL_COMMUNITY): Payer: Self-pay | Admitting: Orthopaedic Surgery

## 2023-11-06 ENCOUNTER — Encounter (HOSPITAL_COMMUNITY): Payer: Self-pay | Admitting: Orthopaedic Surgery

## 2023-11-06 NOTE — Anesthesia Postprocedure Evaluation (Signed)
 Anesthesia Post Note  Patient: [name] [name]  Procedure(s) Performed: ARTHROPLASTY, HIP, TOTAL, ANTERIOR APPROACH (Left: Hip)     Patient location during evaluation: PACU Anesthesia Type: MAC and Spinal Level of consciousness: awake and alert Pain management: pain level controlled Vital Signs Assessment: post-procedure vital signs reviewed and stable Respiratory status: spontaneous breathing, nonlabored ventilation and respiratory function stable Cardiovascular status: stable and blood pressure returned to baseline Postop Assessment: no apparent nausea or vomiting Anesthetic complications: no   No notable events documented.               Floris Neuhaus

## 2023-11-08 ENCOUNTER — Encounter: Payer: Self-pay | Admitting: Orthopaedic Surgery

## 2023-11-08 ENCOUNTER — Other Ambulatory Visit: Payer: Self-pay | Admitting: Physician Assistant

## 2023-11-08 MED ORDER — OXYCODONE-ACETAMINOPHEN 5-325 MG PO TABS
1.0000 | ORAL_TABLET | Freq: Three times a day (TID) | ORAL | 0 refills | Status: DC | PRN
Start: 2023-11-08 — End: 2023-11-15

## 2023-11-08 NOTE — Telephone Encounter (Signed)
 Sent!

## 2023-11-10 ENCOUNTER — Other Ambulatory Visit: Payer: Self-pay | Admitting: Physician Assistant

## 2023-11-11 ENCOUNTER — Encounter: Payer: Self-pay | Admitting: Orthopaedic Surgery

## 2023-11-12 NOTE — Telephone Encounter (Signed)
 Please order ultrasound to rule out DVT

## 2023-11-12 NOTE — Telephone Encounter (Signed)
 Muscle relaxers, ice and heat.  If any of this pain is in the back of her leg, esp in calf, lets get an ultrasound to r/o dvt

## 2023-11-12 NOTE — Telephone Encounter (Signed)
 Assuming she is able to get this from original pharmacy at this point?

## 2023-11-13 ENCOUNTER — Other Ambulatory Visit: Payer: Self-pay

## 2023-11-13 ENCOUNTER — Ambulatory Visit (HOSPITAL_COMMUNITY)
Admission: RE | Admit: 2023-11-13 | Discharge: 2023-11-13 | Disposition: A | Discharge status: Home / Self Care | Source: Ambulatory Visit | Attending: Orthopaedic Surgery | Admitting: Orthopaedic Surgery

## 2023-11-13 DIAGNOSIS — M25552 Pain in left hip: Secondary | ICD-10-CM | POA: Diagnosis present

## 2023-11-13 NOTE — Telephone Encounter (Signed)
 Lets get an ultrasound to r/o dvt.    Also recommend compression socks, rest, ice etc.  If both legs, I would have her reach out to pcp as well

## 2023-11-14 ENCOUNTER — Telehealth: Payer: Self-pay

## 2023-11-14 ENCOUNTER — Encounter: Payer: Self-pay | Admitting: Orthopaedic Surgery

## 2023-11-14 NOTE — Telephone Encounter (Signed)
 I have tried calling patient twice. No answer. LMOM. Dr.Duda is able to see patient today at 4:15pm to assess leg. Ask patient to call me back as soon as possible so that we can have her seen.

## 2023-11-15 ENCOUNTER — Other Ambulatory Visit: Payer: Self-pay | Admitting: Orthopedic Surgery

## 2023-11-15 MED ORDER — OXYCODONE-ACETAMINOPHEN 5-325 MG PO TABS: 1.0000 | ORAL_TABLET | Freq: Three times a day (TID) | ORAL | 0 refills | Status: DC | PRN

## 2023-11-19 ENCOUNTER — Encounter: Payer: Self-pay | Admitting: Physician Assistant

## 2023-11-19 ENCOUNTER — Ambulatory Visit (INDEPENDENT_AMBULATORY_CARE_PROVIDER_SITE_OTHER): Admitting: Physician Assistant

## 2023-11-19 DIAGNOSIS — Z96642 Presence of left artificial hip joint: Secondary | ICD-10-CM

## 2023-11-19 MED ORDER — METHOCARBAMOL 750 MG PO TABS: 750.0000 mg | ORAL_TABLET | Freq: Three times a day (TID) | ORAL | 2 refills | Status: DC | PRN

## 2023-11-19 MED ORDER — HYDROCODONE-ACETAMINOPHEN 5-325 MG PO TABS: 1.0000 | ORAL_TABLET | Freq: Three times a day (TID) | ORAL | 0 refills | Status: DC | PRN

## 2023-11-19 NOTE — Progress Notes (Signed)
 Post-Op Visit Note   Patient: [name] [name]           Date of Birth: March 12, 1981           MRN: [id number] Visit Date: 11/19/2023 PCP: Mitchell Ana, PA-C   Assessment & Plan:  Chief Complaint:  Chief Complaint  Patient presents with   Left Hip - Follow-up    Left total hip arthroplasty 11/04/2023   Visit Diagnoses:  1. Status post total replacement of left hip     Plan: Patient is a 43 year old female comes in today 2 weeks status post left total hip replacement 11/04/2023.  She has been doing much better.  She initially had a fair amount of pain and swelling and ultrasound of the left lower extremity was obtained.  This was negative for DVT.  She has been taking Norco and Robaxin  for pain.  She has been taking aspirin  twice daily for DVT prophylaxis.  Examination of the left hip reveals a well-healing surgical incision with nylon sutures in place.  No evidence of infection or cellulitis.  Calves are soft nontender.  She does have moderate swelling throughout the entire left lower extremity.  She is neurovascularly intact distally.  Today, sutures were removed and Steri-Strips applied.  I refilled her Norco and Robaxin .  Postoperative instructions provided.  She will continue with her baby aspirin  twice daily for another 4 weeks.  Follow-up in 4 weeks for repeat evaluation and AP pelvis x-rays.  Call with concerns or questions.  Follow-Up Instructions: Return in about 4 weeks (around 12/17/2023).   Orders:  No orders of the defined types were placed in this encounter.  No orders of the defined types were placed in this encounter.   Imaging: No new imaging  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   Status post total replacement of right hip 08/31/2019   Avascular necrosis of left femoral head (HCC) 08/05/2019   Elevated blood pressure reading in office without diagnosis of hypertension  08/05/2019   Attention deficit hyperactivity disorder (ADHD) 06/22/2015   Past Medical History:  Diagnosis Date   ADHD (attention deficit hyperactivity disorder)    Arthritis    Depression    Ectopic pregnancy    History of kidney stones    Hypertension     Family History  Problem Relation Age of Onset   Hypertension Father    Heart disease Father    Healthy Mother     Past Surgical History:  Procedure Laterality Date   TOTAL HIP ARTHROPLASTY Right 08/31/2019   Procedure: RIGHT TOTAL HIP ARTHROPLASTY ANTERIOR APPROACH;  Surgeon: Wes Hamman, MD;  Location: [facility] OR;  Service: Orthopedics;  Laterality: Right;   TOTAL HIP ARTHROPLASTY Left 11/04/2023   Procedure: ARTHROPLASTY, HIP, TOTAL, ANTERIOR APPROACH;  Surgeon: Wes Hamman, MD;  Location: [facility] OR;  Service: Orthopedics;  Laterality: Left;   WISDOM TOOTH EXTRACTION     Social History   Occupational History   Occupation: Office manager    Comment: [employer]   Occupation: blogger    Comment: beer blog for [employer]  Tobacco Use   Smoking status: Former    Current packs/day: 0.00    Average packs/day: 1 pack/day for 23.0 years (23.0 ttl pk-yrs)    Types: Cigarettes    Start date: 05/28/1994    Quit date: 05/28/2017    Years since quitting: 6.4   Smokeless tobacco:  Never  Vaping Use   Vaping status: Never Used  Substance and Sexual Activity   Alcohol use: Yes    Comment: 2 drinks per day   Drug use: No   Sexual activity: Yes    Partners: Male    Birth control/protection: None

## 2023-11-27 ENCOUNTER — Other Ambulatory Visit: Payer: Self-pay | Admitting: Physician Assistant

## 2023-11-27 ENCOUNTER — Encounter: Payer: Self-pay | Admitting: Orthopaedic Surgery

## 2023-11-27 MED ORDER — METHOCARBAMOL 750 MG PO TABS: 750.0000 mg | ORAL_TABLET | Freq: Two times a day (BID) | ORAL | 1 refills | Status: DC | PRN

## 2023-11-27 MED ORDER — HYDROCODONE-ACETAMINOPHEN 5-325 MG PO TABS: 1.0000 | ORAL_TABLET | Freq: Two times a day (BID) | ORAL | 0 refills | Status: DC | PRN

## 2023-11-27 NOTE — Telephone Encounter (Signed)
 sent

## 2023-12-06 ENCOUNTER — Other Ambulatory Visit: Payer: Self-pay | Admitting: Physician Assistant

## 2023-12-06 ENCOUNTER — Encounter: Payer: Self-pay | Admitting: Orthopaedic Surgery

## 2023-12-06 MED ORDER — HYDROCODONE-ACETAMINOPHEN 5-325 MG PO TABS: 1.0000 | ORAL_TABLET | Freq: Two times a day (BID) | ORAL | 0 refills | Status: DC | PRN

## 2023-12-06 MED ORDER — METHOCARBAMOL 750 MG PO TABS: 750.0000 mg | ORAL_TABLET | Freq: Two times a day (BID) | ORAL | 1 refills | Status: DC | PRN

## 2023-12-06 NOTE — Telephone Encounter (Signed)
 sent

## 2023-12-17 ENCOUNTER — Other Ambulatory Visit (INDEPENDENT_AMBULATORY_CARE_PROVIDER_SITE_OTHER): Payer: Self-pay

## 2023-12-17 ENCOUNTER — Ambulatory Visit (INDEPENDENT_AMBULATORY_CARE_PROVIDER_SITE_OTHER): Admitting: Physician Assistant

## 2023-12-17 ENCOUNTER — Other Ambulatory Visit: Payer: Self-pay | Admitting: Physician Assistant

## 2023-12-17 DIAGNOSIS — Z96642 Presence of left artificial hip joint: Secondary | ICD-10-CM | POA: Diagnosis not present

## 2023-12-17 MED ORDER — METHOCARBAMOL 750 MG PO TABS: 750.0000 mg | ORAL_TABLET | Freq: Three times a day (TID) | ORAL | 2 refills | Status: DC | PRN

## 2023-12-17 MED ORDER — HYDROCODONE-ACETAMINOPHEN 5-325 MG PO TABS: 1.0000 | ORAL_TABLET | Freq: Two times a day (BID) | ORAL | 0 refills | Status: DC | PRN

## 2023-12-17 NOTE — Progress Notes (Addendum)
 Post-Op Visit Note   Patient: [name] [name]           Date of Birth: 10-20-1980           MRN: [id number] Visit Date: 12/17/2023 PCP: Loris Elsie PARAS, PA-C   Assessment & Plan:  Chief Complaint:  Chief Complaint  Patient presents with   Left Hip - Follow-up    Left THA 11/04/2023   Visit Diagnoses:  1. Status post total replacement of left hip     Plan: Patient is a pleasant 43 year old female who comes in today 6 weeks status post left total hip replacement 11/04/2023.  She has been doing well.  She has been doing much better.  She has been taking aspirin  for DVT prophylaxis.  She is taking occasional Norco and Robaxin  for pain.  Of the left hip: She does have a few scabs overlying the incision.  No evidence of infection or cellulitis.  Painless hip flexion logroll.  She is neurovascularly intact distally.  At this point, she may discontinue taking her baby aspirin  for DVT prophylaxis.  Continue to advance with activity as tolerated.  Dental prophylaxis reinforced.  Follow-up in 6 weeks for recheck.  Follow-Up Instructions: Return in about 6 weeks (around 01/28/2024).   Orders:  Orders Placed This Encounter  Procedures   XR Pelvis 1-2 Views   No orders of the defined types were placed in this encounter.   Imaging: XR Pelvis 1-2 Views Result Date: 12/17/2023 Well-seated prosthesis without complication   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   Status post total replacement of right hip 08/31/2019   Avascular necrosis of left femoral head (HCC) 08/05/2019   Elevated blood pressure reading in office without diagnosis of hypertension 08/05/2019   Attention deficit hyperactivity disorder (ADHD) 06/22/2015   Past Medical History:  Diagnosis Date   ADHD (attention deficit hyperactivity disorder)    Arthritis    Depression    Ectopic pregnancy    History of kidney stones     Hypertension     Family History  Problem Relation Age of Onset   Hypertension Father    Heart disease Father    Healthy Mother     Past Surgical History:  Procedure Laterality Date   TOTAL HIP ARTHROPLASTY Right 08/31/2019   Procedure: RIGHT TOTAL HIP ARTHROPLASTY ANTERIOR APPROACH;  Surgeon: Jerri Kay HERO, MD;  Location: [facility] OR;  Service: Orthopedics;  Laterality: Right;   TOTAL HIP ARTHROPLASTY Left 11/04/2023   Procedure: ARTHROPLASTY, HIP, TOTAL, ANTERIOR APPROACH;  Surgeon: Jerri Kay HERO, MD;  Location: [facility] OR;  Service: Orthopedics;  Laterality: Left;   WISDOM TOOTH EXTRACTION     Social History   Occupational History   Occupation: Office manager    Comment: [employer]   Occupation: blogger    Comment: beer blog for [employer]  Tobacco Use   Smoking status: Former    Current packs/day: 0.00    Average packs/day: 1 pack/day for 23.0 years (23.0 ttl pk-yrs)    Types: Cigarettes    Start date: 05/28/1994    Quit date: 05/28/2017    Years since quitting: 6.5   Smokeless tobacco: Never  Vaping Use   Vaping status: Never Used  Substance and Sexual Activity   Alcohol use: Yes    Comment: 2 drinks per day   Drug use: No   Sexual activity: Yes    Partners: Male  Birth control/protection: None

## 2023-12-24 ENCOUNTER — Encounter: Payer: Self-pay | Admitting: Orthopaedic Surgery

## 2023-12-27 ENCOUNTER — Encounter: Payer: Self-pay | Admitting: Orthopaedic Surgery

## 2023-12-27 ENCOUNTER — Other Ambulatory Visit: Payer: Self-pay | Admitting: Orthopaedic Surgery

## 2023-12-27 MED ORDER — TRAMADOL HCL 50 MG PO TABS: 50.0000 mg | ORAL_TABLET | Freq: Every day | ORAL | 0 refills | Status: DC | PRN

## 2023-12-27 MED ORDER — METHOCARBAMOL 750 MG PO TABS: 750.0000 mg | ORAL_TABLET | Freq: Two times a day (BID) | ORAL | 1 refills | Status: DC | PRN

## 2024-01-07 ENCOUNTER — Other Ambulatory Visit (INDEPENDENT_AMBULATORY_CARE_PROVIDER_SITE_OTHER): Payer: Self-pay

## 2024-01-07 ENCOUNTER — Ambulatory Visit: Admitting: Physician Assistant

## 2024-01-07 DIAGNOSIS — M25561 Pain in right knee: Secondary | ICD-10-CM

## 2024-01-07 DIAGNOSIS — G8929 Other chronic pain: Secondary | ICD-10-CM | POA: Insufficient documentation

## 2024-01-07 DIAGNOSIS — Z96642 Presence of left artificial hip joint: Secondary | ICD-10-CM

## 2024-01-07 MED ORDER — DOXYCYCLINE HYCLATE 100 MG PO TABS: 100.0000 mg | ORAL_TABLET | Freq: Two times a day (BID) | ORAL | 0 refills | Status: DC

## 2024-01-07 MED ORDER — TRAMADOL HCL 50 MG PO TABS: 50.0000 mg | ORAL_TABLET | Freq: Two times a day (BID) | ORAL | 1 refills | Status: DC | PRN

## 2024-01-07 NOTE — Progress Notes (Addendum)
 Office Visit Note   Patient: [name] [name]           Date of Birth: 25-Jan-1981           MRN: [id number] Visit Date: 01/07/2024              Requested by: Loris Elsie PARAS, PA-C [address] [place],  [place] [address] PCP: Loris Elsie PARAS, PA-C   Assessment & Plan: Visit Diagnoses:  1. Acute pain of right knee   2. Status post total replacement of left hip     Plan: Impression is right knee arthritis flare.  Today, we discussed various treatment options to include intra-articular cortisone injection.  I would like to hold off on this for now due to the 2 wounds to her left hip.  Once her left hip wounds have cleared she may follow-up for a right knee cortisone injection.  Due to multiple joint pain and findings of arthritis, we have discussed obtaining an arthritis panel today.  I will call her with the results.  Regards to the left hip, we have applied mupirocin  and a Band-Aid which she will do on her own twice daily.  I started on doxycycline .  She will follow-up in 2 weeks for recheck.  If she has any worsening symptoms or concerns she will call us  in the meantime.  Follow-Up Instructions: Return in about 2 weeks (around 01/21/2024).   Orders:  Orders Placed This Encounter  Procedures   XR KNEE 3 VIEW RIGHT   Meds ordered this encounter  Medications   doxycycline  (VIBRA -TABS) 100 MG tablet    Sig: Take 1 tablet (100 mg total) by mouth 2 (two) times daily.    Dispense:  20 tablet    Refill:  0   traMADol  (ULTRAM ) 50 MG tablet    Sig: Take 1-2 tablets (50-100 mg total) by mouth 2 (two) times daily as needed.    Dispense:  30 tablet    Refill:  1      Procedures: No procedures performed   Clinical Data: No additional findings.   Subjective: Chief Complaint  Patient presents with   Right Knee - Pain    HPI patient is a pleasant 43 year old female who comes in today with right knee pain.  Symptoms have recently started and are primarily located in the  anterior and lateral aspects of the knee.  She has associated stiffness as well as pain when going from a seated to standing position.  She has been taking tramadol  for her pain without significant relief.  No previous cortisone injections the right knee.  She did see us  earlier in the year with left knee pain and underwent left knee cortisone injection which has significantly helped.  Of note, she is status post left total hip replacement 11/04/2023.  She has been doing well without any pain but has noticed that 2 of her scabs have recently fallen off and is concerned about her wound.  No fevers or chills or any other systemic symptoms.  Review of Systems as detailed in HPI.  All others reviewed and are negative.   Objective: Vital Signs: There were no vitals taken for this visit.  Physical Exam well-developed well-nourished female in no acute distress.  Alert and oriented x 3.  Ortho Exam right knee exam: Mild tenderness to the lateral joint line.  Mild to moderate patellofemoral crepitus.  Range of motion 0 to 120 degrees.  She is neurovascular intact distally.  Left hip  exam: Well-healed surgical incision with the exception of 2 pea-sized areas towards the distal incision.  Very mild erythema.  No tenderness.  No drainage.  She is neurovascularly intact distally.  Specialty Comments:  No specialty comments available.  Imaging: XR KNEE 3 VIEW RIGHT Result Date: 01/07/2024 Mild tricompartmental degenerative changes    PMFS History: Patient Active Problem List   Diagnosis Date Noted   Acute pain of right knee 01/07/2024   Status post total replacement of left hip 08/09/2020   Status post total replacement of right hip 08/31/2019   Avascular necrosis of left femoral head (HCC) 08/05/2019   Elevated blood pressure reading in office without diagnosis of hypertension 08/05/2019   Attention deficit hyperactivity disorder (ADHD) 06/22/2015   Past Medical History:  Diagnosis Date   ADHD  (attention deficit hyperactivity disorder)    Arthritis    Depression    Ectopic pregnancy    History of kidney stones    Hypertension     Family History  Problem Relation Age of Onset   Hypertension Father    Heart disease Father    Healthy Mother     Past Surgical History:  Procedure Laterality Date   TOTAL HIP ARTHROPLASTY Right 08/31/2019   Procedure: RIGHT TOTAL HIP ARTHROPLASTY ANTERIOR APPROACH;  Surgeon: Jerri Kay HERO, MD;  Location: [facility] OR;  Service: Orthopedics;  Laterality: Right;   TOTAL HIP ARTHROPLASTY Left 11/04/2023   Procedure: ARTHROPLASTY, HIP, TOTAL, ANTERIOR APPROACH;  Surgeon: Jerri Kay HERO, MD;  Location: [facility] OR;  Service: Orthopedics;  Laterality: Left;   WISDOM TOOTH EXTRACTION     Social History   Occupational History   Occupation: Office manager    Comment: [employer]   Occupation: blogger    Comment: beer blog for [employer]  Tobacco Use   Smoking status: Former    Current packs/day: 0.00    Average packs/day: 1 pack/day for 23.0 years (23.0 ttl pk-yrs)    Types: Cigarettes    Start date: 05/28/1994    Quit date: 05/28/2017    Years since quitting: 6.6   Smokeless tobacco: Never  Vaping Use   Vaping status: Never Used  Substance and Sexual Activity   Alcohol use: Yes    Comment: 2 drinks per day   Drug use: No   Sexual activity: Yes    Partners: Male    Birth control/protection: None

## 2024-01-07 NOTE — Addendum Note (Signed)
 Addended by: JULE RONAL CROME on: 01/07/2024 03:16 PM   Modules accepted: Orders

## 2024-01-07 NOTE — Addendum Note (Signed)
 Addended by: Bosten Newstrom on: 01/07/2024 03:23 PM   Modules accepted: Orders

## 2024-01-10 LAB — ANTI-NUCLEAR AB-TITER (ANA TITER): ANA Titer 1: 1:320 {titer} — ABNORMAL HIGH

## 2024-01-10 LAB — SEDIMENTATION RATE: Sed Rate: 14 mm/h (ref 0–20)

## 2024-01-10 LAB — RHEUMATOID FACTOR: Rheumatoid fact SerPl-aCnc: <10 [IU]/mL (ref <14)

## 2024-01-10 LAB — ANA: Anti Nuclear Antibody (ANA): POSITIVE — AB

## 2024-01-10 LAB — URIC ACID: Uric Acid, Serum: 3.1 mg/dL (ref 2.5–7.0)

## 2024-01-13 ENCOUNTER — Other Ambulatory Visit: Payer: Self-pay

## 2024-01-13 ENCOUNTER — Encounter: Payer: Self-pay | Admitting: Orthopaedic Surgery

## 2024-01-13 ENCOUNTER — Ambulatory Visit: Payer: Self-pay | Admitting: Physician Assistant

## 2024-01-13 DIAGNOSIS — M25561 Pain in right knee: Secondary | ICD-10-CM

## 2024-01-13 DIAGNOSIS — R768 Other specified abnormal immunological findings in serum: Secondary | ICD-10-CM

## 2024-01-13 NOTE — Progress Notes (Signed)
Done   Referral made 

## 2024-01-13 NOTE — Progress Notes (Signed)
 Can you let her know arthritis panel has positive results which could indicate autoimmune disease, but will need to refer to rheumatolgy to further work-up.    Can you place referral for rheumatology

## 2024-01-16 ENCOUNTER — Other Ambulatory Visit: Payer: Self-pay

## 2024-01-16 DIAGNOSIS — R768 Other specified abnormal immunological findings in serum: Secondary | ICD-10-CM

## 2024-01-16 NOTE — Telephone Encounter (Signed)
 Yes she can.

## 2024-01-20 NOTE — Telephone Encounter (Signed)
 Submitted referral thru [facility] to [facility] rheumatology

## 2024-01-21 ENCOUNTER — Ambulatory Visit (INDEPENDENT_AMBULATORY_CARE_PROVIDER_SITE_OTHER): Admitting: Physician Assistant

## 2024-01-21 DIAGNOSIS — M1711 Unilateral primary osteoarthritis, right knee: Secondary | ICD-10-CM

## 2024-01-21 DIAGNOSIS — S71002D Unspecified open wound, left hip, subsequent encounter: Secondary | ICD-10-CM

## 2024-01-21 MED ORDER — BUPIVACAINE HCL 0.25 % IJ SOLN
2.0000 mL | INTRAMUSCULAR | Status: AC | PRN
  Administered 2024-01-21: 2 mL via INTRA_ARTICULAR

## 2024-01-21 MED ORDER — DOXYCYCLINE HYCLATE 100 MG PO TABS: 100.0000 mg | ORAL_TABLET | Freq: Two times a day (BID) | ORAL | 0 refills | Status: DC

## 2024-01-21 MED ORDER — LIDOCAINE HCL 1 % IJ SOLN
2.0000 mL | INTRAMUSCULAR | Status: AC | PRN
Start: 2024-01-21 — End: 2024-01-21
  Administered 2024-01-21: 2 mL

## 2024-01-21 MED ORDER — METHYLPREDNISOLONE ACETATE 40 MG/ML IJ SUSP
40.0000 mg | INTRAMUSCULAR | Status: AC | PRN
Start: 2024-01-21 — End: 2024-01-21
  Administered 2024-01-21: 40 mg via INTRA_ARTICULAR

## 2024-01-21 NOTE — Progress Notes (Signed)
 Post-Op Visit Note   Patient: [name] [name]           Date of Birth: 03/19/1981           MRN: [id number] Visit Date: 01/21/2024 PCP: Loris Elsie PARAS, PA-C   Procedure Note  Patient: [name] [name]             Date of Birth: Oct 18, 1980           MRN: [id number]             Visit Date: 01/21/2024  Procedures: Visit Diagnoses:  1. Complicated open wound of left hip, subsequent encounter   2. Unilateral primary osteoarthritis, right knee     Large Joint Inj on 01/21/2024 3:32 PM Medications: 2 mL lidocaine  1 %; 2 mL bupivacaine  0.25 %; 40 mg methylPREDNISolone  acetate 40 MG/ML        Assessment & Plan:  Chief Complaint:  Chief Complaint  Patient presents with   Left Hip - Follow-up    Left total hip arthroplasty 11/04/2023   Visit Diagnoses:  1. Complicated open wound of left hip, subsequent encounter   2. Unilateral primary osteoarthritis, right knee     HP!: Patient is a pleasant 43 year old female who comes in today for follow-up of her left hip wound.  She is almost 3 months status post left total hip replacement.  I saw her a few weeks ago for 2 open areas just lateral to her incision.  I recommended mupirocin  and a Band-Aid twice a day as well as a course of doxycycline .  She finished her doxycycline  a few days ago.  She stopped using the mupirocin  a few days ago as well.  She notes improvement in symptoms.  No pain to the left hip.  She is still complaining of right knee pain.  History of osteoarthritis.  I previously told her we cannot inject the right knee until her left hip is healed.   Review of systems as detailed in HPI.  All others are negative.  Well-developed well-nourished female no acute distress.  Alert and oriented x 3.  Examination of the left hip reveals a fully healed surgical scar without complication.  She has small areas to the lateral aspect of the distal incision.  One of the areas is covered with fibrinous tissue and the other area  is scabbed over.  No signs of cellulitis or induration.  Painless hip rotation.  Unchanged right knee exam.  PLAN Impression is nearly 3 weeks status post left total hip replacement with open wound lateral to the distal aspect of the incision.  The wounds have decreased in size into the 1 proximal wound has scabbed over.  After speaking with Dr. Jerri we have recommended applying hydrogen peroxide with a Q-tip to both areas twice daily followed by mupirocin  and a Band-Aid once daily.  We have also recommended 2 additional weeks of doxycycline  and have sent this to the pharmacy.  She will follow-up with us  in 4 weeks for recheck.  She will call with any concerns or questions in the meantime.  In regards to the right knee, we have elected to proceed with right knee cortisone injection today as Dr. Jerri thinks this is okay.  Hopefully this will alleviate some of her pain in the right knee.  Follow-up as needed for her right knee.   Follow-Up Instructions: Return in about 4 weeks (around 02/18/2024).   Orders:  Orders Placed This Encounter  Procedures   Large Joint  Inj   Meds ordered this encounter  Medications   doxycycline  (VIBRA -TABS) 100 MG tablet    Sig: Take 1 tablet (100 mg total) by mouth 2 (two) times daily.    Dispense:  28 tablet    Refill:  0    Imaging: No results found.  PMFS History: Patient Active Problem List   Diagnosis Date Noted   Acute pain of right knee 01/07/2024   Status post total replacement of left hip 08/09/2020   Status post total replacement of right hip 08/31/2019   Avascular necrosis of left femoral head (HCC) 08/05/2019   Elevated blood pressure reading in office without diagnosis of hypertension 08/05/2019   Attention deficit hyperactivity disorder (ADHD) 06/22/2015   Past Medical History:  Diagnosis Date   ADHD (attention deficit hyperactivity disorder)    Arthritis    Depression    Ectopic pregnancy    History of kidney stones    Hypertension      Family History  Problem Relation Age of Onset   Hypertension Father    Heart disease Father    Healthy Mother     Past Surgical History:  Procedure Laterality Date   TOTAL HIP ARTHROPLASTY Right 08/31/2019   Procedure: RIGHT TOTAL HIP ARTHROPLASTY ANTERIOR APPROACH;  Surgeon: Jerri Kay HERO, MD;  Location: [facility] OR;  Service: Orthopedics;  Laterality: Right;   TOTAL HIP ARTHROPLASTY Left 11/04/2023   Procedure: ARTHROPLASTY, HIP, TOTAL, ANTERIOR APPROACH;  Surgeon: Jerri Kay HERO, MD;  Location: [facility];  Service: Orthopedics;  Laterality: Left;   WISDOM TOOTH EXTRACTION     Social History   Occupational History   Occupation: Office manager    Comment: [employer]   Occupation: blogger    Comment: beer blog for [employer]  Tobacco Use   Smoking status: Former    Current packs/day: 0.00    Average packs/day: 1 pack/day for 23.0 years (23.0 ttl pk-yrs)    Types: Cigarettes    Start date: 05/28/1994    Quit date: 05/28/2017    Years since quitting: 6.6   Smokeless tobacco: Never  Vaping Use   Vaping status: Never Used  Substance and Sexual Activity   Alcohol use: Yes    Comment: 2 drinks per day   Drug use: No   Sexual activity: Yes    Partners: Male    Birth control/protection: None

## 2024-01-28 ENCOUNTER — Encounter: Admitting: Physician Assistant

## 2024-01-31 ENCOUNTER — Encounter: Payer: Self-pay | Admitting: Orthopaedic Surgery

## 2024-02-05 NOTE — Telephone Encounter (Signed)
 Sure that's fine.  We can order.

## 2024-02-06 ENCOUNTER — Other Ambulatory Visit: Payer: Self-pay

## 2024-02-06 DIAGNOSIS — M1711 Unilateral primary osteoarthritis, right knee: Secondary | ICD-10-CM

## 2024-02-07 ENCOUNTER — Other Ambulatory Visit: Payer: Self-pay | Admitting: Orthopaedic Surgery

## 2024-02-07 MED ORDER — TRAMADOL HCL 50 MG PO TABS: 50.0000 mg | ORAL_TABLET | Freq: Every day | ORAL | 0 refills | Status: DC | PRN

## 2024-02-07 MED ORDER — METHOCARBAMOL 500 MG PO TABS: 500.0000 mg | ORAL_TABLET | Freq: Four times a day (QID) | ORAL | 2 refills | Status: DC | PRN

## 2024-02-08 ENCOUNTER — Other Ambulatory Visit: Payer: Self-pay | Admitting: Medical Genetics

## 2024-02-18 ENCOUNTER — Ambulatory Visit: Admitting: Orthopaedic Surgery

## 2024-02-18 ENCOUNTER — Encounter: Payer: Self-pay | Admitting: Physician Assistant

## 2024-02-18 DIAGNOSIS — M1712 Unilateral primary osteoarthritis, left knee: Secondary | ICD-10-CM

## 2024-02-18 DIAGNOSIS — Z96642 Presence of left artificial hip joint: Secondary | ICD-10-CM | POA: Diagnosis not present

## 2024-02-18 MED ORDER — BUPIVACAINE HCL 0.5 % IJ SOLN
2.0000 mL | INTRAMUSCULAR | Status: AC | PRN
  Administered 2024-02-18: 2 mL via INTRA_ARTICULAR

## 2024-02-18 MED ORDER — METHYLPREDNISOLONE ACETATE 40 MG/ML IJ SUSP
40.0000 mg | INTRAMUSCULAR | Status: AC | PRN
Start: 2024-02-18 — End: 2024-02-18
  Administered 2024-02-18: 40 mg via INTRA_ARTICULAR

## 2024-02-18 MED ORDER — LIDOCAINE HCL 1 % IJ SOLN
2.0000 mL | INTRAMUSCULAR | Status: AC | PRN
  Administered 2024-02-18: 2 mL

## 2024-02-18 NOTE — Progress Notes (Signed)
   Office Visit Note   Patient: [name] [name]           Date of Birth: 1981-03-07           MRN: [id number] Visit Date: 02/18/2024              Requested by: Loris Elsie PARAS, PA-C [address] [place],  [place] [address] PCP: Loris Elsie PARAS, PA-C   Assessment & Plan: Visit Diagnoses:  1. Primary osteoarthritis of left knee   2. Status post total replacement of left hip     Plan: From a left hip standpoint the wounds are healing well.  She should continue with hydrogen peroxide and mupirocin .  Recheck in 4 weeks.  From the left knee standpoint she has age advanced OA.  We reinjected with cortisone today.  She tolerates well.  Follow-up as needed for the knee.   Procedures: Large Joint Inj: L knee on 02/18/2024 2:14 PM Details: 22 G needle Medications: 2 mL bupivacaine  0.5 %; 2 mL lidocaine  1 %; 40 mg methylPREDNISolone  acetate 40 MG/ML Outcome: tolerated well, no immediate complications Patient was prepped and draped in the usual sterile fashion.        Subjective: Chief Complaint  Patient presents with   Left Hip - Follow-up    Left THA 11/04/2023    HPI [name] [name] is following up today for a wound check of her left hip and for left knee OA.  Ortho Exam Exam of the left hip shows the proximal wound is completely healed and the distal wound is smaller and less deep.  There is no surrounding cellulitis or drainage or signs of infection.  Exam of the left knee is unchanged from prior visit. Specialty Comments:

## 2024-02-20 ENCOUNTER — Other Ambulatory Visit: Payer: Self-pay | Admitting: Orthopaedic Surgery

## 2024-03-10 ENCOUNTER — Encounter: Payer: Self-pay | Admitting: Orthopaedic Surgery

## 2024-03-11 NOTE — Telephone Encounter (Signed)
 Sent message to April P and [facility] with central scheduling they will schedule the appt

## 2024-03-30 ENCOUNTER — Encounter: Payer: Self-pay | Admitting: Orthopaedic Surgery

## 2024-03-31 ENCOUNTER — Other Ambulatory Visit: Payer: Self-pay

## 2024-03-31 ENCOUNTER — Ambulatory Visit: Admitting: Orthopaedic Surgery

## 2024-03-31 ENCOUNTER — Encounter: Payer: Self-pay | Admitting: Orthopaedic Surgery

## 2024-03-31 DIAGNOSIS — M25561 Pain in right knee: Secondary | ICD-10-CM

## 2024-03-31 DIAGNOSIS — Z96642 Presence of left artificial hip joint: Secondary | ICD-10-CM

## 2024-03-31 DIAGNOSIS — G8929 Other chronic pain: Secondary | ICD-10-CM | POA: Diagnosis not present

## 2024-03-31 DIAGNOSIS — M25562 Pain in left knee: Secondary | ICD-10-CM

## 2024-03-31 NOTE — Progress Notes (Signed)
 Office Visit Note   Patient: [name] [name]           Date of Birth: 09/21/1980           MRN: [id number] Visit Date: 03/31/2024              Requested by: Loris Elsie PARAS, PA-C [address] [place],  [place] [address] PCP: Loris Elsie PARAS, PA-C   Assessment & Plan: Visit Diagnoses:  1. Status post total replacement of left hip   2. Chronic pain of both knees     Plan: History of Present Illness [name] [name] is a 43 year old female who presents with worsening bilateral knee pain and stiffness and postop check from a left total hip arthroplasty and wound check.  She experiences progressively worsening bilateral knee pain, particularly in the mornings, with stiffness that takes hours to improve. The pain is aching and affects her ability to travel. She is eager to have an MRI done before the end of the year due to insurance coverage. There is a family history of psoriatic arthritis, raising concerns about a similar condition affecting her knees. She has stopped consuming alcohol since August and made dietary changes. She has started exercises for osteoarthritis, focusing on strengthening the quadriceps to reduce knee stress.  She is currently established with Dr. Mai who is her rheumatologist.  She is 5 months postop from a left total hip arthroplasty and doing well in that regard.  Physical Exam SKIN: Wounds healing well. Left hip wound looks good.    Assessment and Plan Chronic bilateral knee pain and stiffness Concern for avascular necrosis. - Order MRI of the knees to evaluate for avascular necrosis or structural abnormalities.  Follow-up after the MRIs. - Advise quadriceps strengthening exercises. - Recommend supportive shoes such as Hoka's or Brooks.  Status post left hip replacement Five months post-surgery with no issues. - X-rays show well-positioned and fixed components. - Left hip wounds are healing very nicely without any signs infection or  drainage.  Follow-Up Instructions: No follow-ups on file.   Orders:  Orders Placed This Encounter  Procedures   XR Pelvis 1-2 Views   MR Knee Left w/o contrast   MR Knee Right w/o contrast   No orders of the defined types were placed in this encounter.     Procedures: No procedures performed   Clinical Data: No additional findings.   Subjective: Chief Complaint  Patient presents with   Left Hip - Follow-up    Left THA 11/04/2023   Left Knee - Pain   Right Knee - Pain    HPI  Review of Systems   Objective: Vital Signs: There were no vitals taken for this visit.  Physical Exam  Ortho Exam  Specialty Comments:  No specialty comments available.  Imaging: XR Pelvis 1-2 Views Result Date: 03/31/2024 Stable bilateral total hip replacement without complications    PMFS History: Patient Active Problem List   Diagnosis Date Noted   Chronic pain of both knees 01/07/2024   Status post total replacement of left hip 08/09/2020   Status post total replacement of right hip 08/31/2019   Avascular necrosis of left femoral head (HCC) 08/05/2019   Elevated blood pressure reading in office without diagnosis of hypertension 08/05/2019   Attention deficit hyperactivity disorder (ADHD) 06/22/2015   Past Medical History:  Diagnosis Date   ADHD (attention deficit hyperactivity disorder)    Arthritis    Depression    Ectopic pregnancy  History of kidney stones    Hypertension     Family History  Problem Relation Age of Onset   Hypertension Father    Heart disease Father    Healthy Mother     Past Surgical History:  Procedure Laterality Date   TOTAL HIP ARTHROPLASTY Right 08/31/2019   Procedure: RIGHT TOTAL HIP ARTHROPLASTY ANTERIOR APPROACH;  Surgeon: Jerri Kay HERO, MD;  Location: [facility] OR;  Service: Orthopedics;  Laterality: Right;   TOTAL HIP ARTHROPLASTY Left 11/04/2023   Procedure: ARTHROPLASTY, HIP, TOTAL, ANTERIOR APPROACH;  Surgeon: Jerri Kay HERO, MD;   Location: [facility] OR;  Service: Orthopedics;  Laterality: Left;   WISDOM TOOTH EXTRACTION     Social History   Occupational History   Occupation: Office manager    Comment: [employer]   Occupation: blogger    Comment: beer blog for [employer]  Tobacco Use   Smoking status: Former    Current packs/day: 0.00    Average packs/day: 1 pack/day for 23.0 years (23.0 ttl pk-yrs)    Types: Cigarettes    Start date: 05/28/1994    Quit date: 05/28/2017    Years since quitting: 6.8   Smokeless tobacco: Never  Vaping Use   Vaping status: Never Used  Substance and Sexual Activity   Alcohol use: Yes    Comment: 2 drinks per day   Drug use: No   Sexual activity: Yes    Partners: Male    Birth control/protection: None

## 2024-04-01 ENCOUNTER — Other Ambulatory Visit: Payer: Self-pay | Admitting: Physician Assistant

## 2024-04-01 ENCOUNTER — Other Ambulatory Visit: Payer: Self-pay | Admitting: Orthopaedic Surgery

## 2024-04-02 ENCOUNTER — Other Ambulatory Visit (HOSPITAL_COMMUNITY)

## 2024-04-06 ENCOUNTER — Encounter: Payer: Self-pay | Admitting: Orthopaedic Surgery

## 2024-04-10 ENCOUNTER — Encounter: Payer: Self-pay | Admitting: Orthopaedic Surgery

## 2024-04-10 ENCOUNTER — Ambulatory Visit
Admission: RE | Admit: 2024-04-10 | Discharge: 2024-04-10 | Disposition: A | Discharge status: Home / Self Care | Source: Ambulatory Visit | Attending: Orthopaedic Surgery | Admitting: Orthopaedic Surgery

## 2024-04-10 DIAGNOSIS — G8929 Other chronic pain: Secondary | ICD-10-CM

## 2024-04-17 ENCOUNTER — Other Ambulatory Visit

## 2024-04-17 ENCOUNTER — Encounter: Payer: Self-pay | Admitting: Orthopaedic Surgery

## 2024-04-20 ENCOUNTER — Encounter: Payer: Self-pay | Admitting: Radiology

## 2024-04-20 ENCOUNTER — Other Ambulatory Visit: Payer: Self-pay | Admitting: Physician Assistant

## 2024-04-21 ENCOUNTER — Ambulatory Visit: Admitting: Orthopaedic Surgery

## 2024-04-21 ENCOUNTER — Other Ambulatory Visit: Payer: Self-pay

## 2024-04-21 DIAGNOSIS — M1712 Unilateral primary osteoarthritis, left knee: Secondary | ICD-10-CM | POA: Insufficient documentation

## 2024-04-21 DIAGNOSIS — G8929 Other chronic pain: Secondary | ICD-10-CM

## 2024-04-21 DIAGNOSIS — M25571 Pain in right ankle and joints of right foot: Secondary | ICD-10-CM

## 2024-04-21 DIAGNOSIS — M1732 Unilateral post-traumatic osteoarthritis, left knee: Secondary | ICD-10-CM

## 2024-04-21 DIAGNOSIS — M25572 Pain in left ankle and joints of left foot: Secondary | ICD-10-CM

## 2024-04-21 DIAGNOSIS — M87052 Idiopathic aseptic necrosis of left femur: Secondary | ICD-10-CM

## 2024-04-21 MED ORDER — ACETAMINOPHEN-CODEINE 300-30 MG PO TABS
1.0000 | ORAL_TABLET | Freq: Every evening | ORAL | 0 refills | Status: AC | PRN
Start: 2024-04-21 — End: 2024-04-28

## 2024-04-21 NOTE — Progress Notes (Signed)
 Office Visit Note   Patient: [name] [name]           Date of Birth: 05/25/1981           MRN: [id number] Visit Date: 04/21/2024              Requested by: Loris Elsie PARAS, PA-C [address] [place],  [place] [address] PCP: Loris Elsie PARAS, PA-C   Assessment & Plan: Visit Diagnoses:  1. Post-traumatic osteoarthritis of left knee   2. Chronic pain of both ankles   3. 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  She is here to discuss MRI scans of bilateral knees.  She experiences severe left knee pain that disrupts her sleep and limits physical activity. The pain is more pronounced in the left knee compared to the right. She has a history of hip replacement due to avascular necrosis and currently takes tramadol  for pain management, which is ineffective. Tylenol  3 was previously helpful before her hip surgery.  She experiences stiffness and swelling in her ankles, particularly in the morning, though they are less painful than her knees. She is interested in obtaining baseline x-rays for her ankles for future reference.  She has addressed a previous drinking issue and is considering a change in profession to reduce physical demands, as her current job involves long shifts with constant movement. No allergies to nickel and no history of blood clots.  Results RADIOLOGY Left knee MRI: Bony infarcts in femur and tibia, avascular necrosis, articular surface collapse of lateral femoral condyle Right knee MRI: Bony infarcts in femur and tibia, avascular necrosis  Assessment and Plan Left knee avascular necrosis with articular collapse of lateral femoral condyle and secondary osteoarthritis Severe avascular necrosis with articular collapse causing significant pain and functional impairment. Conservative management  insufficient. Knee replacement indicated. - Scheduled left knee replacement surgery ASAP. - Prescribed Tylenol  3 for pain.. - Detailed surgical plan discussed.  Impression is severe left knee degenerative joint disease secondary to AVN.  Patient has attempted conservative treatment for at least 6 consecutive weeks within the past 12 weeks, including but not limited to physical therapy, home exercise program, NSAIDs, activity modification, and/or corticosteroid injections. Despite these efforts, symptoms have not improved or have worsened. Conservative measures have been deemed unsuccessful at this time. After a detailed discussion covering diagnosis and treatment options--including the risks, benefits, alternatives, and potential complications of surgical and nonsurgical management--the patient elected to proceed with surgery  Anticoagulants: No antithrombotic Postop anticoagulation: Eliquis Diabetic: No  Nickel allergy: No Prior DVT/PE: No Tobacco use: No Clearances needed for surgery: None Anticipated discharge dispo: Outpatient surgery   Right knee avascular necrosis with secondary osteoarthritis Avascular necrosis with secondary osteoarthritis. Less severe than left knee but may worsen due to compensatory mechanisms. - Monitor for potential future knee replacement.  Chronic right ankle pain and stiffness Chronic stiffness and swelling, particularly in the morning. - Ordered x-rays of the right ankle for baseline.  Follow-Up Instructions: No follow-ups on file.   Orders:  Orders Placed This Encounter  Procedures   XR Ankle Complete Left   XR Ankle Complete Right   Meds ordered this encounter  Medications   acetaminophen -codeine  (TYLENOL  #3) 300-30 MG tablet    Sig: Take 1-2 tablets by mouth at bedtime as needed for up  to 7 days for moderate pain (pain score 4-6).    Dispense:  30 tablet    Refill:  0      Procedures: No procedures performed   Clinical Data: No  additional findings.   Subjective: Chief Complaint  Patient presents with   Right Knee - Follow-up    MRI review   Left Knee - Follow-up    MRI review    HPI  Review of Systems   Objective: Vital Signs: There were no vitals taken for this visit.  Physical Exam  Ortho Exam  Specialty Comments:  No specialty comments available.  Imaging: XR Ankle Complete Right Result Date: 04/21/2024 X-rays of the right ankle show no acute findings.  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) 08/05/2019   Elevated blood pressure reading in office without diagnosis of hypertension 08/05/2019   Attention deficit hyperactivity disorder (ADHD) 06/22/2015   Past Medical History:  Diagnosis Date   ADHD (attention deficit hyperactivity disorder)    Arthritis    Depression    Ectopic pregnancy    History of kidney stones    Hypertension     Family History  Problem Relation Age of Onset   Hypertension Father    Heart disease Father    Healthy Mother     Past Surgical History:  Procedure Laterality Date   TOTAL HIP ARTHROPLASTY Right 08/31/2019   Procedure: RIGHT TOTAL HIP ARTHROPLASTY ANTERIOR APPROACH;  Surgeon: Jerri Kay HERO, MD;  Location: [facility] OR;  Service: Orthopedics;  Laterality: Right;   TOTAL HIP ARTHROPLASTY Left 11/04/2023   Procedure: ARTHROPLASTY, HIP, TOTAL, ANTERIOR APPROACH;  Surgeon: Jerri Kay HERO, MD;  Location: [facility] OR;  Service: Orthopedics;  Laterality: Left;   WISDOM TOOTH EXTRACTION     Social History   Occupational History   Occupation: office manager    Comment: [employer]    Occupation: blogger    Comment: beer blog for [employer]  Tobacco Use   Smoking status: Former    Current packs/day: 0.00    Average packs/day: 1 pack/day for 23.0 years (23.0 ttl pk-yrs)    Types: Cigarettes    Start date: 05/28/1994    Quit date: 05/28/2017    Years since quitting: 6.9   Smokeless tobacco: Never  Vaping Use   Vaping status: Never Used  Substance and Sexual Activity   Alcohol use: Yes    Comment: 2 drinks per day   Drug use: No   Sexual activity: Yes    Partners: Male    Birth control/protection: None

## 2024-04-24 ENCOUNTER — Telehealth: Payer: Self-pay | Admitting: Orthopaedic Surgery

## 2024-04-24 ENCOUNTER — Encounter: Payer: Self-pay | Admitting: Orthopaedic Surgery

## 2024-04-24 NOTE — Telephone Encounter (Signed)
 Called patient to offer surgery date for left total knee with Dr. Jerri.  No answer.  Left message on patient's voicemail providing name and direct number to schedule.

## 2024-04-28 ENCOUNTER — Encounter: Payer: Self-pay | Admitting: Orthopaedic Surgery

## 2024-05-05 ENCOUNTER — Ambulatory Visit: Admitting: Orthopaedic Surgery

## 2024-05-06 ENCOUNTER — Other Ambulatory Visit: Payer: Self-pay | Admitting: Physician Assistant

## 2024-05-12 ENCOUNTER — Encounter: Payer: Self-pay | Admitting: Orthopaedic Surgery

## 2024-05-12 ENCOUNTER — Ambulatory Visit: Admitting: Orthopaedic Surgery

## 2024-05-12 DIAGNOSIS — M25561 Pain in right knee: Secondary | ICD-10-CM | POA: Diagnosis not present

## 2024-05-12 DIAGNOSIS — M1711 Unilateral primary osteoarthritis, right knee: Secondary | ICD-10-CM

## 2024-05-12 MED ORDER — LIDOCAINE HCL 1 % IJ SOLN
2.0000 mL | INTRAMUSCULAR | Status: AC | PRN
  Administered 2024-05-12: 2 mL

## 2024-05-12 MED ORDER — METHYLPREDNISOLONE ACETATE 40 MG/ML IJ SUSP
40.0000 mg | INTRAMUSCULAR | Status: AC | PRN
  Administered 2024-05-12: 40 mg via INTRA_ARTICULAR

## 2024-05-12 MED ORDER — BUPIVACAINE HCL 0.5 % IJ SOLN
2.0000 mL | INTRAMUSCULAR | Status: AC | PRN
  Administered 2024-05-12: 2 mL via INTRA_ARTICULAR

## 2024-05-12 NOTE — Progress Notes (Signed)
   Office Visit Note   Patient: [name] [name]           Date of Birth: Nov 13, 1980           MRN: [id number] Visit Date: 05/12/2024              Requested by: Loris Elsie PARAS, PA-C [id number] [place],  [place] [address] PCP: Loris Elsie PARAS, PA-C   Assessment & Plan: Visit Diagnoses:  1. Primary osteoarthritis of right knee     Plan: History of Present Illness [name] [name] is a 43 year old female who presents for a right knee injection and discussion of knee replacement options.  She previously considered bilateral knee replacements but declined due to concerns about pain and complications. She is scheduled for a left knee replacement on June 08, 2024.  Assessment and Plan Right knee osteoarthritis Chronic osteoarthritis with significant pain. Discussed risks of bilateral knee replacement, including pain, immobility, and complications. She prefers unilateral surgery. - Administered right knee cortisone injection.  Left knee osteoarthritis (scheduled for total knee arthroplasty) Osteoarthritis with scheduled total knee arthroplasty on December 22nd. She is preparing for surgery. - Proceed with scheduled left knee total arthroplasty on December 22nd.  Follow-Up Instructions: No follow-ups on file.   Orders:  No orders of the defined types were placed in this encounter.  No orders of the defined types were placed in this encounter.     Procedures: Large Joint Inj: R knee on 05/12/2024 12:50 PM Indications: pain Details: 22 G needle  Arthrogram: No  Medications: 40 mg methylPREDNISolone  acetate 40 MG/ML; 2 mL lidocaine  1 %; 2 mL bupivacaine  0.5 % Consent was given by the patient. Patient was prepped and draped in the usual sterile fashion.        Social History   Occupational History   Occupation: office manager    Comment: [employer]   Occupation: blogger    Comment: beer blog for [employer]  Tobacco Use   Smoking status: Former     Current packs/day: 0.00    Average packs/day: 1 pack/day for 23.0 years (23.0 ttl pk-yrs)    Types: Cigarettes    Start date: 05/28/1994    Quit date: 05/28/2017    Years since quitting: 6.9   Smokeless tobacco: Never  Vaping Use   Vaping status: Never Used  Substance and Sexual Activity   Alcohol use: Yes    Comment: 2 drinks per day   Drug use: No   Sexual activity: Yes    Partners: Male    Birth control/protection: None

## 2024-05-17 ENCOUNTER — Other Ambulatory Visit: Payer: Self-pay | Admitting: Physician Assistant

## 2024-05-27 ENCOUNTER — Other Ambulatory Visit: Payer: Self-pay | Admitting: Physician Assistant

## 2024-05-27 ENCOUNTER — Other Ambulatory Visit: Payer: Self-pay | Admitting: Orthopaedic Surgery

## 2024-05-27 MED ORDER — DOCUSATE SODIUM 100 MG PO CAPS: 100.0000 mg | ORAL_CAPSULE | Freq: Every day | ORAL | 2 refills | Status: AC | PRN

## 2024-05-27 MED ORDER — ONDANSETRON HCL 4 MG PO TABS: 4.0000 mg | ORAL_TABLET | Freq: Three times a day (TID) | ORAL | 0 refills | Status: DC | PRN

## 2024-05-27 MED ORDER — ASPIRIN 81 MG PO CHEW: 81.0000 mg | CHEWABLE_TABLET | Freq: Two times a day (BID) | ORAL | 0 refills | Status: AC

## 2024-05-27 MED ORDER — METHOCARBAMOL 750 MG PO TABS: 750.0000 mg | ORAL_TABLET | Freq: Two times a day (BID) | ORAL | 1 refills | Status: DC | PRN

## 2024-05-27 MED ORDER — OXYCODONE-ACETAMINOPHEN 5-325 MG PO TABS: 1.0000 | ORAL_TABLET | Freq: Four times a day (QID) | ORAL | 0 refills | Status: DC | PRN

## 2024-05-28 ENCOUNTER — Other Ambulatory Visit: Payer: Self-pay | Admitting: Physician Assistant

## 2024-05-28 ENCOUNTER — Encounter: Payer: Self-pay | Admitting: Orthopaedic Surgery

## 2024-05-29 NOTE — Telephone Encounter (Signed)
 That's weird.  I refilled it but just changed the instructions slightly.  She should call [facility] to double check.

## 2024-05-31 ENCOUNTER — Other Ambulatory Visit: Payer: Self-pay | Admitting: Physician Assistant

## 2024-06-01 NOTE — Telephone Encounter (Signed)
 I just sent tylenol  #3 on the 10th.

## 2024-06-01 NOTE — Pre-Procedure Instructions (Signed)
 Surgical Instructions   Your procedure is scheduled on June 08, 2024. Report to [facility] Main Entrance A at 5:30 A.M., then check in with the Admitting office. Any questions or running late day of surgery: call [phone]  Questions prior to your surgery date: call [phone], Monday-Friday, 8am-4pm. If you experience any cold or flu symptoms such as cough, fever, chills, shortness of breath, etc. between now and your scheduled surgery, please notify us  at the above number.     Remember:  Do not eat after midnight the night before your surgery  You may drink clear liquids until 4:30Am the morning of your surgery.   Clear liquids allowed are: Water, Non-Citrus Juices (without pulp), Carbonated Beverages, Clear Tea (no milk, honey, etc.), Black Coffee Only (NO MILK, CREAM OR POWDERED CREAMER of any kind), and Gatorade.  Patient Instructions  The night before surgery:  No food after midnight. ONLY clear liquids after midnight  The day of surgery (if you do NOT have diabetes):  Drink ONE (1) Pre-Surgery Clear Ensure by 4:30AM the morning of surgery. Drink in one sitting. Do not sip.  This drink was given to you during your hospital  pre-op appointment visit.  Nothing else to drink after completing the  Pre-Surgery Clear Ensure.         If you have questions, please contact your surgeons office.     Take these medicines the morning of surgery with A SIP OF WATER: buPROPion  (WELLBUTRIN  XL)  doxycycline  (VIBRA -TABS)    May take these medicines IF NEEDED: acetaminophen -codeine  (TYLENOL  #3)  methocarbamol  (ROBAXIN )   One week prior to surgery, STOP taking any Aspirin  (unless otherwise instructed by your surgeon) Aleve , Naproxen , Ibuprofen, Motrin, Advil, Goody's, BC's, all herbal medications, fish oil, and non-prescription vitamins.                     Do NOT Smoke (Tobacco/Vaping) for 24 hours prior to your procedure.  If you use a CPAP at night, you may bring  your mask/headgear for your overnight stay.   You will be asked to remove any contacts, glasses, piercing's, hearing aid's, dentures/partials prior to surgery. Please bring cases for these items if needed.    Patients discharged the day of surgery will not be allowed to drive home, and someone needs to stay with them for 24 hours.  SURGICAL WAITING ROOM VISITATION Patients may have no more than 2 support people in the waiting area - these visitors may rotate.   Pre-op nurse will coordinate an appropriate time for 1 ADULT support person, who may not rotate, to accompany patient in pre-op.  Children under the age of 49 must have an adult with them who is not the patient and must remain in the main waiting area with an adult.  If the patient needs to stay at the hospital during part of their recovery, the visitor guidelines for inpatient rooms apply.  Please refer to the [facility] website for the visitor guidelines for any additional information.   If you received a COVID test during your pre-op visit  it is requested that you wear a mask when out in public, stay away from anyone that may not be feeling well and notify your surgeon if you develop symptoms. If you have been in contact with anyone that has tested positive in the last 10 days please notify you surgeon.      Pre-operative 4 CHG Bathing Instructions   You can play a key role in  reducing the risk of infection after surgery. Your skin needs to be as free of germs as possible. You can reduce the number of germs on your skin by washing with CHG (chlorhexidine  gluconate) soap before surgery. CHG is an antiseptic soap that kills germs and continues to kill germs even after washing.   DO NOT use if you have an allergy to chlorhexidine /CHG or antibacterial soaps. If your skin becomes reddened or irritated, stop using the CHG and notify one of our RNs at [phone].   Please shower with the CHG soap starting 4 days before surgery using  the following schedule:     Please keep in mind the following:  DO NOT shave, including legs and underarms, starting the day of your first shower.   You may shave your face at any point before/day of surgery.  Place clean sheets on your bed the day you start using CHG soap. Use a clean washcloth (not used since being washed) for each shower. DO NOT sleep with pets once you start using the CHG.   CHG Shower Instructions:  Wash your face and private area with normal soap. If you choose to wash your hair, wash first with your normal shampoo.  After you use shampoo/soap, rinse your hair and body thoroughly to remove shampoo/soap residue.  Turn the water OFF and apply  bottle of CHG soap to a CLEAN washcloth.  Apply CHG soap ONLY FROM YOUR NECK DOWN TO YOUR TOES (washing for 3-5 minutes)  DO NOT use CHG soap on face, private areas, open wounds, or sores.  Pay special attention to the area where your surgery is being performed.  If you are having back surgery, having someone wash your back for you may be helpful. Wait 2 minutes after CHG soap is applied, then you may rinse off the CHG soap.  Pat dry with a clean towel  Put on clean clothes/pajamas   If you choose to wear lotion, please use ONLY the CHG-compatible lotions that are listed below.  Additional instructions for the day of surgery:  If you choose, you may shower the morning of surgery with an antibacterial soap.  DO NOT APPLY any lotions, deodorants, cologne, or perfumes.   Do not bring valuables to the hospital. [facility] is not responsible for any belongings/valuables. Do not wear nail polish, gel polish, artificial nails, or any other type of covering on natural nails (fingers and toes) Do not wear jewelry or makeup Put on clean/comfortable clothes.  Please brush your teeth.  Ask your nurse before applying any prescription medications to the skin.     CHG Compatible Lotions   Aveeno Moisturizing lotion  Cetaphil  Moisturizing Cream  Cetaphil Moisturizing Lotion  Clairol Herbal Essence Moisturizing Lotion, Dry Skin  Clairol Herbal Essence Moisturizing Lotion, Extra Dry Skin  Clairol Herbal Essence Moisturizing Lotion, Normal Skin  Curel Age Defying Therapeutic Moisturizing Lotion with Alpha Hydroxy  Curel Extreme Care Body Lotion  Curel Soothing Hands Moisturizing Hand Lotion  Curel Therapeutic Moisturizing Cream, Fragrance-Free  Curel Therapeutic Moisturizing Lotion, Fragrance-Free  Curel Therapeutic Moisturizing Lotion, Original Formula  Eucerin Daily Replenishing Lotion  Eucerin Dry Skin Therapy Plus Alpha Hydroxy Crme  Eucerin Dry Skin Therapy Plus Alpha Hydroxy Lotion  Eucerin Original Crme  Eucerin Original Lotion  Eucerin Plus Crme Eucerin Plus Lotion  Eucerin TriLipid Replenishing Lotion  Keri Anti-Bacterial Hand Lotion  Keri Deep Conditioning Original Lotion Dry Skin Formula Softly Scented  Keri Deep Conditioning Original Lotion, Fragrance Free Sensitive Skin  Formula  Keri Lotion Fast Absorbing Fragrance Free Sensitive Skin Formula  Keri Lotion Fast Absorbing Softly Scented Dry Skin Formula  Keri Original Lotion  Keri Skin Renewal Lotion Keri Silky Smooth Lotion  Keri Silky Smooth Sensitive Skin Lotion  Nivea Body Creamy Conditioning Oil  Nivea Body Extra Enriched Lotion  Nivea Body Original Lotion  Nivea Body Sheer Moisturizing Lotion Nivea Crme  Nivea Skin Firming Lotion  NutraDerm 30 Skin Lotion  NutraDerm Skin Lotion  NutraDerm Therapeutic Skin Cream  NutraDerm Therapeutic Skin Lotion  ProShield Protective Hand Cream  Provon moisturizing lotion  Please read over the following fact sheets that you were given.

## 2024-06-02 ENCOUNTER — Inpatient Hospital Stay (HOSPITAL_COMMUNITY)
Admission: RE | Admit: 2024-06-02 | Discharge: 2024-06-02 | Attending: Orthopaedic Surgery | Admitting: Orthopaedic Surgery

## 2024-06-02 ENCOUNTER — Encounter (HOSPITAL_COMMUNITY): Payer: Self-pay

## 2024-06-02 ENCOUNTER — Other Ambulatory Visit: Payer: Self-pay

## 2024-06-02 VITALS — BP 127/78 | HR 71 | Temp 98.1°F | Resp 18 | Ht 64.0 in | Wt 171.6 lb

## 2024-06-02 DIAGNOSIS — M1712 Unilateral primary osteoarthritis, left knee: Secondary | ICD-10-CM | POA: Insufficient documentation

## 2024-06-02 DIAGNOSIS — Z01818 Encounter for other preprocedural examination: Secondary | ICD-10-CM

## 2024-06-02 DIAGNOSIS — Z01812 Encounter for preprocedural laboratory examination: Secondary | ICD-10-CM | POA: Insufficient documentation

## 2024-06-02 LAB — CBC
HCT: 36.4 % (ref 36.0–46.0)
Hemoglobin: 11.1 g/dL — ABNORMAL LOW (ref 12.0–15.0)
MCH: 26.4 pg (ref 26.0–34.0)
MCHC: 30.5 g/dL (ref 30.0–36.0)
MCV: 86.5 fL (ref 80.0–100.0)
Platelets: 540 K/uL — ABNORMAL HIGH (ref 150–400)
RBC: 4.21 MIL/uL (ref 3.87–5.11)
RDW: 14.2 % (ref 11.5–15.5)
WBC: 11.4 K/uL — ABNORMAL HIGH (ref 4.0–10.5)
nRBC: 0 % (ref 0.0–0.2)

## 2024-06-02 LAB — BASIC METABOLIC PANEL WITH GFR
Anion gap: 11 (ref 5–15)
BUN: 14 mg/dL (ref 6–20)
CO2: 21 mmol/L — ABNORMAL LOW (ref 22–32)
Calcium: 9.4 mg/dL (ref 8.9–10.3)
Chloride: 105 mmol/L (ref 98–111)
Creatinine, Ser: 0.74 mg/dL (ref 0.44–1.00)
GFR, Estimated: >60 mL/min (ref >60)
Glucose, Bld: 104 mg/dL — ABNORMAL HIGH (ref 70–99)
Potassium: 3.9 mmol/L (ref 3.5–5.1)
Sodium: 137 mmol/L (ref 135–145)

## 2024-06-02 LAB — SURGICAL PCR SCREEN
MRSA, PCR: NEGATIVE
Staphylococcus aureus: NEGATIVE

## 2024-06-02 NOTE — Progress Notes (Signed)
 PCP - Loris Fallow, PA Cardiologist - denies  PPM/ICD - denies Device Orders - n/a  Rep Notified - n/a  Chest x-ray - 06/17/2021 EKG - 10/28/2023 Stress Test - denies ECHO - denies Cardiac Cath - denies  Sleep Study - denies CPAP - n/a  Fasting Blood Sugar - no DM Checks Blood Sugar _____ times a day  Last dose of GLP1 agonist-  denies GLP1 instructions: n/a  Blood Thinner Instructions: n/a Aspirin  Instructions: n/a  ERAS Protcol - yes PRE-SURGERY Ensure or G2- Ensure  COVID TEST- n/a   Anesthesia review: no  Patient denies shortness of breath, fever, cough and chest pain at PAT appointment   All instructions explained to the patient, with a verbal understanding of the material. Patient agrees to go over the instructions while at home for a better understanding. Patient also instructed to self quarantine after being tested for COVID-19. The opportunity to ask questions was provided.

## 2024-06-02 NOTE — Pre-Procedure Instructions (Addendum)
 Surgical Instructions     Your procedure is scheduled on June 08, 2024. Report to [facility] Main Entrance A at 5:30 A.M., then check in with the Admitting office. Any questions or running late day of surgery: call [phone]   Questions prior to your surgery date: call [phone], Monday-Friday, 8am-4pm. If you experience any cold or flu symptoms such as cough, fever, chills, shortness of breath, etc. between now and your scheduled surgery, please notify us  at the above number.            Remember:       Do not eat after midnight the night before your surgery   You may drink clear liquids until 4:30Am the morning of your surgery.   Clear liquids allowed are: Water, Non-Citrus Juices (without pulp), Carbonated Beverages, Clear Tea (no milk, honey, etc.), Black Coffee Only (NO MILK, CREAM OR POWDERED CREAMER of any kind), and Gatorade.   Patient Instructions   The night before surgery:  No food after midnight. ONLY clear liquids after midnight   The day of surgery (if you do NOT have diabetes):  Drink ONE (1) Pre-Surgery Clear Ensure by 4:30AM the morning of surgery. Drink in one sitting. Do not sip.  This drink was given to you during your hospital  pre-op appointment visit.   Nothing else to drink after completing the  Pre-Surgery Clear Ensure.          If you have questions, please contact your surgeons office.            Take these medicines the morning of surgery with A SIP OF WATER: buPROPion  (WELLBUTRIN  XL)   May take these medicines IF NEEDED: acetaminophen -codeine  (TYLENOL  #3)  methocarbamol  (ROBAXIN )  ondansetron  (ZOFRAN -ODT) traMADol  (ULTRAM )   One week prior to surgery, STOP taking any Aspirin  (unless otherwise instructed by your surgeon) Aleve , Naproxen , Ibuprofen, Motrin, Advil, Goody's, BC's, all herbal medications, fish oil, and non-prescription vitamins.                     Do NOT Smoke (Tobacco/Vaping) for 24 hours prior to your procedure.    If you use a CPAP at night, you may bring your mask/headgear for your overnight stay.   You will be asked to remove any contacts, glasses, piercing's, hearing aid's, dentures/partials prior to surgery. Please bring cases for these items if needed.    Patients discharged the day of surgery will not be allowed to drive home, and someone needs to stay with them for 24 hours.   SURGICAL WAITING ROOM VISITATION Patients may have no more than 2 support people in the waiting area - these visitors may rotate.   Pre-op nurse will coordinate an appropriate time for 1 ADULT support person, who may not rotate, to accompany patient in pre-op.  Children under the age of 50 must have an adult with them who is not the patient and must remain in the main waiting area with an adult.   If the patient needs to stay at the hospital during part of their recovery, the visitor guidelines for inpatient rooms apply.   Please refer to the [facility] website for the visitor guidelines for any additional information.     If you received a COVID test during your pre-op visit  it is requested that you wear a mask when out in public, stay away from anyone that may not be feeling well and notify your surgeon if you develop symptoms. If you have been in  contact with anyone that has tested positive in the last 10 days please notify you surgeon.         Pre-operative 4 CHG Bathing Instructions    You can play a key role in reducing the risk of infection after surgery. Your skin needs to be as free of germs as possible. You can reduce the number of germs on your skin by washing with CHG (chlorhexidine  gluconate) soap before surgery. CHG is an antiseptic soap that kills germs and continues to kill germs even after washing.    DO NOT use if you have an allergy to chlorhexidine /CHG or antibacterial soaps. If your skin becomes reddened or irritated, stop using the CHG and notify one of our RNs at [phone].    Please shower  with the CHG soap starting 4 days before surgery using the following schedule:       Please keep in mind the following:  DO NOT shave, including legs and underarms, starting the day of your first shower.   You may shave your face at any point before/day of surgery.  Place clean sheets on your bed the day you start using CHG soap. Use a clean washcloth (not used since being washed) for each shower. DO NOT sleep with pets once you start using the CHG.    CHG Shower Instructions:  Wash your face and private area with normal soap. If you choose to wash your hair, wash first with your normal shampoo.  After you use shampoo/soap, rinse your hair and body thoroughly to remove shampoo/soap residue.  Turn the water OFF and apply  bottle of CHG soap to a CLEAN washcloth.  Apply CHG soap ONLY FROM YOUR NECK DOWN TO YOUR TOES (washing for 3-5 minutes)  DO NOT use CHG soap on face, private areas, open wounds, or sores.  Pay special attention to the area where your surgery is being performed.  If you are having back surgery, having someone wash your back for you may be helpful. Wait 2 minutes after CHG soap is applied, then you may rinse off the CHG soap.  Pat dry with a clean towel  Put on clean clothes/pajamas   If you choose to wear lotion, please use ONLY the CHG-compatible lotions that are listed below.   Additional instructions for the day of surgery:   If you choose, you may shower the morning of surgery with an antibacterial soap.  DO NOT APPLY any lotions, deodorants, cologne, or perfumes.   Do not bring valuables to the hospital. [facility] is not responsible for any belongings/valuables. Do not wear nail polish, gel polish, artificial nails, or any other type of covering on natural nails (fingers and toes) Do not wear jewelry or makeup Put on clean/comfortable clothes.  Please brush your teeth.  Ask your nurse before applying any prescription medications to the skin.        CHG  Compatible Lotions    Aveeno Moisturizing lotion  Cetaphil Moisturizing Cream  Cetaphil Moisturizing Lotion  Clairol Herbal Essence Moisturizing Lotion, Dry Skin  Clairol Herbal Essence Moisturizing Lotion, Extra Dry Skin  Clairol Herbal Essence Moisturizing Lotion, Normal Skin  Curel Age Defying Therapeutic Moisturizing Lotion with Alpha Hydroxy  Curel Extreme Care Body Lotion  Curel Soothing Hands Moisturizing Hand Lotion  Curel Therapeutic Moisturizing Cream, Fragrance-Free  Curel Therapeutic Moisturizing Lotion, Fragrance-Free  Curel Therapeutic Moisturizing Lotion, Original Formula  Eucerin Daily Replenishing Lotion  Eucerin Dry Skin Therapy Plus Alpha Hydroxy Crme  Eucerin Dry Skin Therapy  Plus Alpha Hydroxy Lotion  Eucerin Original Crme  Eucerin Original Lotion  Eucerin Plus Crme Eucerin Plus Lotion  Eucerin TriLipid Replenishing Lotion  Keri Anti-Bacterial Hand Lotion  Keri Deep Conditioning Original Lotion Dry Skin Formula Softly Scented  Keri Deep Conditioning Original Lotion, Fragrance Free Sensitive Skin Formula  Keri Lotion Fast Absorbing Fragrance Free Sensitive Skin Formula  Keri Lotion Fast Absorbing Softly Scented Dry Skin Formula  Keri Original Lotion  Keri Skin Renewal Lotion Keri Silky Smooth Lotion  Keri Silky Smooth Sensitive Skin Lotion  Nivea Body Creamy Conditioning Oil  Nivea Body Extra Enriched Lotion  Nivea Body Original Lotion  Nivea Body Sheer Moisturizing Lotion Nivea Crme  Nivea Skin Firming Lotion  NutraDerm 30 Skin Lotion  NutraDerm Skin Lotion  NutraDerm Therapeutic Skin Cream  NutraDerm Therapeutic Skin Lotion  ProShield Protective Hand Cream  Provon moisturizing lotion   Please read over the following fact sheets that you were given.

## 2024-06-05 ENCOUNTER — Other Ambulatory Visit: Payer: Self-pay | Admitting: Orthopaedic Surgery

## 2024-06-05 ENCOUNTER — Other Ambulatory Visit: Payer: Self-pay | Admitting: Physician Assistant

## 2024-06-05 NOTE — Telephone Encounter (Signed)
 I think this is intended for you

## 2024-06-07 NOTE — Anesthesia Preprocedure Evaluation (Addendum)
"                                    Anesthesia Evaluation  Patient identified by MRN, date of birth, ID band Patient awake    Reviewed: Allergy & Precautions, NPO status , Patient's Chart, lab work & pertinent test results  History of Anesthesia Complications Negative for: history of anesthetic complications  Airway Mallampati: I  TM Distance: >3 FB Neck ROM: Full    Dental  (+) Dental Advisory Given   Pulmonary former smoker   breath sounds clear to auscultation       Cardiovascular hypertension, Pt. on medications (-) angina  Rhythm:Regular Rate:Normal     Neuro/Psych  PSYCHIATRIC DISORDERS (ADHD)  Depression    negative neurological ROS     GI/Hepatic negative GI ROS, Neg liver ROS,,,  Endo/Other  diabetes, Oral Hypoglycemic Agents  BMI 29  Renal/GU negative Renal ROS     Musculoskeletal  (+) Arthritis ,    Abdominal   Peds  Hematology Hb 11.1, plt 540k   Anesthesia Other Findings   Reproductive/Obstetrics                              Anesthesia Physical Anesthesia Plan  ASA: 2  Anesthesia Plan: Spinal   Post-op Pain Management: Tylenol  PO (pre-op)* and Regional block*   Induction:   PONV Risk Score and Plan: 2  Airway Management Planned:   Additional Equipment: None  Intra-op Plan:   Post-operative Plan: Extubation in OR  Informed Consent: I have reviewed the patients History and Physical, chart, labs and discussed the procedure including the risks, benefits and alternatives for the proposed anesthesia with the patient or authorized representative who has indicated his/her understanding and acceptance.     Dental advisory given  Plan Discussed with: CRNA and Surgeon  Anesthesia Plan Comments: (Plan routine monitors SAB with adductor canal block)         Anesthesia Quick Evaluation  "

## 2024-06-08 ENCOUNTER — Observation Stay (HOSPITAL_COMMUNITY)
Admission: RE | Admit: 2024-06-08 | Discharge: 2024-06-08 | Disposition: A | Discharge status: Home / Self Care | Attending: Orthopaedic Surgery | Admitting: Orthopaedic Surgery

## 2024-06-08 ENCOUNTER — Encounter: Payer: Self-pay | Admitting: Orthopaedic Surgery

## 2024-06-08 ENCOUNTER — Ambulatory Visit (HOSPITAL_COMMUNITY): Payer: Self-pay | Admitting: Anesthesiology

## 2024-06-08 ENCOUNTER — Observation Stay (HOSPITAL_COMMUNITY)

## 2024-06-08 ENCOUNTER — Encounter (HOSPITAL_COMMUNITY): Payer: Self-pay | Admitting: Orthopaedic Surgery

## 2024-06-08 ENCOUNTER — Telehealth (HOSPITAL_COMMUNITY): Payer: Self-pay

## 2024-06-08 ENCOUNTER — Other Ambulatory Visit (HOSPITAL_COMMUNITY): Payer: Self-pay

## 2024-06-08 ENCOUNTER — Other Ambulatory Visit: Payer: Self-pay

## 2024-06-08 ENCOUNTER — Encounter: Admission: RE | Disposition: A | Payer: Self-pay | Attending: Orthopaedic Surgery

## 2024-06-08 DIAGNOSIS — M87052 Idiopathic aseptic necrosis of left femur: Secondary | ICD-10-CM | POA: Insufficient documentation

## 2024-06-08 DIAGNOSIS — E119 Type 2 diabetes mellitus without complications: Secondary | ICD-10-CM | POA: Diagnosis not present

## 2024-06-08 DIAGNOSIS — M1712 Unilateral primary osteoarthritis, left knee: Secondary | ICD-10-CM | POA: Diagnosis not present

## 2024-06-08 DIAGNOSIS — Z96652 Presence of left artificial knee joint: Secondary | ICD-10-CM

## 2024-06-08 DIAGNOSIS — Z79899 Other long term (current) drug therapy: Secondary | ICD-10-CM | POA: Insufficient documentation

## 2024-06-08 DIAGNOSIS — Z87891 Personal history of nicotine dependence: Secondary | ICD-10-CM | POA: Insufficient documentation

## 2024-06-08 DIAGNOSIS — Z01818 Encounter for other preprocedural examination: Secondary | ICD-10-CM

## 2024-06-08 DIAGNOSIS — Z7984 Long term (current) use of oral hypoglycemic drugs: Secondary | ICD-10-CM | POA: Diagnosis not present

## 2024-06-08 DIAGNOSIS — I10 Essential (primary) hypertension: Secondary | ICD-10-CM | POA: Insufficient documentation

## 2024-06-08 DIAGNOSIS — M25562 Pain in left knee: Secondary | ICD-10-CM | POA: Diagnosis present

## 2024-06-08 HISTORY — PX: TOTAL KNEE ARTHROPLASTY: SHX125

## 2024-06-08 LAB — POCT PREGNANCY, URINE: Preg Test, Ur: NEGATIVE

## 2024-06-08 SURGERY — ARTHROPLASTY, KNEE, TOTAL
Anesthesia: Spinal | Site: Knee | Laterality: Left

## 2024-06-08 MED ORDER — TRANEXAMIC ACID 1000 MG/10ML IV SOLN
2000.0000 mg | INTRAVENOUS | Status: DC
  Filled 2024-06-08: qty 20

## 2024-06-08 MED ORDER — ACETAMINOPHEN 500 MG PO TABS
ORAL_TABLET | ORAL | Status: AC
  Filled 2024-06-08: qty 2

## 2024-06-08 MED ORDER — MEPERIDINE HCL 25 MG/ML IJ SOLN: 6.2500 mg | INTRAMUSCULAR | Status: DC | PRN

## 2024-06-08 MED ORDER — PHENYLEPHRINE 80 MCG/ML (10ML) SYRINGE FOR IV PUSH (FOR BLOOD PRESSURE SUPPORT)
PREFILLED_SYRINGE | INTRAVENOUS | Status: DC | PRN
  Administered 2024-06-08: 80 ug via INTRAVENOUS

## 2024-06-08 MED ORDER — FENTANYL CITRATE (PF) 100 MCG/2ML IJ SOLN
50.0000 ug | Freq: Once | INTRAMUSCULAR | Status: AC
  Administered 2024-06-08: 50 ug via INTRAVENOUS

## 2024-06-08 MED ORDER — ONDANSETRON HCL 4 MG/2ML IJ SOLN
INTRAMUSCULAR | Status: DC | PRN
  Administered 2024-06-08: 4 mg via INTRAVENOUS

## 2024-06-08 MED ORDER — VANCOMYCIN HCL 1000 MG IV SOLR
INTRAVENOUS | Status: AC
  Filled 2024-06-08: qty 20

## 2024-06-08 MED ORDER — ACETAMINOPHEN 500 MG PO TABS
1000.0000 mg | ORAL_TABLET | Freq: Once | ORAL | Status: DC
  Filled 2024-06-08: qty 2

## 2024-06-08 MED ORDER — DOCUSATE SODIUM 100 MG PO CAPS
100.0000 mg | ORAL_CAPSULE | Freq: Two times a day (BID) | ORAL | Status: DC
  Administered 2024-06-08: 100 mg via ORAL
  Filled 2024-06-08: qty 1

## 2024-06-08 MED ORDER — ONDANSETRON HCL 4 MG PO TABS: 4.0000 mg | ORAL_TABLET | Freq: Four times a day (QID) | ORAL | Status: DC | PRN

## 2024-06-08 MED ORDER — TRANEXAMIC ACID-NACL 1000-0.7 MG/100ML-% IV SOLN
1000.0000 mg | INTRAVENOUS | Status: AC
  Administered 2024-06-08: 1000 mg via INTRAVENOUS
  Filled 2024-06-08: qty 100

## 2024-06-08 MED ORDER — DEXAMETHASONE SOD PHOSPHATE PF 10 MG/ML IJ SOLN: 10.0000 mg | Freq: Once | INTRAMUSCULAR | Status: DC

## 2024-06-08 MED ORDER — DEXAMETHASONE SOD PHOSPHATE PF 10 MG/ML IJ SOLN
INTRAMUSCULAR | Status: DC | PRN
  Administered 2024-06-08: 10 mg via INTRAVENOUS

## 2024-06-08 MED ORDER — ACETAMINOPHEN 325 MG PO TABS: 325.0000 mg | ORAL_TABLET | Freq: Four times a day (QID) | ORAL | Status: DC | PRN

## 2024-06-08 MED ORDER — HYDRALAZINE HCL 20 MG/ML IJ SOLN
10.0000 mg | Freq: Once | INTRAMUSCULAR | Status: AC
  Administered 2024-06-08: 10 mg via INTRAVENOUS

## 2024-06-08 MED ORDER — OXYCODONE HCL ER 10 MG PO T12A
10.0000 mg | EXTENDED_RELEASE_TABLET | Freq: Two times a day (BID) | ORAL | Status: DC
  Administered 2024-06-08: 10 mg via ORAL
  Filled 2024-06-08: qty 1

## 2024-06-08 MED ORDER — MIDAZOLAM HCL (PF) 2 MG/2ML IJ SOLN
INTRAMUSCULAR | Status: DC | PRN
  Administered 2024-06-08: 2 mg via INTRAVENOUS

## 2024-06-08 MED ORDER — LIDOCAINE 2% (20 MG/ML) 5 ML SYRINGE
INTRAMUSCULAR | Status: DC | PRN
  Administered 2024-06-08: 60 mg via INTRAVENOUS

## 2024-06-08 MED ORDER — FENTANYL CITRATE (PF) 100 MCG/2ML IJ SOLN
INTRAMUSCULAR | Status: AC
  Filled 2024-06-08: qty 2

## 2024-06-08 MED ORDER — HYDROMORPHONE HCL 1 MG/ML IJ SOLN
INTRAMUSCULAR | Status: AC
  Filled 2024-06-08: qty 1

## 2024-06-08 MED ORDER — ROPIVACAINE HCL 7.5 MG/ML IJ SOLN
INTRAMUSCULAR | Status: DC | PRN
  Administered 2024-06-08: 20 mL via PERINEURAL

## 2024-06-08 MED ORDER — TRANEXAMIC ACID 1000 MG/10ML IV SOLN
INTRAVENOUS | Status: DC | PRN
  Administered 2024-06-08: 2000 mg via TOPICAL

## 2024-06-08 MED ORDER — LACTATED RINGERS IV BOLUS
500.0000 mL | Freq: Once | INTRAVENOUS | Status: AC
  Administered 2024-06-08: 500 mL via INTRAVENOUS

## 2024-06-08 MED ORDER — LACTATED RINGERS IV SOLN: INTRAVENOUS | Status: DC

## 2024-06-08 MED ORDER — HYDROMORPHONE HCL 1 MG/ML IJ SOLN
0.2500 mg | INTRAMUSCULAR | Status: DC | PRN
  Administered 2024-06-08 (×4): 0.5 mg via INTRAVENOUS

## 2024-06-08 MED ORDER — FENTANYL CITRATE (PF) 100 MCG/2ML IJ SOLN
INTRAMUSCULAR | Status: DC | PRN
  Administered 2024-06-08 (×2): 50 ug via INTRAVENOUS

## 2024-06-08 MED ORDER — OXYCODONE HCL 5 MG PO TABS
5.0000 mg | ORAL_TABLET | Freq: Once | ORAL | Status: AC | PRN
  Administered 2024-06-08: 5 mg via ORAL

## 2024-06-08 MED ORDER — ACETAMINOPHEN 500 MG PO TABS
1000.0000 mg | ORAL_TABLET | Freq: Four times a day (QID) | ORAL | Status: DC
  Filled 2024-06-08: qty 2

## 2024-06-08 MED ORDER — SODIUM CHLORIDE 0.9 % IV SOLN: INTRAVENOUS | Status: DC

## 2024-06-08 MED ORDER — PHENYLEPHRINE 80 MCG/ML (10ML) SYRINGE FOR IV PUSH (FOR BLOOD PRESSURE SUPPORT)
PREFILLED_SYRINGE | INTRAVENOUS | Status: AC
  Filled 2024-06-08: qty 10

## 2024-06-08 MED ORDER — METHOCARBAMOL 500 MG PO TABS
500.0000 mg | ORAL_TABLET | Freq: Four times a day (QID) | ORAL | Status: DC | PRN
  Filled 2024-06-08: qty 1

## 2024-06-08 MED ORDER — MIDAZOLAM HCL (PF) 2 MG/2ML IJ SOLN: 0.5000 mg | Freq: Once | INTRAMUSCULAR | Status: DC | PRN

## 2024-06-08 MED ORDER — HYDRALAZINE HCL 20 MG/ML IJ SOLN
INTRAMUSCULAR | Status: AC
  Filled 2024-06-08: qty 1

## 2024-06-08 MED ORDER — ACETAMINOPHEN 160 MG/5ML PO SOLN
1000.0000 mg | Freq: Once | ORAL | Status: AC
  Administered 2024-06-08: 1000 mg via ORAL

## 2024-06-08 MED ORDER — DROPERIDOL 2.5 MG/ML IJ SOLN
0.6250 mg | Freq: Once | INTRAMUSCULAR | Status: AC
  Administered 2024-06-08: 0.625 mg via INTRAVENOUS

## 2024-06-08 MED ORDER — SODIUM CHLORIDE 0.9 % IR SOLN
Status: DC | PRN
  Administered 2024-06-08: 1

## 2024-06-08 MED ORDER — METOCLOPRAMIDE HCL 5 MG/ML IJ SOLN: 5.0000 mg | Freq: Three times a day (TID) | INTRAMUSCULAR | Status: DC | PRN

## 2024-06-08 MED ORDER — TRANEXAMIC ACID-NACL 1000-0.7 MG/100ML-% IV SOLN
1000.0000 mg | Freq: Once | INTRAVENOUS | Status: AC
  Administered 2024-06-08: 1000 mg via INTRAVENOUS
  Filled 2024-06-08: qty 100

## 2024-06-08 MED ORDER — LACTATED RINGERS IV BOLUS
250.0000 mL | Freq: Once | INTRAVENOUS | Status: DC
  Administered 2024-06-08: 250 mL via INTRAVENOUS

## 2024-06-08 MED ORDER — PHENOL 1.4 % MT LIQD: 1.0000 | OROMUCOSAL | Status: DC | PRN

## 2024-06-08 MED ORDER — OXYCODONE HCL 5 MG PO TABS: 10.0000 mg | ORAL_TABLET | Freq: Four times a day (QID) | ORAL | Status: DC | PRN

## 2024-06-08 MED ORDER — 0.9 % SODIUM CHLORIDE (POUR BTL) OPTIME
TOPICAL | Status: DC | PRN
  Administered 2024-06-08: 1000 mL

## 2024-06-08 MED ORDER — CEFAZOLIN SODIUM-DEXTROSE 2-4 GM/100ML-% IV SOLN
2.0000 g | INTRAVENOUS | Status: AC
  Administered 2024-06-08: 2 g via INTRAVENOUS
  Filled 2024-06-08: qty 100

## 2024-06-08 MED ORDER — MENTHOL 3 MG MT LOZG: 1.0000 | LOZENGE | OROMUCOSAL | Status: DC | PRN

## 2024-06-08 MED ORDER — MIDAZOLAM HCL 2 MG/2ML IJ SOLN
INTRAMUSCULAR | Status: AC
  Filled 2024-06-08: qty 2

## 2024-06-08 MED ORDER — BUPIVACAINE-MELOXICAM ER 400-12 MG/14ML IJ SOLN
INTRAMUSCULAR | Status: AC
  Filled 2024-06-08: qty 1

## 2024-06-08 MED ORDER — ONDANSETRON HCL 4 MG/2ML IJ SOLN
INTRAMUSCULAR | Status: AC
  Filled 2024-06-08: qty 2

## 2024-06-08 MED ORDER — LIDOCAINE 2% (20 MG/ML) 5 ML SYRINGE
INTRAMUSCULAR | Status: AC
  Filled 2024-06-08: qty 5

## 2024-06-08 MED ORDER — VANCOMYCIN HCL 1000 MG IV SOLR
INTRAVENOUS | Status: DC | PRN
  Administered 2024-06-08: 1000 mg

## 2024-06-08 MED ORDER — OXYCODONE HCL 5 MG/5ML PO SOLN: 5.0000 mg | Freq: Once | ORAL | Status: AC | PRN

## 2024-06-08 MED ORDER — HYDROMORPHONE HCL 1 MG/ML IJ SOLN: 1.0000 mg | Freq: Three times a day (TID) | INTRAMUSCULAR | Status: DC | PRN

## 2024-06-08 MED ORDER — PROPOFOL 10 MG/ML IV BOLUS
INTRAVENOUS | Status: DC | PRN
  Administered 2024-06-08: 100 mg via INTRAVENOUS
  Administered 2024-06-08: 125 ug/kg/min via INTRAVENOUS

## 2024-06-08 MED ORDER — BUPIVACAINE-MELOXICAM ER 400-12 MG/14ML IJ SOLN
INTRAMUSCULAR | Status: DC | PRN
  Administered 2024-06-08: 400 mg

## 2024-06-08 MED ORDER — KETOROLAC TROMETHAMINE 15 MG/ML IJ SOLN
INTRAMUSCULAR | Status: AC
  Filled 2024-06-08: qty 1

## 2024-06-08 MED ORDER — CEFAZOLIN SODIUM-DEXTROSE 2-4 GM/100ML-% IV SOLN
2.0000 g | Freq: Four times a day (QID) | INTRAVENOUS | Status: DC
  Administered 2024-06-08: 2 g via INTRAVENOUS
  Filled 2024-06-08: qty 100

## 2024-06-08 MED ORDER — PRONTOSAN WOUND IRRIGATION OPTIME
TOPICAL | Status: DC | PRN
  Administered 2024-06-08: 1

## 2024-06-08 MED ORDER — MEPIVACAINE HCL (PF) 2 % IJ SOLN: INTRAMUSCULAR | Status: DC | PRN

## 2024-06-08 MED ORDER — POVIDONE-IODINE 10 % EX SWAB
2.0000 | Freq: Once | CUTANEOUS | Status: AC
  Administered 2024-06-08: 2 via TOPICAL

## 2024-06-08 MED ORDER — OXYCODONE HCL 5 MG PO TABS: 5.0000 mg | ORAL_TABLET | Freq: Four times a day (QID) | ORAL | Status: DC | PRN

## 2024-06-08 MED ORDER — METHOCARBAMOL 1000 MG/10ML IJ SOLN: 500.0000 mg | Freq: Four times a day (QID) | INTRAMUSCULAR | Status: DC | PRN

## 2024-06-08 MED ORDER — LABETALOL HCL 5 MG/ML IV SOLN
INTRAVENOUS | Status: AC
  Filled 2024-06-08: qty 4

## 2024-06-08 MED ORDER — DROPERIDOL 2.5 MG/ML IJ SOLN
INTRAMUSCULAR | Status: AC
  Filled 2024-06-08: qty 2

## 2024-06-08 MED ORDER — ONDANSETRON HCL 4 MG/2ML IJ SOLN: 4.0000 mg | Freq: Four times a day (QID) | INTRAMUSCULAR | Status: DC | PRN

## 2024-06-08 MED ORDER — APIXABAN 2.5 MG PO TABS: 2.5000 mg | ORAL_TABLET | Freq: Two times a day (BID) | ORAL | Status: DC

## 2024-06-08 MED ORDER — KETOROLAC TROMETHAMINE 15 MG/ML IJ SOLN
15.0000 mg | Freq: Once | INTRAMUSCULAR | Status: AC
  Administered 2024-06-08: 15 mg via INTRAVENOUS

## 2024-06-08 MED ORDER — OXYCODONE HCL 5 MG PO TABS
ORAL_TABLET | ORAL | Status: AC
  Filled 2024-06-08: qty 1

## 2024-06-08 MED ORDER — METOCLOPRAMIDE HCL 5 MG PO TABS: 5.0000 mg | ORAL_TABLET | Freq: Three times a day (TID) | ORAL | Status: DC | PRN

## 2024-06-08 MED ORDER — CHLORHEXIDINE GLUCONATE 0.12 % MT SOLN
15.0000 mL | Freq: Once | OROMUCOSAL | Status: AC
  Administered 2024-06-08: 15 mL via OROMUCOSAL
  Filled 2024-06-08: qty 15

## 2024-06-08 MED ORDER — LABETALOL HCL 5 MG/ML IV SOLN
10.0000 mg | Freq: Once | INTRAVENOUS | Status: AC
  Administered 2024-06-08: 10 mg via INTRAVENOUS

## 2024-06-08 MED ORDER — ORAL CARE MOUTH RINSE: 15.0000 mL | Freq: Once | OROMUCOSAL | Status: AC

## 2024-06-08 SURGICAL SUPPLY — 65 items
ALCOHOL 70% 16 OZ (MISCELLANEOUS) ×1 IMPLANT
BAG COUNTER SPONGE SURGICOUNT (BAG) ×1 IMPLANT
BAG DECANTER FOR FLEXI CONT (MISCELLANEOUS) ×1 IMPLANT
BLADE SAG 18X100X1.27 (BLADE) ×1 IMPLANT
BLADE SAW SAG 90X13X1.27 (BLADE) ×1 IMPLANT
BLADE SAW SGTL 73X25 THK (BLADE) ×1 IMPLANT
BNDG COMPR ESMARK 6X3 LF (GAUZE/BANDAGES/DRESSINGS) IMPLANT
BOWL SMART MIX CTS (DISPOSABLE) IMPLANT
CLSR STERI-STRIP ANTIMIC 1/2X4 (GAUZE/BANDAGES/DRESSINGS) IMPLANT
COMPONENT FEM KNEE STD PS 7 LT (Joint) IMPLANT
COMPONENT PATELLA PEG 3 32 (Joint) IMPLANT
COOLER ICEMAN CLASSIC (MISCELLANEOUS) ×1 IMPLANT
COVER SURGICAL LIGHT HANDLE (MISCELLANEOUS) ×1 IMPLANT
CUFF TOURN SGL QUICK 42 (TOURNIQUET CUFF) IMPLANT
CUFF TRNQT CYL 34X4.125X (TOURNIQUET CUFF) ×1 IMPLANT
DERMABOND ADVANCED .7 DNX12 (GAUZE/BANDAGES/DRESSINGS) ×1 IMPLANT
DRAPE EXTREMITY T 121X128X90 (DISPOSABLE) ×1 IMPLANT
DRAPE HALF SHEET 40X57 (DRAPES) ×1 IMPLANT
DRAPE INCISE IOBAN 66X45 STRL (DRAPES) ×1 IMPLANT
DRAPE POUCH INSTRU U-SHP 10X18 (DRAPES) ×1 IMPLANT
DRAPE SURG ORHT 6 SPLT 77X108 (DRAPES) IMPLANT
DRAPE U-SHAPE 47X51 STRL (DRAPES) ×2 IMPLANT
DRSG AQUACEL AG ADV 3.5X10 (GAUZE/BANDAGES/DRESSINGS) ×1 IMPLANT
DURAPREP 26ML APPLICATOR (WOUND CARE) ×3 IMPLANT
ELECT CAUTERY BLADE 6.4 (BLADE) ×1 IMPLANT
ELECT PENCIL ROCKER SW 15FT (MISCELLANEOUS) ×1 IMPLANT
ELECTRODE REM PT RTRN 9FT ADLT (ELECTROSURGICAL) ×1 IMPLANT
GLOVE BIOGEL PI IND STRL 7.5 (GLOVE) ×1 IMPLANT
GLOVE BIOGEL PI MICRO STRL 7 (GLOVE) ×5 IMPLANT
GLOVE INDICATOR 7.0 STRL GRN (GLOVE) ×1 IMPLANT
GLOVE SURG SYN 7.5 PF PI (GLOVE) ×5 IMPLANT
GOWN STRL REUS W/ TWL LRG LVL3 (GOWN DISPOSABLE) ×2 IMPLANT
GOWN TOGA ZIPPER T7+ PEEL AWAY (MISCELLANEOUS) ×1 IMPLANT
HOOD PEEL AWAY T7 (MISCELLANEOUS) ×1 IMPLANT
IV 0.9% NACL 1000 ML (IV SOLUTION) ×1 IMPLANT
KIT BASIN OR (CUSTOM PROCEDURE TRAY) ×1 IMPLANT
KIT TURNOVER KIT B (KITS) ×1 IMPLANT
MANIFOLD NEPTUNE II (INSTRUMENTS) ×1 IMPLANT
MARKER SKIN DUAL TIP RULER LAB (MISCELLANEOUS) ×2 IMPLANT
NDL SPNL 18GX3.5 QUINCKE PK (NEEDLE) ×1 IMPLANT
NEEDLE SPNL 18GX3.5 QUINCKE PK (NEEDLE) ×1 IMPLANT
PACK TOTAL JOINT (CUSTOM PROCEDURE TRAY) ×1 IMPLANT
PAD ARMBOARD POSITIONER FOAM (MISCELLANEOUS) ×2 IMPLANT
PAD COLD SHLDR WRAP-ON (PAD) ×1 IMPLANT
PIN DRILL HDLS TROCAR 75 4PK (PIN) IMPLANT
PUTTY DBX 5CC (Putty) IMPLANT
SCREW FEMALE HEX FIX 25X2.5 (ORTHOPEDIC DISPOSABLE SUPPLIES) IMPLANT
SET HNDPC FAN SPRY TIP SCT (DISPOSABLE) ×1 IMPLANT
SOLN 0.9% NACL POUR BTL 1000ML (IV SOLUTION) ×1 IMPLANT
SOLUTION PRONTOSAN WOUND 350ML (IRRIGATION / IRRIGATOR) ×1 IMPLANT
STAPLER SKIN PROX 35W (STAPLE) IMPLANT
STEM TIB PERS SZ E 5D LT (Screw) IMPLANT
STEM TIBIAL SZ6-7 EF 12 LT (Knees) IMPLANT
SUCTION TUBE FRAZIER 10FR DISP (SUCTIONS) IMPLANT
SUT ETHILON 2 0 FS 18 (SUTURE) ×2 IMPLANT
SUT STRATAFIX PDS+ 0 24IN (SUTURE) ×1 IMPLANT
SUT VIC AB 0 CT1 27XBRD ANBCTR (SUTURE) ×1 IMPLANT
SUT VIC AB 1 CTX 27 (SUTURE) IMPLANT
SUT VIC AB 2-0 CT1 TAPERPNT 27 (SUTURE) ×3 IMPLANT
SYR 30ML LL (SYRINGE) ×2 IMPLANT
TOWEL GREEN STERILE (TOWEL DISPOSABLE) ×1 IMPLANT
TOWEL GREEN STERILE FF (TOWEL DISPOSABLE) ×1 IMPLANT
TUBE SUCT ARGYLE STRL (TUBING) ×1 IMPLANT
UNDERPAD 30X36 HEAVY ABSORB (UNDERPADS AND DIAPERS) ×1 IMPLANT
YANKAUER SUCT BULB TIP NO VENT (SUCTIONS) ×1 IMPLANT

## 2024-06-08 NOTE — Discharge Summary (Signed)
 "    Patient ID: [name] [name] MRN: [id number] DOB/AGE: [birth year] 44 y.o.  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    She was given perioperative antibiotics:  Anti-infectives (From admission, onward)    Start     Dose/Rate Route Frequency Ordered Stop   06/08/24 1400  ceFAZolin  (ANCEF ) IVPB 2g/100 mL premix        2 g 200 mL/hr over 30 Minutes Intravenous Every 6 hours 06/08/24 1042 06/09/24 0159   06/08/24 0801  vancomycin  (VANCOCIN ) powder  Status:  Discontinued          As needed 06/08/24 0802 06/08/24 0911   06/08/24 0600  ceFAZolin  (ANCEF ) IVPB 2g/100 mL premix        2 g 200 mL/hr over 30 Minutes Intravenous On call to O.R. 06/08/24 9450 06/08/24 0736     .  She was given sequential compression devices, early ambulation, and appropriate chemoprophylaxis for DVT prophylaxis.  She benefited maximally from the hospital stay and there were no complications.    Recent vital signs:  Vitals:   06/08/24 1145 06/08/24 1204  BP: (!) 170/103 (!) 170/95  Pulse: 81 83  Resp: 15 19  Temp: 98 F (36.7 C) 98 F (36.7 C)  SpO2: 98% 100%    Recent laboratory  studies:  Lab Results  Component Value Date   HGB 11.1 (L) 06/02/2024   HGB 11.2 (L) 10/28/2023   HGB 13.0 10/24/2021   Lab Results  Component Value Date   WBC 11.4 (H) 06/02/2024   PLT 540 (H) 06/02/2024   Lab Results  Component Value Date   INR 0.8 08/27/2019   Lab Results  Component Value Date   NA 137 06/02/2024   K 3.9 06/02/2024   CL 105 06/02/2024   CO2 21 (L) 06/02/2024   BUN 14 06/02/2024   CREATININE 0.74 06/02/2024   GLUCOSE 104 (H) 06/02/2024    Discharge Medications:   Allergies as of 06/08/2024       Reactions   Toradol  [ketorolac  Tromethamine ] Nausea And Vomiting        Medication List     STOP taking these medications    acetaminophen  500 MG tablet Commonly known as: TYLENOL    acetaminophen -codeine  300-30 MG tablet Commonly known as: TYLENOL  #3   Advil 200 MG tablet Generic drug: ibuprofen   ondansetron  4 MG disintegrating tablet Commonly known as: ZOFRAN -ODT   traMADol  50 MG tablet Commonly known as: ULTRAM        TAKE these medications    amLODipine-benazepril 10-20 MG capsule Commonly known as: LOTREL Take 1 capsule by mouth daily.   aspirin  81 MG chewable tablet Commonly known as: Aspirin   81 Chew 1 tablet (81 mg total) by mouth 2 (two) times daily. To be taken after surgery to prevent blood clots   buPROPion  300 MG 24 hr tablet Commonly known as: WELLBUTRIN  XL Take 300 mg by mouth daily.   docusate sodium  100 MG capsule Commonly known as: Colace Take 1 capsule (100 mg total) by mouth daily as needed.   metFORMIN 500 MG 24 hr tablet Commonly known as: GLUCOPHAGE-XR Take 1,000 mg by mouth daily. Takes for weight loss   methocarbamol  750 MG tablet Commonly known as: Robaxin -750 Take 1 tablet (750 mg total) by mouth 2 (two) times daily as needed for muscle spasms. What changed: Another medication with the same name was removed. Continue taking this medication, and follow the directions you see here.   methylphenidate   54 MG CR tablet Commonly known as: CONCERTA  Take 54 mg by mouth daily.   ondansetron  4 MG tablet Commonly known as: ZOFRAN  TAKE 1 TABLET(4 MG) BY MOUTH EVERY 8 HOURS AS NEEDED FOR NAUSEA OR VOMITING   oxyCODONE -acetaminophen  5-325 MG tablet Commonly known as: PERCOCET/ROXICET Take 1 tablet by mouth every 4 (four) hours as needed for severe pain (pain score 7-10).   topiramate  25 MG tablet Commonly known as: TOPAMAX  Take 50 mg by mouth at bedtime. For weight loss               Durable Medical Equipment  (From admission, onward)           Start     Ordered   06/08/24 0733  DME Walker rolling  Once       Question Answer Comment  Walker: With 5 Inch Wheels   Patient needs a walker to treat with the following condition Status post left partial knee replacement      06/08/24 0732   06/08/24 0733  DME 3 n 1  Once        06/08/24 0732   06/08/24 0733  DME Bedside commode  Once       Question:  Patient needs a bedside commode to treat with the following condition  Answer:  Status post left partial knee replacement   06/08/24 0732            Diagnostic Studies: No results found.  Disposition: Discharge disposition: 01-Home or Self Care       Discharge Instructions     Call MD / Call 911   Complete by: As directed    If you experience chest pain or shortness of breath, CALL 911 and be transported to the hospital emergency room.  If you develope a fever above 101.5 F, pus (white drainage) or increased drainage or redness at the wound, or calf pain, call your surgeon's office.   Constipation Prevention   Complete by: As directed    Drink plenty of fluids.  Prune juice may be helpful.  You may use a stool softener, such as Colace (over the counter) 100 mg twice a day.  Use MiraLax  (over the counter) for constipation as needed.   Driving restrictions   Complete by: As directed    No driving while taking narcotic pain meds.   Increase activity slowly as tolerated    Complete by: As directed    Post-operative opioid taper instructions:   Complete by: As directed    POST-OPERATIVE OPIOID TAPER INSTRUCTIONS: It is important to wean off of your opioid medication as soon as possible. If you do not need pain medication after your surgery it is ok  to stop day one. Opioids include: Codeine , Hydrocodone (Norco, Vicodin), Oxycodone (Percocet, oxycontin ) and hydromorphone  amongst others.  Long term and even short term use of opiods can cause: Increased pain response Dependence Constipation Depression Respiratory depression And more.  Withdrawal symptoms can include Flu like symptoms Nausea, vomiting And more Techniques to manage these symptoms Hydrate well Eat regular healthy meals Stay active Use relaxation techniques(deep breathing, meditating, yoga) Do Not substitute Alcohol to help with tapering If you have been on opioids for less than two weeks and do not have pain than it is ok to stop all together.  Plan to wean off of opioids This plan should start within one week post op of your joint replacement. Maintain the same interval or time between taking each dose and first decrease the dose.  Cut the total daily intake of opioids by one tablet each day Next start to increase the time between doses. The last dose that should be eliminated is the evening dose.           Follow-up Information     Jule Ronal CROME, PA-C. Schedule an appointment as soon as possible for a visit in 2 week(s).   Specialty: Orthopedic Surgery Contact information: [address] [place]  [place] [address] [phone]         [facility] Follow up.   Why: [facility] will contact you for the first home visit. Contact information: [phone]                 Signed: Ozell Cummins 06/08/2024, 1:48 PM  "

## 2024-06-08 NOTE — Plan of Care (Signed)
  Problem: Education: Goal: Knowledge of General Education information will improve Description: Including pain rating scale, medication(s)/side effects and non-pharmacologic comfort measures Outcome: Completed/Met   Problem: Health Behavior/Discharge Planning: Goal: Ability to manage health-related needs will improve Outcome: Completed/Met   Problem: Clinical Measurements: Goal: Ability to maintain clinical measurements within normal limits will improve Outcome: Completed/Met Goal: Will remain free from infection Outcome: Completed/Met Goal: Diagnostic test results will improve Outcome: Completed/Met Goal: Respiratory complications will improve Outcome: Completed/Met Goal: Cardiovascular complication will be avoided Outcome: Completed/Met   Problem: Activity: Goal: Risk for activity intolerance will decrease Outcome: Completed/Met   Problem: Nutrition: Goal: Adequate nutrition will be maintained Outcome: Completed/Met   Problem: Coping: Goal: Level of anxiety will decrease Outcome: Completed/Met   Problem: Elimination: Goal: Will not experience complications related to bowel motility Outcome: Completed/Met Goal: Will not experience complications related to urinary retention Outcome: Completed/Met   Problem: Pain Managment: Goal: General experience of comfort will improve and/or be controlled Outcome: Completed/Met   Problem: Safety: Goal: Ability to remain free from injury will improve Outcome: Completed/Met   Problem: Skin Integrity: Goal: Risk for impaired skin integrity will decrease Outcome: Completed/Met   Problem: Education: Goal: Knowledge of the prescribed therapeutic regimen will improve Outcome: Completed/Met Goal: Individualized Educational Video(s) Outcome: Completed/Met   Problem: Activity: Goal: Ability to avoid complications of mobility impairment will improve Outcome: Completed/Met Goal: Range of joint motion will improve Outcome:  Completed/Met   Problem: Clinical Measurements: Goal: Postoperative complications will be avoided or minimized Outcome: Completed/Met   Problem: Pain Management: Goal: Pain level will decrease with appropriate interventions Outcome: Completed/Met   Problem: Skin Integrity: Goal: Will show signs of wound healing Outcome: Completed/Met

## 2024-06-08 NOTE — Progress Notes (Signed)
 Orthopedic Tech Progress Note Patient Details:  [name] [name] 08/30/1980 [id number]  Ortho Devices Type of Ortho Device: Bone foam zero knee Ortho Device/Splint Location: LLE Ortho Device/Splint Interventions: Ordered, Application   Post Interventions Patient Tolerated: Well Instructions Provided: Care of device  Delanna LITTIE Pac 06/08/2024, 9:52 AM

## 2024-06-08 NOTE — Transfer of Care (Signed)
 Immediate Anesthesia Transfer of Care Note  Patient: [name] [name]  Procedure(s) Performed: ARTHROPLASTY, KNEE, TOTAL (Left: Knee)  Patient Location: PACU  Anesthesia Type:MAC, Regional, and Spinal  Level of Consciousness: awake and alert   Airway & Oxygen Therapy: Patient Spontanous Breathing and Patient connected to face mask oxygen  Post-op Assessment: Report given to RN and Post -op Vital signs reviewed and stable  Post vital signs: Reviewed and stable  Last Vitals:  Vitals Value Taken Time  BP 119/87 06/08/24 09:15  Temp    Pulse 78 06/08/24 09:16  Resp 0 06/08/24 09:15  SpO2 99 % 06/08/24 09:16  Vitals shown include unfiled device data.  Last Pain:  Vitals:   06/08/24 0606  TempSrc: Oral  PainSc: 8       Patients Stated Pain Goal: 3 (06/08/24 0606)  Complications: No notable events documented.

## 2024-06-08 NOTE — Telephone Encounter (Signed)
 Pharmacy Patient Advocate Encounter  Insurance verification completed.    The patient is insured through [facility]. Patient has [employer], may use a copay card, and/or apply for patient assistance if available.    Ran test claim for Eliquis  2.5mg  tablet and the current 30 day co-pay is $80.   This test claim was processed through [facility]- copay amounts may vary at other pharmacies due to [employer], or as the patient moves through the different stages of their insurance plan.

## 2024-06-08 NOTE — Progress Notes (Signed)
 OT Cancellation Note  Patient Details Name: [name] [name] MRN: [id number] DOB: 1981/06/18   Cancelled Treatment:    Reason Eval/Treat Not Completed: OT screened, no needs identified, will sign off.   [name] [name] FREDERICK, OTR/L [facility] Acute Rehabilitation Office: [phone]   [name] [name] 06/08/2024, 2:51 PM

## 2024-06-08 NOTE — H&P (Signed)
 "   PREOPERATIVE H&P  Chief Complaint: Post-traumatic osteoarthritis of left knee  HPI: [name] [name] is a 43 y.o. female who presents for surgical treatment of Post-traumatic osteoarthritis of left knee.  She denies any changes in medical history.  Past Surgical History:  Procedure Laterality Date   TOTAL HIP ARTHROPLASTY Right 08/31/2019   Procedure: RIGHT TOTAL HIP ARTHROPLASTY ANTERIOR APPROACH;  Surgeon: Jerri Kay HERO, MD;  Location: [facility] OR;  Service: Orthopedics;  Laterality: Right;   TOTAL HIP ARTHROPLASTY Left 11/04/2023   Procedure: ARTHROPLASTY, HIP, TOTAL, ANTERIOR APPROACH;  Surgeon: Jerri Kay HERO, MD;  Location: [facility] OR;  Service: Orthopedics;  Laterality: Left;   WISDOM TOOTH EXTRACTION     Social History   Socioeconomic History   Marital status: Divorced    Spouse name: Kenika Sahm   Number of children: 0   Years of education: 15   Highest education level: Not on file  Occupational History   Occupation: office manager    Comment: [employer]   Occupation: arboriculturist    Comment: beer blog for [employer]  Tobacco Use   Smoking status: Former    Current packs/day: 0.00    Average packs/day: 1 pack/day for 23.0 years (23.0 ttl pk-yrs)    Types: Cigarettes    Start date: 05/28/1994    Quit date: 05/28/2017    Years since quitting: 7.0   Smokeless tobacco: Never  Vaping Use   Vaping status: Never Used  Substance and Sexual Activity   Alcohol use: Never    Comment: 2 drinks per day   Drug use: No   Sexual activity: Yes    Partners: Male    Birth control/protection: None  Other Topics Concern   Not on file  Social History Narrative   Lives with her husband.   Mother and grandfather live nearby.   Social Drivers of Health   Tobacco Use: Medium Risk (06/08/2024)   Patient History    Smoking Tobacco Use: Former    Smokeless Tobacco Use: Never    Passive Exposure: Not on file  Financial Resource Strain: Not on file  Food Insecurity:  No Food Insecurity (02/07/2022)   Received from [facility] visits prior to [facility]18[facility]22[facility]   Epic    Within the past 12 months, you worried that your food would run out before you got the money to buy more.: Never true    Within the past 12 months, the food you bought just didn't last and you didn't have money to get more.: Never true  Transportation Needs: No Transportation Needs (02/07/2022)   [facility] prior to 08/18/2022., [facility]   PRAPARE - Transportation    Lack of Transportation (Medical): No    Lack of Transportation (Non-Medical): No  Physical Activity: Not on file  Stress: Not on file  Social Connections: Not on file  Depression (EYV7-0): Not on file  Alcohol Screen: Not on file  Housing: Not on file  Utilities: Not At Risk (02/07/2022)   Received from [facility] visits prior to 08/18/2022.   [facility] Utilities    Threatened with loss of utilities: No  Health Literacy: Not on file   Family History  Problem Relation Age of Onset   Hypertension Father    Heart disease Father    Healthy Mother    Allergies  Allergen Reactions   Toradol  [Ketorolac  Tromethamine ] Nausea And Vomiting   Prior to Admission  medications  Medication Sig Start Date End Date Taking? Authorizing Provider  acetaminophen  (TYLENOL ) 500 MG tablet Take 1,500 mg by mouth every 6 (six) hours as needed (pain).   Yes [provider]  amLODipine-benazepril (LOTREL) 10-20 MG capsule Take 1 capsule by mouth daily.   Yes [provider]  buPROPion  (WELLBUTRIN  XL) 300 MG 24 hr tablet Take 300 mg by mouth daily.   Yes [provider]  docusate sodium  (COLACE) 100 MG capsule Take 1 capsule (100 mg total) by mouth daily as needed. Patient not taking: Reported on 06/02/2024 05/27/24 05/27/25  Jule Ronal CROME, PA-C  ibuprofen (ADVIL) 200 MG tablet Take 600 mg by mouth every 6 (six) hours  as needed (pain).   Yes [provider]  metFORMIN (GLUCOPHAGE-XR) 500 MG 24 hr tablet Take 1,000 mg by mouth daily. Takes for weight loss 10/10/23  Yes [provider]  methocarbamol  (ROBAXIN ) 500 MG tablet TAKE 1 TABLET(500 MG) BY MOUTH EVERY 6 HOURS AS NEEDED FOR MUSCLE SPASMS 05/18/24  Yes Jerri Kay HERO, MD  methocarbamol  (ROBAXIN -750) 750 MG tablet Take 1 tablet (750 mg total) by mouth 2 (two) times daily as needed for muscle spasms. 05/27/24  Yes Jule Ronal CROME, PA-C  methylphenidate  54 MG PO CR tablet Take 54 mg by mouth daily.   Yes [provider]  ondansetron  (ZOFRAN -ODT) 4 MG disintegrating tablet Take 4 mg by mouth every 8 (eight) hours as needed for nausea or vomiting.   Yes [provider]  topiramate  (TOPAMAX ) 25 MG tablet Take 50 mg by mouth at bedtime. For weight loss   Yes [provider]  traMADol  (ULTRAM ) 50 MG tablet Take 50 mg by mouth 2 (two) times daily as needed (pain).   Yes [provider]  acetaminophen -codeine  (TYLENOL  #3) 300-30 MG tablet Take 1 tablet by mouth 2 (two) times daily as needed for moderate pain (pain score 4-6). 06/05/24   Jule Ronal CROME, PA-C  aspirin  (ASPIRIN  81) 81 MG chewable tablet Chew 1 tablet (81 mg total) by mouth 2 (two) times daily. To be taken after surgery to prevent blood clots Patient not taking: Reported on 06/02/2024 05/27/24   Jule Ronal CROME, PA-C  ondansetron  (ZOFRAN ) 4 MG tablet TAKE 1 TABLET(4 MG) BY MOUTH EVERY 8 HOURS AS NEEDED FOR NAUSEA OR VOMITING 06/06/24   Jule Ronal CROME, PA-C  oxyCODONE -acetaminophen  (PERCOCET/ROXICET) 5-325 MG tablet Take 1 tablet by mouth every 4 (four) hours as needed for severe pain (pain score 7-10). Patient not taking: Reported on 06/02/2024    [provider]     Positive ROS: All other systems have been reviewed and were otherwise negative with the exception of those mentioned in the HPI and as above.  Physical Exam: General: Alert,  no acute distress Cardiovascular: No pedal edema Respiratory: No cyanosis, no use of accessory musculature GI: abdomen soft Skin: No lesions in the area of chief complaint Neurologic: Sensation intact distally Psychiatric: Patient is competent for consent with normal mood and affect Lymphatic: no lymphedema  MUSCULOSKELETAL: exam stable  Assessment: Post-traumatic osteoarthritis of left knee  Plan: Plan for Procedures: ARTHROPLASTY, KNEE, TOTAL  The risks benefits and alternatives were discussed with the patient including but not limited to the risks of nonoperative treatment, versus surgical intervention including infection, bleeding, nerve injury,  blood clots, cardiopulmonary complications, morbidity, mortality, among others, and they were willing to proceed.   Ozell Jerri, MD 06/08/2024 5:52 AM  "

## 2024-06-08 NOTE — Discharge Instructions (Addendum)

## 2024-06-08 NOTE — Evaluation (Signed)
 Physical Therapy Evaluation Patient Details Name: [name] [name] MRN: [id number] DOB: Nov 14, 1980 Today's Date: 06/08/2024  History of Present Illness  43 y.o. female presents to [facility] hospital on 11/04/2023 for elective L TKA. PMH includes ADHD, R THA, L THA.  Clinical Impression  Pt presents to PT with deficits in functional mobility, gait, balance, strength, ROM. Pt is able to ambulate for household distances with support of the RW. Pt is also able to ascend 3 steps with hand hold to simulate cane use. PT provides education on the TKR exercise packet and encourages frequent mobilization with staff assistance. PT will follow up tomorrow if the pt remains admitted, however the pt has demonstrated the ability to perform all mobility necessary within the home and has adequate caregiver support from her fiance to discharge today.         If plan is discharge home, recommend the following: A little help with bathing/dressing/bathroom;Assistance with cooking/housework;Assist for transportation;Help with stairs or ramp for entrance   Can travel by private vehicle        Equipment Recommendations None recommended by PT  Recommendations for Other Services       Functional Status Assessment Patient has had a recent decline in their functional status and demonstrates the ability to make significant improvements in function in a reasonable and predictable amount of time.     Precautions / Restrictions Precautions Precautions: Fall;Knee Precaution Booklet Issued: Yes (comment) Recall of Precautions/Restrictions: Intact Restrictions Weight Bearing Restrictions Per Provider Order: Yes LLE Weight Bearing Per Provider Order: Weight bearing as tolerated      Mobility  Bed Mobility Overal bed mobility: Needs Assistance Bed Mobility: Supine to Sit     Supine to sit: Supervision     General bed mobility comments: increased time, utilizing UE to assist LLE    Transfers Overall transfer  level: Needs assistance Equipment used: Rolling walker (2 wheels) Transfers: Sit to/from Stand Sit to Stand: Supervision                Ambulation/Gait Ambulation/Gait assistance: Supervision Gait Distance (Feet): 100 Feet Assistive device: Rolling walker (2 wheels) Gait Pattern/deviations: Step-to pattern Gait velocity: reduced Gait velocity interpretation: <1.8 ft/sec, indicate of risk for recurrent falls   General Gait Details: slowed step-to gait, reduced stance time on LLE  Stairs Stairs: Yes Stairs assistance: Min assist Stair Management: No rails, Step to pattern, Forwards (hand hold to simulate cane) Number of Stairs: 3    Wheelchair Mobility     Tilt Bed    Modified Rankin (Stroke Patients Only)       Balance Overall balance assessment: Needs assistance Sitting-balance support: No upper extremity supported, Feet supported Sitting balance-Leahy Scale: Good     Standing balance support: Single extremity supported, Reliant on assistive device for balance Standing balance-Leahy Scale: Poor                               Pertinent Vitals/Pain Pain Assessment Pain Assessment: 0-10 Pain Score: 9  Pain Location: L knee Pain Descriptors / Indicators: Aching Pain Intervention(s): Monitored during session    Home Living Family/patient expects to be discharged to:: Private residence Living Arrangements: Spouse/significant other Available Help at Discharge: Other (Comment) (fiance 24/7) Type of Home: House Home Access: Stairs to enter Entrance Stairs-Rails: None (rails to be installed soon) Secretary/administrator of Steps: 3   Home Layout: One level Home Equipment: Agricultural Consultant (2 wheels);Cane - single point;Wheelchair -  manual      Prior Function Prior Level of Function : Independent/Modified Independent;Working/employed;Driving             Mobility Comments: working as a leisure centre manager at [employer]       Extremity/Trunk  Assessment   Upper Extremity Assessment Upper Extremity Assessment: Overall WFL for tasks assessed    Lower Extremity Assessment Lower Extremity Assessment: LLE deficits/detail LLE Deficits / Details: generalized post-op weankness and ROM deficits as anticipated on POD 0    Cervical / Trunk Assessment Cervical / Trunk Assessment: Normal  Communication   Communication Communication: No apparent difficulties    Cognition Arousal: Alert Behavior During Therapy: WFL for tasks assessed/performed   PT - Cognitive impairments: No apparent impairments                         Following commands: Intact       Cueing Cueing Techniques: Verbal cues     General Comments General comments (skin integrity, edema, etc.): VSS on RA    Exercises Other Exercises Other Exercises: PT provides education on TKR exercise packet   Assessment/Plan    PT Assessment Patient needs continued PT services  PT Problem List Decreased strength;Decreased range of motion;Decreased activity tolerance;Decreased balance;Decreased mobility;Decreased knowledge of use of DME;Pain       PT Treatment Interventions DME instruction;Gait training;Stair training;Functional mobility training;Therapeutic activities;Balance training;Therapeutic exercise;Neuromuscular re-education;Patient/family education    PT Goals (Current goals can be found in the Care Plan section)  Acute Rehab PT Goals Patient Stated Goal: to return to independence PT Goal Formulation: With patient Time For Goal Achievement: 06/12/24 Potential to Achieve Goals: Good    Frequency 7X/week     Co-evaluation               AM-PAC PT 6 Clicks Mobility  Outcome Measure Help needed turning from your back to your side while in a flat bed without using bedrails?: A Little Help needed moving from lying on your back to sitting on the side of a flat bed without using bedrails?: A Little Help needed moving to and from a bed to a  chair (including a wheelchair)?: A Little Help needed standing up from a chair using your arms (e.g., wheelchair or bedside chair)?: A Little Help needed to walk in hospital room?: A Little Help needed climbing 3-5 steps with a railing? : A Little 6 Click Score: 18    End of Session Equipment Utilized During Treatment: Gait belt Activity Tolerance: Patient tolerated treatment well Patient left: in chair;with call bell/phone within reach;with family/visitor present Nurse Communication: Mobility status PT Visit Diagnosis: Other abnormalities of gait and mobility (R26.89);Pain Pain - Right/Left: Left Pain - part of body: Knee    Time: 8781-8754 PT Time Calculation (min) (ACUTE ONLY): 27 min   Charges:   PT Evaluation $PT Eval Low Complexity: 1 Low   PT General Charges $$ ACUTE PT VISIT: 1 Visit         Bernardino JINNY Ruth, PT, DPT Acute Rehabilitation Office [phone]   Bernardino JINNY Ruth 06/08/2024, 1:00 PM

## 2024-06-08 NOTE — Progress Notes (Signed)
 Patient alert and oriented, mae's well, voiding adequate amount of urine, swallowing without difficulty, no c/o pain at time of discharge. Patient discharged home with family. Script and discharged instructions given to patient. Patient and family stated understanding of instructions given. Room was checked and accounted for all patient's belongings; discharge instructions concerning his medications, incision care, follow up appointment and when to call the doctor as needed were all discussed with patient by RN and she expressed understanding on the instructions given

## 2024-06-08 NOTE — Anesthesia Procedure Notes (Signed)
 Spinal  Patient location during procedure: OR End time: 06/08/2024 7:29 AM Reason for block: surgical anesthesia  Staffing Performed: anesthesiologist  Authorized by: Leonce Athens, MD   Performed by: Leonce Athens, MD  Preanesthetic Checklist Completed: patient identified, IV checked, site marked, risks and benefits discussed, surgical consent, monitors and equipment checked, pre-op evaluation and timeout performed Spinal Block Patient position: sitting Prep: DuraPrep Patient monitoring: heart rate, cardiac monitor, continuous pulse ox and blood pressure Approach: midline Location: L3-4 Injection technique: single-shot Needle Needle type: Pencan  Needle gauge: 24 G Needle length: 9 cm Assessment Sensory level: T4 Events: CSF return  Additional Notes Pt identified in Operating room.  Monitors applied. Working IV access confirmed. Sterile prep, drape lumbar spine.  1% lido local L 3,4.  #24ga Pencan into clear CSF L 3,4.  60mg  2% Mepivacaine   injected with asp CSF beginning and end of injection.  Patient asymptomatic, VSS, no heme aspirated, tolerated well.  JAYSON Leonce, MD

## 2024-06-08 NOTE — Anesthesia Procedure Notes (Signed)
 Anesthesia Regional Block: Adductor canal block   Pre-Anesthetic Checklist: , timeout performed,  Correct Patient, Correct Site, Correct Laterality,  Correct Procedure, Correct Position, site marked,  Risks and benefits discussed,  Surgical consent,  Pre-op evaluation,  At surgeon's request and post-op pain management  Laterality: Left and Lower  Prep: chloraprep       Needles:  Injection technique: Single-shot  Needle Type: Echogenic Needle     Needle Length: 9cm  Needle Gauge: 21     Additional Needles:   Procedures:,,,, ultrasound used (permanent image in chart),,    Narrative:  Start time: 06/08/2024 6:42 AM End time: 06/08/2024 6:49 AM Injection made incrementally with aspirations every 5 mL.  Performed by: Personally  Anesthesiologist: Leonce Athens, MD  Additional Notes: Pt identified in Holding room.  Monitors applied. Working IV access confirmed. Timeout, Sterile prep L thigh.  #21ga ECHOgenic Arrow block needle into adductor canal with US  guidance.  20cc 0.75% Ropivacaine  injected incrementally after negative test dose.  Patient asymptomatic, VSS, no heme aspirated, tolerated well.   JAYSON Leonce, MD

## 2024-06-08 NOTE — Op Note (Signed)
 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 CPT [id number]  Surgeon: N. Ozell Cummins, MD  Assist: Ronal Morna Grave, PA-C; necessary for the timely completion of procedure and due to complexity of procedure.  Anesthesia: Spinal, regional  Tourniquet time: see anesthesia record  Implants used: Zimmer persona press fit Femur: CR 7 Tibia: E Patella: 32 mm Polyethylene: 12 mm medial congruent  Indication: [name] [name] is a 43 y.o. year old female with a history of knee pain. Having failed conservative management, the patient elected to proceed with a total knee arthroplasty.  We have reviewed the risk and benefits of the surgery and they elected to proceed after voicing understanding.  Procedure:  After informed consent was obtained and understanding of the risk were voiced including but not limited to bleeding, infection, damage to surrounding structures including nerves and vessels, blood clots, leg length inequality and the failure to achieve desired results, the operative extremity was marked with verbal confirmation of the patient in the holding area.   The patient was then brought to the operating room and transported to the operating room table in the supine position.  A tourniquet was applied to the operative extremity around the upper thigh. The operative limb was then prepped and draped in the usual sterile fashion and preoperative antibiotics were administered.  A time out was performed prior to the start of surgery confirming the correct extremity, preoperative antibiotic administration, as well as team members, implants and instruments available for the case. Correct surgical site  was also confirmed with preoperative radiographs. The limb was then elevated for exsanguination and the tourniquet was inflated. A midline incision was made and a standard medial parapatellar approach was performed.  The patella was everted.  The patella was resected down to 14 mm and sized to a 32 mm.  Bone quality was excellent.  A cover was placed on the patella for protection from retractors.  We then turned our attention to the femur.  The ACL was sacrificed. Start site was drilled in the femur and the intramedullary distal femoral cutting guide was placed, set at 5 degrees valgus, taking 10 mm of distal resection. The distal cut was made. Osteophytes were then removed.   Next, the proximal tibial cutting guide was placed with appropriate slope, varus/valgus alignment and depth of resection. A drop rod was attached to confirm that it was pointed to the second metatarsal.  The proximal tibial cut was made taking 4 mm off the low medial side. Gap blocks were then used to assess the extension gap and alignment, and appropriate soft tissue releases were performed. Attention was turned back to the femur, which was sized using the sizing guide to a size 7 standard. Appropriate rotation of the femoral component was determined using epicondylar axis, Whitesides line, and assessing the flexion gap under ligament tension. The appropriate size 4-in-1 cutting block was placed and cuts were made.  There was a 1 cm contained cystic defect of the distal posterior aspect of the lateral femoral condyle.  This was removed back to bleeding bone with a rongeur.  Posterior femoral osteophytes and uncapped bone were then removed with the curved osteotome.  Trial components were placed, and stability was checked in full extension, mid-flexion, and deep flexion.  The PCL  was retained.  The patella tracked well without a lateral release. Trial components were then removed and tibial preparation performed.  The tibial trial was  pointed to the medial third of the tibial tubercle.  The tibia was sized for a size E component and prepped.  Trial components were removed.   The bony surfaces were irrigated with a pulse lavage and then dried. The lateral femoral condyle defect was bone grafted.  Final components were placed.  The final polyethylene liner, 12 mm thick, was inserted and checked to ensure the locking mechanism had engaged appropriately.  The stability of the construct was re-evaluated throughout a range of motion and found to be acceptable.  The tourniquet was deflated and hemostasis was achieved. The wound was irrigated with normal saline.  One gram of vancomycin  powder was placed in the surgical bed.  Topical mixture of 0.25% bupivacaine  and meloxicam  was placed in the joint for postoperative pain.  Capsular closure was performed with a #1 statafix in flexion, subcutaneous fat closed with a 0 vicryl suture, then subcutaneous tissue closed with interrupted 2.0 vicryl suture. The skin was then closed with a 2.0 nylon and dermabond. A sterile dressing was applied.  The patient was awakened in the operating room and taken to recovery in stable condition. All sponge, needle, and instrument counts were correct at the end of the case.  Morna Grave was necessary for opening, closing, retracting, limb positioning and overall facilitation and completion of the surgery.  Position: supine  Complications: none.  Time Out: performed  Drains/Packing: none Estimated blood loss: minimal Returned to Recovery Room: in good condition.   Mechanical VTE (DVT) Prophylaxis: sequential compression devices, TED thigh-high  Chemical VTE (DVT) Prophylaxis: aspirin  POD 0  Fluid Replacement  Crystalloid: see anesthesia record Blood: none  FFP: none   Specimens Removed: 1 to pathology  Sponge and Instrument Count Correct? yes  PACU: portable radiograph - knee AP and Lateral  Plan/RTC: Return in 2 weeks for suture removal.  Weight  Bearing/Load Lower Extremity: full   Implant Name Type Inv. Item Serial No. Manufacturer Lot No. LRB No. Used Action  PUTTY DBX 5CC - [id number] Putty PUTTY DBX 5CC [id number] MUSCULOSKELETL TRANSPLANT FNDN  Left 1 Implanted  STEM TIB PERS SZ E 5D LT - [id number] Screw STEM TIB PERS SZ E 5D LT  ZIMMER RECON(ORTH,TRAU,BIO,SG) [id number] Left 1 Implanted  COMPONENT PATELLA PEG 3 32 - [id number] Joint COMPONENT PATELLA PEG 3 32  ZIMMER [employer]ON(ORTH,TRAU,BIO,SG) [id number] Left 1 Implanted  STEM TIBIAL SZ6-7 EF 12 LT - [id number] Knees STEM TIBIAL SZ6-7 EF 12 LT  ZIMMER [employer]ON(ORTH,TRAU,BIO,SG) [id number] Left 1 Implanted  COMPONENT FEM KNEE STD PS 7 LT - [id number] Joint COMPONENT FEM KNEE STD PS 7 LT  ZIMMER [employer]ON(ORTH,TRAU,BIO,SG) [id number] Left 1 Implanted    N. Ozell Cummins, MD [facility] 8:40 AM  "

## 2024-06-09 ENCOUNTER — Encounter (HOSPITAL_COMMUNITY): Payer: Self-pay | Admitting: Orthopaedic Surgery

## 2024-06-09 NOTE — Anesthesia Postprocedure Evaluation (Signed)
"   Anesthesia Post Note  Patient: [name] [name]  Procedure(s) Performed: ARTHROPLASTY, KNEE, TOTAL (Left: Knee)     Patient location during evaluation: PACU Anesthesia Type: Spinal and MAC Level of consciousness: awake and alert Pain management: pain level controlled Vital Signs Assessment: post-procedure vital signs reviewed and stable Respiratory status: spontaneous breathing, nonlabored ventilation, respiratory function stable and patient connected to nasal cannula oxygen Cardiovascular status: blood pressure returned to baseline and stable Postop Assessment: no apparent nausea or vomiting Anesthetic complications: no   No notable events documented.  Last Vitals:  Vitals:   06/08/24 1145 06/08/24 1204  BP: (!) 170/103 (!) 170/95  Pulse: 81 83  Resp: 15 19  Temp: 36.7 C 36.7 C  SpO2: 98% 100%    Last Pain:  Vitals:   06/08/24 1054  TempSrc:   PainSc: 9                  [name]      "

## 2024-06-12 ENCOUNTER — Encounter: Payer: Self-pay | Admitting: Orthopaedic Surgery

## 2024-06-12 MED ORDER — OXYCODONE-ACETAMINOPHEN 5-325 MG PO TABS: 1.0000 | ORAL_TABLET | ORAL | 0 refills | Status: DC | PRN

## 2024-06-12 MED ORDER — CELECOXIB 200 MG PO CAPS: 200.0000 mg | ORAL_CAPSULE | Freq: Two times a day (BID) | ORAL | 3 refills | Status: AC

## 2024-06-16 ENCOUNTER — Other Ambulatory Visit: Payer: Self-pay | Admitting: Physician Assistant

## 2024-06-16 ENCOUNTER — Encounter: Payer: Self-pay | Admitting: Orthopaedic Surgery

## 2024-06-16 MED ORDER — OXYCODONE-ACETAMINOPHEN 5-325 MG PO TABS: 1.0000 | ORAL_TABLET | Freq: Four times a day (QID) | ORAL | 0 refills | Status: DC | PRN

## 2024-06-19 ENCOUNTER — Other Ambulatory Visit: Payer: Self-pay | Admitting: Physician Assistant

## 2024-06-19 MED ORDER — OXYCODONE-ACETAMINOPHEN 5-325 MG PO TABS: 1.0000 | ORAL_TABLET | Freq: Four times a day (QID) | ORAL | 0 refills | Status: DC | PRN

## 2024-06-23 ENCOUNTER — Encounter: Admitting: Physician Assistant

## 2024-06-23 DIAGNOSIS — Z96652 Presence of left artificial knee joint: Secondary | ICD-10-CM

## 2024-06-23 MED ORDER — OXYCODONE-ACETAMINOPHEN 5-325 MG PO TABS: 1.0000 | ORAL_TABLET | Freq: Three times a day (TID) | ORAL | 0 refills | Status: DC | PRN

## 2024-06-23 MED ORDER — CYCLOBENZAPRINE HCL 5 MG PO TABS: 5.0000 mg | ORAL_TABLET | Freq: Two times a day (BID) | ORAL | 0 refills | Status: DC | PRN

## 2024-06-23 NOTE — Progress Notes (Signed)
 "  Post-Op Visit Note   Patient: [name] [name]           Date of Birth: [birth year]           MRN: [id number] Visit Date: 06/23/2024 PCP: Loris Elsie PARAS, PA-C   Assessment & Plan:  Chief Complaint:  Chief Complaint  Patient presents with   Left Knee - Routine Post Op    06/08/2024-L TKA   Visit Diagnoses:  1. Status post total left knee replacement     Plan: Patient is a pleasant 44 year old female who comes in today 2 weeks status post left total knee replacement. She has been in a fair amount of pain which is somewhat relieved with oxycodone  and Robaxin .  She has been pliant taking baby aspirin  twice daily for DVT prophylaxis.  She has been getting home health PT.  Examination of her left knee reveals well-healed surgical incision with nylon sutures in place.  No evidence of infection or cellulitis.  Calves are soft nontender.  She is neurovascular intact distally.  Today, sutures were removed and Steri-Strips applied.  I refilled her Percocet and sent in Flexeril .  She will continue her baby aspirin  twice daily for another 4 weeks.  Referral for outpatient PT has been sent.  Follow-up in 4 weeks for repeat evaluation and 2 view x-rays of the left knee.  Call with concerns or questions.  Follow-Up Instructions: Return in about 4 weeks (around 07/21/2024).   Orders:  Orders Placed This Encounter  Procedures   Ambulatory referral to Physical Therapy   Meds ordered this encounter  Medications   oxyCODONE -acetaminophen  (PERCOCET/ROXICET) 5-325 MG tablet    Sig: Take 1-2 tablets by mouth every 8 (eight) hours as needed for severe pain (pain score 7-10).    Dispense:  30 tablet    Refill:  0    Ok to fill early as patient has needed to take 2 pills every 6 hours   cyclobenzaprine  (FLEXERIL ) 5 MG tablet    Sig: Take 1 tablet (5 mg total) by mouth 2 (two) times daily as needed for muscle spasms.    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) 08/05/2019   Elevated blood pressure reading in office without diagnosis of hypertension 08/05/2019   Attention deficit hyperactivity disorder (ADHD) 06/22/2015   Past Medical History:  Diagnosis Date   ADHD (attention deficit hyperactivity disorder)    Arthritis    Depression    Ectopic pregnancy    History of kidney stones    Hypertension     Family History  Problem Relation Age of Onset   Hypertension Father    Heart disease Father    Healthy Mother     Past Surgical History:  Procedure Laterality Date   TOTAL HIP ARTHROPLASTY Right 08/31/2019   Procedure: RIGHT TOTAL HIP ARTHROPLASTY ANTERIOR APPROACH;  Surgeon: Jerri Kay HERO, MD;  Location: [facility] OR;  Service: Orthopedics;  Laterality: Right;   TOTAL HIP ARTHROPLASTY Left 11/04/2023   Procedure: ARTHROPLASTY, HIP, TOTAL, ANTERIOR APPROACH;  Surgeon: Jerri Kay  M, MD;  Location: [facility] OR;  Service: Orthopedics;  Laterality: Left;   TOTAL KNEE ARTHROPLASTY Left 06/08/2024   Procedure: ARTHROPLASTY, KNEE, TOTAL;  Surgeon: Jerri Kay HERO, MD;  Location: [facility] OR;  Service: Orthopedics;  Laterality: Left;   WISDOM TOOTH EXTRACTION     Social History   Occupational History   Occupation: office manager    Comment: [employer]   Occupation: blogger    Comment: beer blog for [employer]  Tobacco Use   Smoking status: Former    Current packs/day: 0.00    Average packs/day: 1 pack/day for 23.0 years (23.0 ttl pk-yrs)    Types: Cigarettes    Start date: 05/28/1994    Quit date: 05/28/2017    Years since quitting: 7.0   Smokeless tobacco: Never  Vaping Use   Vaping  status: Never Used  Substance and Sexual Activity   Alcohol use: Never    Comment: 2 drinks per day   Drug use: No   Sexual activity: Yes    Partners: Male    Birth control/protection: None     "

## 2024-06-24 ENCOUNTER — Encounter: Payer: Self-pay | Admitting: Physical Therapy

## 2024-06-24 ENCOUNTER — Ambulatory Visit: Admitting: Physical Therapy

## 2024-06-24 DIAGNOSIS — M25662 Stiffness of left knee, not elsewhere classified: Secondary | ICD-10-CM

## 2024-06-24 DIAGNOSIS — R600 Localized edema: Secondary | ICD-10-CM

## 2024-06-24 DIAGNOSIS — M6281 Muscle weakness (generalized): Secondary | ICD-10-CM | POA: Diagnosis not present

## 2024-06-24 DIAGNOSIS — R2689 Other abnormalities of gait and mobility: Secondary | ICD-10-CM | POA: Diagnosis not present

## 2024-06-24 DIAGNOSIS — M25562 Pain in left knee: Secondary | ICD-10-CM

## 2024-06-24 NOTE — Therapy (Addendum)
 " OUTPATIENT PHYSICAL THERAPY LOWER EXTREMITY EVALUATION   Patient Name: [name] [name] MRN: [id number] DOB:05/25/1981, 44 y.o., female Today's Date: 06/24/2024  END OF SESSION:  PT End of Session - 06/24/24 1634     Visit Number 1    Number of Visits 20    Date for Recertification  09/04/24    Authorization Type Blue Cross Blue Shield COMM PPO    Authorization Time Period AUTH NEEDED $100 COPAY  BCBS DED MET, OOP Individual Remaining $3,734.06    PT Start Time 1440    PT Stop Time 1530    PT Time Calculation (min) 50 min    Activity Tolerance Patient tolerated treatment well    Behavior During Therapy WFL for tasks assessed/performed          Past Medical History:  Diagnosis Date   ADHD (attention deficit hyperactivity disorder)    Arthritis    Depression    Ectopic pregnancy    History of kidney stones    Hypertension    Past Surgical History:  Procedure Laterality Date   TOTAL HIP ARTHROPLASTY Right 08/31/2019   Procedure: RIGHT TOTAL HIP ARTHROPLASTY ANTERIOR APPROACH;  Surgeon: Jerri Kay HERO, MD;  Location: [facility];  Service: Orthopedics;  Laterality: Right;   TOTAL HIP ARTHROPLASTY Left 11/04/2023   Procedure: ARTHROPLASTY, HIP, TOTAL, ANTERIOR APPROACH;  Surgeon: Jerri Kay HERO, MD;  Location: [facility];  Service: Orthopedics;  Laterality: Left;   TOTAL KNEE ARTHROPLASTY Left 06/08/2024   Procedure: ARTHROPLASTY, KNEE, TOTAL;  Surgeon: Jerri Kay HERO, MD;  Location: [facility];  Service: Orthopedics;  Laterality: Left;   WISDOM TOOTH EXTRACTION     Patient Active Problem List   Diagnosis Date Noted   Status post total left knee replacement 06/08/2024   Primary osteoarthritis of left knee 04/21/2024   Chronic pain of both knees 01/07/2024   Status post total replacement of left hip 08/09/2020   Status post total replacement of right hip 08/31/2019   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) 08/05/2019   Elevated blood pressure reading in office without  diagnosis of hypertension 08/05/2019   Attention deficit hyperactivity disorder (ADHD) 06/22/2015    PCP: Loris Elsie PARAS, PA-C  REFERRING PROVIDER: Jule Ronal LITTIE DEVONNA  REFERRING DIAG: [phone] (ICD-10-CM) - Status post total left knee replacement   THERAPY DIAG:  Other abnormalities of gait and mobility  Stiffness of left knee, not elsewhere classified  Acute pain of left knee  Muscle weakness (generalized)  Localized edema  Rationale for Evaluation and Treatment: Rehabilitation  ONSET DATE: 06/08/2024 L TKA  SUBJECTIVE:   SUBJECTIVE STATEMENT: This 44 year old female underwent Lt TKA on 06/08/2024 due to OA impeding her mobility and function. Pt received HHPT and ended 06/23/2024. CPM machine was given for home and reached 90*.    PERTINENT HISTORY: 06/08/2024 Lt TKA, 08/31/2019 Rt THA, 11/04/2023 Lt THA, Hypertension, Arthritis, Depression, ADHD  DIAGNOSTIC FINDINGS: X-ray 06/08/2024 s/p TKA Left knee arthroplasty in expected alignment. No periprosthetic lucency or fracture. Recent postsurgical change includes air and edema in the soft tissues and joint space  PAIN:  NPRS scale: Lowest 5/10, Highest 9/10 with activity Pain location: Pain in patella and anterior knee Pain description: Sharp pain  Aggravating factors: Standing for long periods  Relieving factors: Sitting, OTC (Tylenol )   PRECAUTIONS: Knee  WEIGHT BEARING RESTRICTIONS: No  FALLS:  Has patient fallen in last 6 months? 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 10 represents able to perform at prior level. 0 1 2 3 4 5 6 7 8 9  10 (Date and Score)  Activity Eval 06/24/24    1. Ability to stand (ADL) 2     2. Walking 2     3. Stairs 1   4. Work 0   5.    Score 1.3    Total score = sum of the activity scores/number of activities Minimum detectable change (90%CI) for average score = 2 points Minimum detectable change (90%CI) for single activity score = 3 points  COGNITION: Overall cognitive status: WFL    SENSATION: WFL  EDEMA:  Circumferential:   RLE: above knee  46 cm  around knee 44.2 cm  below knee 39.9 cm LLE: above knee 51 cm  around knee 48.5 cm  below knee 40.7 cm  POSTURE:  weight shift right  PALPATION: Mild tenderness and swelling around Lt knee during palpation.   LOWER EXTREMITY ROM:   ROM Left eval  Hip flexion   Hip extension   Hip abduction   Hip adduction   Hip internal rotation   Hip external rotation   Knee flexion Seated: A: 85* P: 92*  Supine: A: 89* P: 98*   Knee extension Seated  LAQ A: -25*  Supine: Quad set: -12* P: -7*  Ankle dorsiflexion   Ankle plantarflexion   Ankle inversion   Ankle eversion    (Blank rows = not tested)  LOWER EXTREMITY MMT:  MMT Left eval  Hip flexion   Hip extension   Hip abduction   Hip adduction   Hip internal rotation   Hip external rotation   Knee flexion 3-/5  Knee extension 3-/5  Ankle dorsiflexion   Ankle plantarflexion   Ankle inversion   Ankle eversion    (Blank rows = not tested)  FUNCTIONAL TESTS:  18 inch chair transfer:  Requires use of arm rest with minimal engagement of LLE  GAIT: Distance walked: 20 ft; comfortable 2.23 ft/s; fast 2.61 ft/s Assistive device utilized: Single point cane with heavy UE WB support Level of assistance: SBA needs cues for deviations Comments: Antalgic gait pattern and  decreased stance duration LLE, left knee flexed in stance, with decreased flexion range in LLE during swing  TODAY'S TREATMENT                                                                          DATE: 06/24/2024 Therapeutic Exercise: HEP instruction/performance c cues for techniques, handout provided.  Trial set performed of each for comprehension and symptom assessment.  See below for exercise list  PATIENT EDUCATION:  Education details: HEP, POC Person educated: Patient Education method: Explanation, Demonstration, Verbal cues, and Handouts Education comprehension: verbalized understanding, returned demonstration, and verbal cues required  HOME EXERCISE PROGRAM: Access Code: [email or web address] URL: [email or web address] Date: 06/24/2024 Prepared by: Sherline Babe  Exercises - Ankle Alphabet in Elevation  - 2-4 x daily - 7 x weekly - 1 sets - 1 reps - supine quad set with towel roll under ankle  - 2-4 x daily - 7 x weekly - 2-3 sets - 10 reps - 5 seconds hold - Supine Heel Slide with Strap  - 2-3 x daily - 7 x weekly - 2-3 sets - 10 reps - 5 seconds hold - Supine Straight Leg Raises  - 2-3 x daily - 7 x weekly - 2-3 sets - 10 reps - 5 seconds hold - Seated Knee Flexion Extension AROM   - 2-4 x daily - 7 x weekly - 2-3 sets - 10 reps - 5 seconds hold - Seated Long Arc Quad  - 2-4 x daily - 7 x weekly - 2-3 sets - 10 reps - 5 seconds hold - Seated Active Straight-Leg Raise  - 2-4 x daily - 7 x weekly - 2-3 sets - 10 reps - 5 seconds hold  ASSESSMENT:  CLINICAL IMPRESSION: Patient is a 44 y.o. who comes to clinic with complaints of Lt knee pain s/p TKA with mobility, strength and movement coordination deficits that impair their ability to perform usual daily and recreational functional activities without increase difficulty/symptoms at this time.   Patient to benefit from skilled PT services to address impairments and limitations to improve to previous level of function without restriction secondary to condition.   OBJECTIVE IMPAIRMENTS: Abnormal gait, decreased activity tolerance, decreased balance, decreased knowledge of condition, decreased knowledge of use of DME, decreased mobility, difficulty walking, decreased ROM, decreased strength, increased edema, postural dysfunction, and pain.   ACTIVITY LIMITATIONS: carrying, lifting, bending, sitting, standing, squatting, sleeping, stairs, transfers, and locomotion level  PARTICIPATION LIMITATIONS: meal prep, cleaning, laundry, shopping, community activity, and occupation  PERSONAL FACTORS: Fitness, Past/current experiences, and 3+ comorbidities: see PMH are also affecting patient's functional outcome.   REHAB POTENTIAL: Good  CLINICAL DECISION MAKING: Stable/uncomplicated  EVALUATION COMPLEXITY: Moderate   GOALS: Goals reviewed with patient? Yes  SHORT TERM GOALS: (target date for Short term goals 07/17/2024)   1.  Patient will demonstrate independent use of home exercise program to maintain progress from in clinic treatments. Goal status: New  2. Left knee PROM extension -2* and flexion 105* Goal status: New  LONG TERM GOALS: (target dates for all long term goals  09/04/2024)   1. Patient will demonstrate/report pain at worst less than or equal to 2/10 to facilitate minimal limitation in daily activity secondary to pain symptoms. Goal status: New   2. Patient will demonstrate independent use of home exercise program to facilitate ability  to maintain/progress functional gains from skilled physical therapy services. Goal status: New   3. Patient will demonstrate Patient specific functional scale avg > or = 5 to indicate reduced disability due to condition.  Goal status: New   4.  Patient will demonstrate Lt knee LE MMT 5/5 throughout to faciltiate usual transfers, stairs,  squatting at [facility] for daily life.  Goal status: New   5.  Patient ambulates > 500 ft including negotiating ramps and curbs without an assistive device independently. Goal status: New   6.  Patient negotiates stairs alternating patterning single rail modified independent. Goal status: New   7.  Left knee AROM extension 0* and flexion 110* Goal Status: New   PLAN:  PT FREQUENCY: 2x/week  PT DURATION: 10 weeks  PLANNED INTERVENTIONS: 97164- PT Re-evaluation, 97750- Physical Performance Testing, 97110-Therapeutic exercises, 97530- Therapeutic activity, [id number]- Neuromuscular re-education, 97535- Self Care, [id number]- Manual therapy, [id number]- Gait training, [phone]- Electrical stimulation (unattended), [phone]- Electrical stimulation (manual), 97016- Vasopneumatic device, Patient/Family education, Balance training, Stair training, Taping, Joint mobilization, Scar mobilization, DME instructions, Cryotherapy, and Moist heat  PLAN FOR NEXT SESSION: Check and update HEP.  Manual therapy and therapeutic exercise with heavy focus on knee ROM. Vaso for edema management. Initiate gait training with cane.  Sherline Babe, Student-PT 06/24/2024, 17:18   This entire session of physical therapy was performed under the direct supervision of PT signing evaluation /treatment. PT reviewed note and agrees.   Grayce Spatz, PT, DPT 06/24/2024, 5:21 PM  "

## 2024-06-26 ENCOUNTER — Other Ambulatory Visit: Payer: Self-pay | Admitting: Physician Assistant

## 2024-06-26 MED ORDER — OXYCODONE-ACETAMINOPHEN 5-325 MG PO TABS: 1.0000 | ORAL_TABLET | Freq: Two times a day (BID) | ORAL | 0 refills | Status: AC | PRN

## 2024-06-29 ENCOUNTER — Other Ambulatory Visit (HOSPITAL_COMMUNITY)

## 2024-06-29 NOTE — Therapy (Signed)
 " OUTPATIENT PHYSICAL THERAPY LOWER EXTREMITY TREATMENT   Patient Name: [name] [name] MRN: [id number] DOB:January 10, 1981, 44 y.o., female Today's Date: 06/30/2024  END OF SESSION:  PT End of Session - 06/30/24 1203     Visit Number 2    Number of Visits 20    Date for Recertification  09/04/24    Authorization Type Blue Cross Blue Shield COMM PPO    Authorization Time Period AUTH NEEDED $100 COPAY  BCBS DED MET, OOP Individual Remaining $3,734.06    PT Start Time 1200    PT Stop Time 1239    PT Time Calculation (min) 39 min    Activity Tolerance Patient tolerated treatment well    Behavior During Therapy WFL for tasks assessed/performed           Past Medical History:  Diagnosis Date   ADHD (attention deficit hyperactivity disorder)    Arthritis    Depression    Ectopic pregnancy    History of kidney stones    Hypertension    Past Surgical History:  Procedure Laterality Date   TOTAL HIP ARTHROPLASTY Right 08/31/2019   Procedure: RIGHT TOTAL HIP ARTHROPLASTY ANTERIOR APPROACH;  Surgeon: Jerri Kay HERO, MD;  Location: [facility];  Service: Orthopedics;  Laterality: Right;   TOTAL HIP ARTHROPLASTY Left 11/04/2023   Procedure: ARTHROPLASTY, HIP, TOTAL, ANTERIOR APPROACH;  Surgeon: Jerri Kay HERO, MD;  Location: [facility];  Service: Orthopedics;  Laterality: Left;   TOTAL KNEE ARTHROPLASTY Left 06/08/2024   Procedure: ARTHROPLASTY, KNEE, TOTAL;  Surgeon: Jerri Kay HERO, MD;  Location: [facility];  Service: Orthopedics;  Laterality: Left;   WISDOM TOOTH EXTRACTION     Patient Active Problem List   Diagnosis Date Noted   Status post total left knee replacement 06/08/2024   Primary osteoarthritis of left knee 04/21/2024   Chronic pain of both knees 01/07/2024   Status post total replacement of left hip 08/09/2020   Status post total replacement of right hip 08/31/2019   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) 08/05/2019   Elevated blood pressure reading in office without  diagnosis of hypertension 08/05/2019   Attention deficit hyperactivity disorder (ADHD) 06/22/2015    PCP: Loris Elsie PARAS, PA-C  REFERRING PROVIDER: Jule Ronal LITTIE DEVONNA  REFERRING DIAG: [id number] (ICD-10-CM) - Status post total left knee replacement   THERAPY DIAG:  Other abnormalities of gait and mobility  Stiffness of left knee, not elsewhere classified  Acute pain of left knee  Muscle weakness (generalized)  Localized edema  Rationale for Evaluation and Treatment: Rehabilitation  ONSET DATE: 06/08/2024 L TKA  SUBJECTIVE:   SUBJECTIVE STATEMENT: Has gotten up to 100* on CPM machine. Went shopping one day since last visit for about an hour and felt fatigued and noticed some swelling. Pt. has not been out into the community since then and has been elevating and icing. Some heat for mild muscle spasms.    PERTINENT HISTORY: 06/08/2024 Lt TKA, 08/31/2019 Rt THA, 11/04/2023 Lt THA, Hypertension, Arthritis, Depression, ADHD  DIAGNOSTIC FINDINGS: X-ray 06/08/2024 s/p TKA Left knee arthroplasty in expected alignment. No periprosthetic lucency or fracture. Recent postsurgical change includes air and edema in the soft tissues and joint space  PAIN:  NPRS scale: since last PT session Lowest 5/10, Highest 7/10 with activity Pain location: Pain in patella and anterior knee Pain description: Sharp pain  Aggravating factors: Standing for long periods  Relieving factors: Sitting, OTC (Tylenol )   PRECAUTIONS: Knee  WEIGHT BEARING RESTRICTIONS: No  FALLS:  Has patient fallen in last 6 months? 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 10 represents able to perform at prior level. 0 1 2 3 4 5 6 7 8 9  10 (Date and Score)  Activity Eval  06/24/24    1. Ability to stand (ADL) 2     2. Walking 2     3. Stairs 1   4. Work 0   5.    Score 1.3    Total score = sum of the activity scores/number of activities Minimum detectable change (90%CI) for average score = 2 points Minimum detectable change (90%CI) for single activity score = 3 points  COGNITION: Overall cognitive status: WFL    SENSATION: WFL  EDEMA:  Circumferential:   RLE: above knee  46 cm  around knee 44.2 cm  below knee 39.9 cm LLE: above knee 51 cm  around knee 48.5 cm  below knee 40.7 cm  POSTURE:  weight shift right  PALPATION: Mild tenderness and swelling around Lt knee during palpation.   LOWER EXTREMITY ROM:   ROM Left eval Left 06/30/24  Hip flexion    Hip extension    Hip abduction    Hip adduction    Hip internal rotation    Hip external rotation    Knee flexion Seated: A: 85* P: 92*  Supine: A: 89* P: 98*  Supine: A: 95* P: 101*   Knee extension Seated  LAQ A: -25*  Supine: Quad set: -12* P: -7* Supine: A: -5* P: -2*  Ankle dorsiflexion    Ankle plantarflexion    Ankle inversion    Ankle eversion     (Blank rows = not tested)  LOWER EXTREMITY MMT:  MMT Left eval  Hip flexion   Hip extension   Hip abduction   Hip adduction   Hip internal rotation   Hip external rotation   Knee flexion 3-/5  Knee extension 3-/5  Ankle dorsiflexion   Ankle plantarflexion   Ankle inversion   Ankle eversion    (Blank rows = not tested)  FUNCTIONAL TESTS:  18 inch chair transfer:  Requires use of arm rest with minimal engagement of LLE  GAIT: Distance walked: 20 ft; comfortable 2.23 ft/s; fast 2.61  ft/s Assistive device utilized: Single point cane with heavy UE WB support Level of assistance: SBA needs cues for deviations Comments: Antalgic gait pattern and decreased stance duration LLE, left knee flexed in stance, with decreased flexion range in LLE during swing  TODAY'S TREATMENT   06/30/2024  TherEx SciFit bike, 4 mins with BUE asssist and 4 mins BLE only; seat 8  Alternating LAQ and active knee flexion with contralateral LE opposing motion to facilitate better muscle contraction; 2 x 10 ; 1 set with 3 lb weight and 1 set without weight ROM measurements (see in objective section)  Therapeutic Activities: Patient education on loading and weight shift on bilateral knees after bike exercise to help with joint unloading and compression and reduce a stiff gait pattern. PT & pt noted pt had less antalgic gait pattern with initiating after weight shift.  Leg press machine; BLE 100 lbs; 1 x 10  Leg press machine; LLE 37 lbs; 1 x 10   Manual  Seated Lt. knee flexion and tibial IR. Thigh and other arm providing overpressure into flexion and IR to improve knee flexion ROM Long sitting Lt. Knee extension; towel underneath pt. Lt. Ankle; PT add overpressure at knee with combined IR of the tibia and ER of the femur  Modalities Vaso Lt knee elevation; 10 mins med compression 34*   TREATMENT                                                                          DATE: 06/24/2024 Therapeutic Exercise: HEP instruction/performance c cues for techniques, handout provided.  Trial set performed of each for comprehension and symptom assessment.  See below for exercise list  PATIENT EDUCATION:  Education details: HEP, POC Person educated: Patient Education method: Explanation, Demonstration, Verbal cues, and Handouts Education comprehension: verbalized understanding,  returned demonstration, and verbal cues required  HOME EXERCISE PROGRAM: Access Code: [email or web address] URL: [email or web address] Date: 06/24/2024 Prepared by: Sherline Babe  Exercises - Ankle Alphabet in Elevation  - 2-4 x daily - 7 x weekly - 1 sets - 1 reps - supine quad set with towel roll under ankle  - 2-4 x daily - 7 x weekly - 2-3 sets - 10 reps - 5 seconds hold - Supine Heel Slide with Strap  - 2-3 x daily - 7 x weekly - 2-3 sets - 10 reps - 5 seconds hold - Supine Straight Leg Raises  - 2-3 x daily - 7 x weekly - 2-3 sets - 10 reps - 5 seconds hold - Seated Knee Flexion Extension AROM   - 2-4 x daily - 7 x weekly - 2-3 sets - 10 reps - 5 seconds hold - Seated Long Arc Quad  - 2-4 x daily - 7 x weekly - 2-3 sets - 10 reps - 5 seconds hold - Seated Active Straight-Leg Raise  - 2-4 x daily - 7 x weekly - 2-3 sets - 10 reps - 5 seconds hold  ASSESSMENT:  CLINICAL IMPRESSION: Patient improved ROM measurement since initial visit and no complaints of pain unless with prolonged activity. Pt. Is using a cane for community and household ambulation. Patient obtained full revolutions on the SciFit bike demonstrating progress in flexion, but is still challenged with full Lt. Knee extension as seen in measurement and need for manual therapy from PT. PT noted increased ROM with therapist overpressure and stretching. Patient will benefit from skilled PT services to improve AROM/PROM, strength, and activity endurance for return to work and  community dwelling.   OBJECTIVE IMPAIRMENTS: Abnormal gait, decreased activity tolerance, decreased balance, decreased knowledge of condition, decreased knowledge of use of DME, decreased mobility, difficulty walking, decreased ROM, decreased strength, increased edema, postural dysfunction, and pain.   ACTIVITY LIMITATIONS: carrying, lifting, bending, sitting, standing, squatting, sleeping, stairs, transfers, and locomotion level  PARTICIPATION LIMITATIONS:  meal prep, cleaning, laundry, shopping, community activity, and occupation  PERSONAL FACTORS: Fitness, Past/current experiences, and 3+ comorbidities: see PMH are also affecting patient's functional outcome.   REHAB POTENTIAL: Good  CLINICAL DECISION MAKING: Stable/uncomplicated  EVALUATION COMPLEXITY: Moderate   GOALS: Goals reviewed with patient? Yes  SHORT TERM GOALS: (target date for Short term goals 07/17/2024)    1.  Patient will demonstrate independent use of home exercise program to maintain progress from in clinic treatments. Goal status: Ongoing 06/30/2024  2. Left knee PROM extension -2* and flexion 105* Goal status: Ongoing (progressing) 06/30/2024  LONG TERM GOALS: (target dates for all long term goals  09/04/2024)   1. Patient will demonstrate/report pain at worst less than or equal to 2/10 to facilitate minimal limitation in daily activity secondary to pain symptoms. Goal status: Ongoing 06/30/2024   2. Patient will demonstrate independent use of home exercise program to facilitate ability to maintain/progress functional gains from skilled physical therapy services. Goal status: Ongoing 06/30/2024   3. Patient will demonstrate Patient specific functional scale avg > or = 5 to indicate reduced disability due to condition.  Goal status: Ongoing 06/30/2024   4.  Patient will demonstrate Lt knee LE MMT 5/5 throughout to faciltiate usual transfers, stairs, squatting at [facility] for daily life.  Goal status: Ongoing 06/30/2024   5.  Patient ambulates > 500 ft including negotiating ramps and curbs without an assistive device independently. Goal status: Ongoing 06/30/2024   6.  Patient negotiates stairs alternating patterning single rail modified independent. Goal status: Ongoing 06/30/2024   7.  Left knee AROM extension 0* and flexion 110* Goal Status: Ongoing 06/30/2024   PLAN:  PT FREQUENCY: 2x/week  PT DURATION: 10 weeks  PLANNED INTERVENTIONS: 97164- PT Re-evaluation,  97750- Physical Performance Testing, 97110-Therapeutic exercises, 97530- Therapeutic activity, [id number]- Neuromuscular re-education, 97535- Self Care, 02859- Manual therapy, [phone]- Gait training, [phone]- Electrical stimulation (unattended), [phone]- Electrical stimulation (manual), 97016- Vasopneumatic device, Patient/Family education, Balance training, Stair training, Taping, Joint mobilization, Scar mobilization, DME instructions, Cryotherapy, and Moist heat  PLAN FOR NEXT SESSION:  gait training with cane for technique. Continue therapeutic exercise & activities for knee ROM. Check and update HEP.      Sherline Babe, Student-PT 06/30/2024, 1:28 PM   This entire session of physical therapy was performed under the direct supervision of PT signing evaluation /treatment. PT reviewed note and agrees.   Grayce Spatz, PT, DPT 06/30/2024, 3:34 PM  "

## 2024-06-30 ENCOUNTER — Encounter: Payer: Self-pay | Admitting: Physical Therapy

## 2024-06-30 ENCOUNTER — Ambulatory Visit: Admitting: Physical Therapy

## 2024-06-30 DIAGNOSIS — M6281 Muscle weakness (generalized): Secondary | ICD-10-CM

## 2024-06-30 DIAGNOSIS — R600 Localized edema: Secondary | ICD-10-CM

## 2024-06-30 DIAGNOSIS — M25562 Pain in left knee: Secondary | ICD-10-CM

## 2024-06-30 DIAGNOSIS — R2689 Other abnormalities of gait and mobility: Secondary | ICD-10-CM

## 2024-06-30 DIAGNOSIS — M25662 Stiffness of left knee, not elsewhere classified: Secondary | ICD-10-CM | POA: Diagnosis not present

## 2024-07-01 NOTE — Therapy (Incomplete)
 " OUTPATIENT PHYSICAL THERAPY LOWER EXTREMITY TREATMENT   Patient Name: [name] [name] MRN: [id number] DOB:Jan 27, 1981, 44 y.o., female Today's Date: 07/01/2024  END OF SESSION:     Past Medical History:  Diagnosis Date   ADHD (attention deficit hyperactivity disorder)    Arthritis    Depression    Ectopic pregnancy    History of kidney stones    Hypertension    Past Surgical History:  Procedure Laterality Date   TOTAL HIP ARTHROPLASTY Right 08/31/2019   Procedure: RIGHT TOTAL HIP ARTHROPLASTY ANTERIOR APPROACH;  Surgeon: Jerri Kay HERO, MD;  Location: [facility] OR;  Service: Orthopedics;  Laterality: Right;   TOTAL HIP ARTHROPLASTY Left 11/04/2023   Procedure: ARTHROPLASTY, HIP, TOTAL, ANTERIOR APPROACH;  Surgeon: Jerri Kay HERO, MD;  Location: [facility] OR;  Service: Orthopedics;  Laterality: Left;   TOTAL KNEE ARTHROPLASTY Left 06/08/2024   Procedure: ARTHROPLASTY, KNEE, TOTAL;  Surgeon: Jerri Kay HERO, MD;  Location: [facility] OR;  Service: Orthopedics;  Laterality: Left;   WISDOM TOOTH EXTRACTION     Patient Active Problem List   Diagnosis Date Noted   Status post total left knee replacement 06/08/2024   Primary osteoarthritis of left knee 04/21/2024   Chronic pain of both knees 01/07/2024   Status post total replacement of left hip 08/09/2020   Status post total replacement of right hip 08/31/2019   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  Rationale for Evaluation and Treatment: Rehabilitation  ONSET DATE: 06/08/2024 L TKA  SUBJECTIVE:   SUBJECTIVE STATEMENT: ***  Has gotten up to 100* on CPM machine. Went  shopping one day since last visit for about an hour and felt fatigued and noticed some swelling. Pt. has not been out into the community since then and has been elevating and icing. Some heat for mild muscle spasms.    PERTINENT HISTORY: 06/08/2024 Lt TKA, 08/31/2019 Rt THA, 11/04/2023 Lt THA, Hypertension, Arthritis, Depression, ADHD  DIAGNOSTIC FINDINGS: X-ray 06/08/2024 s/p TKA Left knee arthroplasty in expected alignment. No periprosthetic lucency or fracture. Recent postsurgical change includes air and edema in the soft tissues and joint space  PAIN:  *** NPRS scale: since last PT session Lowest 5/10, Highest 7/10 with activity Pain location: Pain in patella and anterior knee Pain description: Sharp pain  Aggravating factors: Standing for long periods  Relieving factors: Sitting, OTC (Tylenol )   PRECAUTIONS: Knee  WEIGHT BEARING RESTRICTIONS: No  FALLS:  Has patient fallen in last 6 months? 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 10 represents able to perform at prior level. 0 1 2 3 4 5 6 7 8 9  10 (Date and  Score)  Activity Eval  06/24/24    1. Ability to stand (ADL) 2     2. Walking 2     3. Stairs 1   4. Work 0   5.    Score 1.3    Total score = sum of the activity scores/number of activities Minimum detectable change (90%CI) for average score = 2 points Minimum detectable change (90%CI) for single  activity score = 3 points  COGNITION: Overall cognitive status: WFL    SENSATION: WFL  EDEMA:  Circumferential:   RLE: above knee  46 cm  around knee 44.2 cm  below knee 39.9 cm LLE: above knee 51 cm  around knee 48.5 cm  below knee 40.7 cm  POSTURE:  weight shift right  PALPATION: Mild tenderness and swelling around Lt knee during palpation.   LOWER EXTREMITY ROM:   ROM Left eval Left 06/30/24  Hip flexion    Hip extension    Hip abduction    Hip adduction    Hip internal rotation    Hip external rotation    Knee flexion Seated: A: 85* P: 92*  Supine: A: 89* P: 98*  Supine: A: 95* P: 101*   Knee extension Seated  LAQ A: -25*  Supine: Quad set: -12* P: -7* Supine: A: -5* P: -2*  Ankle dorsiflexion    Ankle plantarflexion    Ankle inversion    Ankle eversion     (Blank rows = not tested)  LOWER EXTREMITY MMT:  MMT Left eval  Hip flexion   Hip extension   Hip abduction   Hip adduction   Hip internal rotation   Hip external rotation   Knee flexion 3-/5  Knee extension 3-/5  Ankle dorsiflexion   Ankle plantarflexion   Ankle inversion   Ankle eversion    (Blank rows = not tested)  FUNCTIONAL TESTS:  18 inch chair transfer:  Requires use of arm rest with minimal engagement of LLE  GAIT: Distance walked: 20 ft; comfortable 2.23 ft/s; fast 2.61 ft/s Assistive device utilized: Single point cane with heavy UE WB support Level of assistance: SBA needs cues for deviations Comments: Antalgic gait pattern and decreased stance duration LLE, left knee flexed in stance, with decreased flexion range in LLE during swing                                                                                                                                     TODAY'S TREATMENT  *** 07/02/2024 TherEx TherAct Manual  Modalities  TREATMENT   06/30/2024  TherEx SciFit bike, 4 mins with BUE asssist and 4 mins BLE only; seat 8  Alternating LAQ and active knee  flexion with contralateral LE opposing motion to facilitate better muscle contraction; 2 x 10 ; 1 set with 3 lb weight and 1 set without weight ROM measurements (see in objective section)  Therapeutic Activities: Patient education on loading  and weight shift on bilateral knees after bike exercise to help with joint unloading and compression and reduce a stiff gait pattern. PT & pt noted pt had less antalgic gait pattern with initiating after weight shift.  Leg press machine; BLE 100 lbs; 1 x 10  Leg press machine; LLE 37 lbs; 1 x 10   Manual  Seated Lt. knee flexion and tibial IR. Thigh and other arm providing overpressure into flexion and IR to improve knee flexion ROM Long sitting Lt. Knee extension; towel underneath pt. Lt. Ankle; PT add overpressure at knee with combined IR of the tibia and ER of the femur  Modalities Vaso Lt knee elevation; 10 mins med compression 34*   TREATMENT                                                                          DATE: 06/24/2024 Therapeutic Exercise: HEP instruction/performance c cues for techniques, handout provided.  Trial set performed of each for comprehension and symptom assessment.  See below for exercise list  PATIENT EDUCATION:  Education details: HEP, POC Person educated: Patient Education method: Explanation, Demonstration, Verbal cues, and Handouts Education comprehension: verbalized understanding, returned demonstration, and verbal cues required  HOME EXERCISE PROGRAM: Access Code: [email or web address] URL: [email or web address] Date: 06/24/2024 Prepared by: Sherline Babe  Exercises - Ankle Alphabet in Elevation  - 2-4 x daily - 7 x weekly - 1 sets - 1 reps - supine quad set with towel roll under ankle  - 2-4 x daily - 7 x weekly - 2-3 sets - 10 reps - 5 seconds hold - Supine Heel Slide with Strap  - 2-3 x daily - 7 x weekly - 2-3 sets - 10 reps - 5 seconds hold - Supine Straight Leg Raises  - 2-3 x daily - 7 x weekly - 2-3  sets - 10 reps - 5 seconds hold - Seated Knee Flexion Extension AROM   - 2-4 x daily - 7 x weekly - 2-3 sets - 10 reps - 5 seconds hold - Seated Long Arc Quad  - 2-4 x daily - 7 x weekly - 2-3 sets - 10 reps - 5 seconds hold - Seated Active Straight-Leg Raise  - 2-4 x daily - 7 x weekly - 2-3 sets - 10 reps - 5 seconds hold  ASSESSMENT:  CLINICAL IMPRESSION: *** Patient improved ROM measurement since initial visit and no complaints of pain unless with prolonged activity. Pt. Is using a cane for community and household ambulation. Patient obtained full revolutions on the SciFit bike demonstrating progress in flexion, but is still challenged with full Lt. Knee extension as seen in measurement and need for manual therapy from PT. PT noted increased ROM with therapist overpressure and stretching. Patient will benefit from skilled PT services to improve AROM/PROM, strength, and activity endurance for return to work and community dwelling.   OBJECTIVE IMPAIRMENTS: Abnormal gait, decreased activity tolerance, decreased balance, decreased knowledge of condition, decreased knowledge of use of DME, decreased mobility, difficulty walking, decreased ROM, decreased strength, increased edema, postural dysfunction, and pain.   ACTIVITY LIMITATIONS: carrying, lifting, bending, sitting, standing, squatting, sleeping, stairs, transfers, and locomotion level  PARTICIPATION LIMITATIONS: meal prep, cleaning, laundry, shopping, community activity, and occupation  PERSONAL FACTORS: Fitness, Past/current experiences, and 3+ comorbidities: see PMH are also affecting patient's functional outcome.   REHAB POTENTIAL: Good  CLINICAL DECISION MAKING: Stable/uncomplicated  EVALUATION COMPLEXITY: Moderate   GOALS: Goals reviewed with patient? Yes ***  SHORT TERM GOALS: (target date for Short term goals 07/17/2024)    1.  Patient will demonstrate independent use of home exercise program to maintain progress from in  clinic treatments. Goal status: Ongoing 06/30/2024  2. Left knee PROM extension -2* and flexion 105* Goal status: Ongoing (progressing) 06/30/2024  LONG TERM GOALS: (target dates for all long term goals  09/04/2024)   1. Patient will demonstrate/report pain at worst less than or equal to 2/10 to facilitate minimal limitation in daily activity secondary to pain symptoms. Goal status: Ongoing 06/30/2024   2. Patient will demonstrate independent use of home exercise program to facilitate ability to maintain/progress functional gains from skilled physical therapy services. Goal status: Ongoing 06/30/2024   3. Patient will demonstrate Patient specific functional scale avg > or = 5 to indicate reduced disability due to condition.  Goal status: Ongoing 06/30/2024   4.  Patient will demonstrate Lt knee LE MMT 5/5 throughout to faciltiate usual transfers, stairs, squatting at [facility] for daily life.  Goal status: Ongoing 06/30/2024   5.  Patient ambulates > 500 ft including negotiating ramps and curbs without an assistive device independently. Goal status: Ongoing 06/30/2024   6.  Patient negotiates stairs alternating patterning single rail modified independent. Goal status: Ongoing 06/30/2024   7.  Left knee AROM extension 0* and flexion 110* Goal Status: Ongoing 06/30/2024   PLAN:  PT FREQUENCY: 2x/week  PT DURATION: 10 weeks  PLANNED INTERVENTIONS: 97164- PT Re-evaluation, 97750- Physical Performance Testing, 97110-Therapeutic exercises, 97530- Therapeutic activity, [id number]- Neuromuscular re-education, 97535- Self Care, [id number]- Manual therapy, [id number]- Gait training, [phone]- Electrical stimulation (unattended), [phone]- Electrical stimulation (manual), 97016- Vasopneumatic device, Patient/Family education, Balance training, Stair training, Taping, Joint mobilization, Scar mobilization, DME instructions, Cryotherapy, and Moist heat  PLAN FOR NEXT SESSION: *** gait training with cane for technique. Continue  therapeutic exercise & activities for knee ROM. Check and update HEP.     ***SIGN "

## 2024-07-02 ENCOUNTER — Encounter: Admitting: Physical Therapy

## 2024-07-03 ENCOUNTER — Other Ambulatory Visit: Payer: Self-pay | Admitting: Physician Assistant

## 2024-07-03 MED ORDER — HYDROCODONE-ACETAMINOPHEN 5-325 MG PO TABS: 1.0000 | ORAL_TABLET | Freq: Two times a day (BID) | ORAL | 0 refills | Status: DC | PRN

## 2024-07-07 ENCOUNTER — Other Ambulatory Visit: Payer: Self-pay | Admitting: Medical Genetics

## 2024-07-07 DIAGNOSIS — Z006 Encounter for examination for normal comparison and control in clinical research program: Secondary | ICD-10-CM

## 2024-07-07 NOTE — Therapy (Incomplete)
 " OUTPATIENT PHYSICAL THERAPY LOWER EXTREMITY TREATMENT   Patient Name: [name] [name] MRN: [id number] DOB:1980-12-29, 44 y.o., female Today's Date: 07/08/2024  END OF SESSION:  PT End of Session - 07/08/24 1446     Visit Number 3    Number of Visits 20    Date for Recertification  09/04/24    Authorization Type [employer] COMM PPO    Authorization Time Period AUTH NEEDED $100 COPAY  BCBS DED MET, OOP Individual Remaining $3,734.06    PT Start Time 1448    PT Stop Time 1544    PT Time Calculation (min) 56 min    Activity Tolerance Patient tolerated treatment well    Behavior During Therapy WFL for tasks assessed/performed            Past Medical History:  Diagnosis Date   ADHD (attention deficit hyperactivity disorder)    Arthritis    Depression    Ectopic pregnancy    History of kidney stones    Hypertension    Past Surgical History:  Procedure Laterality Date   TOTAL HIP ARTHROPLASTY Right 08/31/2019   Procedure: RIGHT TOTAL HIP ARTHROPLASTY ANTERIOR APPROACH;  Surgeon: Jerri Kay HERO, MD;  Location: [facility] OR;  Service: Orthopedics;  Laterality: Right;   TOTAL HIP ARTHROPLASTY Left 11/04/2023   Procedure: ARTHROPLASTY, HIP, TOTAL, ANTERIOR APPROACH;  Surgeon: Jerri Kay HERO, MD;  Location: [facility] OR;  Service: Orthopedics;  Laterality: Left;   TOTAL KNEE ARTHROPLASTY Left 06/08/2024   Procedure: ARTHROPLASTY, KNEE, TOTAL;  Surgeon: Jerri Kay HERO, MD;  Location: [facility] OR;  Service: Orthopedics;  Laterality: Left;   WISDOM TOOTH EXTRACTION     Patient Active Problem List   Diagnosis Date Noted   Status post total left knee replacement 06/08/2024   Primary osteoarthritis of left knee 04/21/2024   Chronic pain of both knees 01/07/2024   Status post total replacement of left hip 08/09/2020   Status post total replacement of right hip 08/31/2019   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) 08/05/2019   Elevated blood pressure reading in office without  diagnosis of hypertension 08/05/2019   Attention deficit hyperactivity disorder (ADHD) 06/22/2015    PCP: Loris Elsie PARAS, PA-C  REFERRING PROVIDER: Jule Ronal LITTIE DEVONNA  REFERRING DIAG: [phone] (ICD-10-CM) - Status post total left knee replacement   THERAPY DIAG:  Other abnormalities of gait and mobility  Stiffness of left knee, not elsewhere classified  Acute pain of left knee  Muscle weakness (generalized)  Localized edema  Rationale for Evaluation and Treatment: Rehabilitation  ONSET DATE: 06/08/2024 L TKA  SUBJECTIVE:   SUBJECTIVE STATEMENT: Patient states that she had to return CPM in on Monday (1/19 / 26). She has been doing HEP, functional squatting, and marches at home.     PERTINENT HISTORY: 06/08/2024 Lt TKA, 08/31/2019 Rt THA, 11/04/2023 Lt THA, Hypertension, Arthritis, Depression, ADHD  DIAGNOSTIC FINDINGS: X-ray 06/08/2024 s/p TKA Left knee arthroplasty in expected alignment. No periprosthetic lucency or fracture. Recent postsurgical change includes air and edema in the soft tissues and joint space  PAIN:   NPRS scale: since last PT session patient is having stiffness in surgical leg (Lt knee) rest 4/10 with activity 7/10; but pain in Rt knee that is keeping her up from possible compensation  Pain location: Pain in patella and anterior knee Pain description: Sharp pain  Aggravating factors: Standing for long periods  Relieving factors: Sitting, OTC (Tylenol )   PRECAUTIONS: Knee  WEIGHT BEARING RESTRICTIONS:  No  FALLS:  Has patient fallen in last 6 months? 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 10 represents able to perform at prior level. 0 1 2 3 4 5 6 7 8 9  10 (Date and Score)  Activity Eval  06/24/24    1. Ability to stand (ADL) 2     2. Walking 2     3. Stairs 1   4. Work 0   5.    Score 1.3    Total score = sum of the activity scores/number of activities Minimum detectable change (90%CI) for average score = 2 points Minimum detectable change (90%CI) for single activity score = 3 points  COGNITION: Overall cognitive status: WFL    SENSATION: WFL  EDEMA:  Circumferential:   RLE: above knee  46 cm  around knee 44.2 cm  below knee 39.9 cm LLE: above knee 51 cm  around knee 48.5 cm  below knee 40.7 cm  POSTURE:  weight shift right  PALPATION: Mild tenderness and swelling around Lt knee during palpation.   LOWER EXTREMITY ROM:   ROM Left eval Left 06/30/24  Hip flexion    Hip extension    Hip abduction    Hip adduction    Hip internal rotation    Hip external rotation    Knee flexion Seated: A: 85* P: 92*  Supine: A: 89* P: 98*  Supine: A: 95* P: 101*   Knee extension Seated  LAQ A: -25*  Supine: Quad set: -12* P: -7* Supine: A: -5* P: -2*  Ankle dorsiflexion    Ankle plantarflexion    Ankle inversion    Ankle eversion     (Blank rows = not tested)  LOWER EXTREMITY MMT:  MMT Left eval  Hip flexion   Hip extension   Hip abduction   Hip adduction   Hip internal rotation   Hip external rotation   Knee flexion 3-/5  Knee extension 3-/5  Ankle dorsiflexion   Ankle plantarflexion   Ankle inversion   Ankle eversion    (Blank rows = not tested)  FUNCTIONAL TESTS:  18 inch chair transfer:  Requires use of arm rest with minimal engagement of LLE  GAIT: Distance walked: 20 ft; comfortable 2.23 ft/s; fast 2.61  ft/s Assistive device utilized: Single point cane with heavy UE WB support Level of assistance: SBA needs cues for deviations Comments: Antalgic gait pattern and decreased stance duration LLE, left knee flexed in stance, with decreased flexion range in LLE during swing  TODAY'S TREATMENT  07/08/2024 TherEx Recumbent bike; seat 6 level 3 8 min  Seated Hamstring Curl with Anchored Resistance; green 2 sets 10 reps.  Standing Terminal Knee Extension with Resistance; green 2 sets 10 reps  Progression: Standing TKE with resistance > single leg stance on Lt LE; green 2 sets 10 reps  Alternating LAQ and active knee flexion with contralateral LE opposing motion to facilitate better muscle contraction; 3lb ankle weight 2 sets 10 reps  Updated HEP and patient received HO and green theraband for home. Patient demonstrated and verbalized understanding.  TherAct Leg press machine; BLE 106 lbs; 1 set 10 reps Leg press machine; LLE 37 lbs; 2 set 10 reps Sit <> Stand from standard chair with arms crossed; 1 set 10 reps. PT verbal cue and demonstration to unlock knees during sitting and reach trunk forward over toes for improved transition.   Manual  Seated knee flexion and tibial IR with massage gun facilitation in quadriceps to decrease muscle guarding and increase range  Long sitting Lt. Knee extension; towel underneath pt. Lt. Ankle; PT add overpressure at knee with combined IR of the tibia and ER of the femur  Modalities Vaso Lt knee elevation; 10 mins med compression 34*   TREATMENT   06/30/2024  TherEx SciFit bike, 4 mins with BUE asssist and 4 mins BLE only; seat 8  Alternating LAQ and active knee flexion with contralateral LE opposing motion to facilitate better muscle contraction; 2 x 10 ; 1 set with 3 lb weight and 1 set without weight ROM measurements (see in  objective section)  Therapeutic Activities: Patient education on loading and weight shift on bilateral knees after bike exercise to help with joint unloading and compression and reduce a stiff gait pattern. PT & pt noted pt had less antalgic gait pattern with initiating after weight shift.  Leg press machine; BLE 100 lbs; 1 x 10  Leg press machine; LLE 37 lbs; 1 x 10   Manual  Seated Lt. knee flexion and tibial IR. Thigh and other arm providing overpressure into flexion and IR to improve knee flexion ROM Long sitting Lt. Knee extension; towel underneath pt. Lt. Ankle; PT add overpressure at knee with combined IR of the tibia and ER of the femur  Modalities Vaso Lt knee elevation; 10 mins med compression 34*   TREATMENT                                                                          DATE: 06/24/2024 Therapeutic Exercise: HEP instruction/performance c cues for techniques, handout provided.  Trial set performed of each for comprehension and symptom assessment.  See below for exercise list  PATIENT EDUCATION:  Education details: HEP, POC Person educated: Patient Education method: Explanation, Demonstration, Verbal cues, and Handouts Education comprehension: verbalized understanding, returned demonstration, and verbal cues required  HOME EXERCISE PROGRAM: Access Code: [email or web address] URL: [email or web address] Date: 07/08/2024 Prepared by: Sherline Babe  Exercises - Ankle Alphabet in Elevation  - 2-4 x daily - 7 x weekly - 1 sets - 1 reps - supine quad set with towel roll under ankle  - 2-4 x daily - 7 x weekly - 2-3 sets - 10 reps - 5 seconds hold - Supine  Heel Slide with Strap  - 2-3 x daily - 7 x weekly - 2-3 sets - 10 reps - 5 seconds hold - Supine Straight Leg Raises  - 2-3 x daily - 7 x weekly - 2-3 sets - 10 reps - 5 seconds hold - Seated Knee Flexion Extension AROM   - 2-4 x daily - 7 x weekly - 2-3 sets - 10 reps - 5 seconds hold - Seated Long Arc Quad  - 2-4 x  daily - 7 x weekly - 2-3 sets - 10 reps - 5 seconds hold - Seated Active Straight-Leg Raise  - 2-4 x daily - 7 x weekly - 2-3 sets - 10 reps - 5 seconds hold - Standing Terminal Knee Extension with Resistance  - 1-2 x daily - 7 x weekly - 2-3 sets - 10 reps - 3-5 hold - Seated Hamstring Curl with Anchored Resistance  - 1-2 x daily - 7 x weekly - 2-3 sets - 10 reps - 3-5 hold - Sit to Stand with Arms Crossed  - 1-2 x daily - 7 x weekly - 2-3 sets - 10 reps  ASSESSMENT:  CLINICAL IMPRESSION: Patient arrived to session today without the use of a cane. While her gait is antalgic with a slight limp, she is improving without the need of a device. AROM has improved notable during manual therapy and her progression to the recumbent bike with full revolutions. She continues to have difficulty with fatigue, muscle endurance, gait, and strength in the Lt knee. Patient will continue to benefit from skilled PT services to improve AROM/PROM, strength, and activity endurance for return to work and community dwelling.   OBJECTIVE IMPAIRMENTS: Abnormal gait, decreased activity tolerance, decreased balance, decreased knowledge of condition, decreased knowledge of use of DME, decreased mobility, difficulty walking, decreased ROM, decreased strength, increased edema, postural dysfunction, and pain.   ACTIVITY LIMITATIONS: carrying, lifting, bending, sitting, standing, squatting, sleeping, stairs, transfers, and locomotion level  PARTICIPATION LIMITATIONS: meal prep, cleaning, laundry, shopping, community activity, and occupation  PERSONAL FACTORS: Fitness, Past/current experiences, and 3+ comorbidities: see PMH are also affecting patient's functional outcome.   REHAB POTENTIAL: Good  CLINICAL DECISION MAKING: Stable/uncomplicated  EVALUATION COMPLEXITY: Moderate   GOALS: Goals reviewed with patient? Yes   SHORT TERM GOALS: (target date for Short term goals 07/17/2024)    1.  Patient will demonstrate  independent use of home exercise program to maintain progress from in clinic treatments. Goal status: Ongoing 07/08/24  2. Left knee PROM extension -2* and flexion 105* Goal status: Ongoing (progressing) 07/08/24  LONG TERM GOALS: (target dates for all long term goals  09/04/2024)   1. Patient will demonstrate/report pain at worst less than or equal to 2/10 to facilitate minimal limitation in daily activity secondary to pain symptoms. Goal status: Ongoing 07/08/24   2. Patient will demonstrate independent use of home exercise program to facilitate ability to maintain/progress functional gains from skilled physical therapy services. Goal status: Ongoing 07/08/24   3. Patient will demonstrate Patient specific functional scale avg > or = 5 to indicate reduced disability due to condition.  Goal status: Ongoing 07/08/24   4.  Patient will demonstrate Lt knee LE MMT 5/5 throughout to faciltiate usual transfers, stairs, squatting at [facility] for daily life.  Goal status: Ongoing 07/08/24   5.  Patient ambulates > 500 ft including negotiating ramps and curbs without an assistive device independently. Goal status: Ongoing 07/08/24   6.  Patient negotiates stairs alternating patterning single rail  modified independent. Goal status: Ongoing 07/08/24   7.  Left knee AROM extension 0* and flexion 110* Goal Status: Ongoing 07/08/24   PLAN:  PT FREQUENCY: 2x/week  PT DURATION: 10 weeks  PLANNED INTERVENTIONS: 97164- PT Re-evaluation, 97750- Physical Performance Testing, 97110-Therapeutic exercises, 97530- Therapeutic activity, [id number]- Neuromuscular re-education, 97535- Self Care, [id number]- Manual therapy, [id number]- Gait training, [phone]- Electrical stimulation (unattended), [phone]- Electrical stimulation (manual), 97016- Vasopneumatic device, Patient/Family education, Balance training, Stair training, Taping, Joint mobilization, Scar mobilization, DME instructions, Cryotherapy, and Moist heat  PLAN FOR NEXT SESSION:   Follow up on HEP and continue to progress. Continue functional strengthening. Check ROM & STG.    Sherline Babe, Student-PT 07/08/2024, 3:57 PM  This entire session of physical therapy was performed under the direct supervision of PT signing evaluation /treatment. PT reviewed note and agrees.   Grayce Spatz, PT, DPT 07/08/2024, 5:04 PM  "

## 2024-07-08 ENCOUNTER — Ambulatory Visit: Admitting: Physical Therapy

## 2024-07-08 ENCOUNTER — Encounter: Payer: Self-pay | Admitting: Physical Therapy

## 2024-07-08 ENCOUNTER — Other Ambulatory Visit: Payer: Self-pay | Admitting: Physician Assistant

## 2024-07-08 DIAGNOSIS — R600 Localized edema: Secondary | ICD-10-CM

## 2024-07-08 DIAGNOSIS — M25662 Stiffness of left knee, not elsewhere classified: Secondary | ICD-10-CM | POA: Diagnosis not present

## 2024-07-08 DIAGNOSIS — R2689 Other abnormalities of gait and mobility: Secondary | ICD-10-CM

## 2024-07-08 DIAGNOSIS — M25562 Pain in left knee: Secondary | ICD-10-CM

## 2024-07-08 DIAGNOSIS — M6281 Muscle weakness (generalized): Secondary | ICD-10-CM

## 2024-07-08 MED ORDER — HYDROCODONE-ACETAMINOPHEN 5-325 MG PO TABS: 1.0000 | ORAL_TABLET | Freq: Two times a day (BID) | ORAL | 0 refills | Status: DC | PRN

## 2024-07-10 ENCOUNTER — Other Ambulatory Visit: Payer: Self-pay | Admitting: Physician Assistant

## 2024-07-10 ENCOUNTER — Ambulatory Visit

## 2024-07-10 DIAGNOSIS — M25562 Pain in left knee: Secondary | ICD-10-CM | POA: Diagnosis not present

## 2024-07-10 DIAGNOSIS — R600 Localized edema: Secondary | ICD-10-CM

## 2024-07-10 DIAGNOSIS — R2689 Other abnormalities of gait and mobility: Secondary | ICD-10-CM

## 2024-07-10 DIAGNOSIS — M6281 Muscle weakness (generalized): Secondary | ICD-10-CM | POA: Diagnosis not present

## 2024-07-10 DIAGNOSIS — M25662 Stiffness of left knee, not elsewhere classified: Secondary | ICD-10-CM

## 2024-07-10 NOTE — Therapy (Signed)
 " OUTPATIENT PHYSICAL THERAPY LOWER EXTREMITY TREATMENT   Patient Name: [name] [name] MRN: [id number] DOB:1980-12-24, 44 y.o., female Today's Date: 07/10/2024  END OF SESSION:  PT End of Session - 07/10/24 1433     Visit Number 4    Number of Visits 20    Date for Recertification  09/04/24    Authorization Type [employer] Blue [employer] COMM PPO    Authorization Time Period AUTH NEEDED $100 COPAY  BCBS DED MET, OOP Individual Remaining $3,734.06    PT Start Time 1433    PT Stop Time 1523    PT Time Calculation (min) 50 min    Activity Tolerance Patient tolerated treatment well    Behavior During Therapy WFL for tasks assessed/performed             Past Medical History:  Diagnosis Date   ADHD (attention deficit hyperactivity disorder)    Arthritis    Depression    Ectopic pregnancy    History of kidney stones    Hypertension    Past Surgical History:  Procedure Laterality Date   TOTAL HIP ARTHROPLASTY Right 08/31/2019   Procedure: RIGHT TOTAL HIP ARTHROPLASTY ANTERIOR APPROACH;  Surgeon: Jerri Kay HERO, MD;  Location: [facility] OR;  Service: Orthopedics;  Laterality: Right;   TOTAL HIP ARTHROPLASTY Left 11/04/2023   Procedure: ARTHROPLASTY, HIP, TOTAL, ANTERIOR APPROACH;  Surgeon: Jerri Kay HERO, MD;  Location: [facility] OR;  Service: Orthopedics;  Laterality: Left;   TOTAL KNEE ARTHROPLASTY Left 06/08/2024   Procedure: ARTHROPLASTY, KNEE, TOTAL;  Surgeon: Jerri Kay HERO, MD;  Location: [facility] OR;  Service: Orthopedics;  Laterality: Left;   WISDOM TOOTH EXTRACTION     Patient Active Problem List   Diagnosis Date Noted   Status post total left knee replacement 06/08/2024   Primary osteoarthritis of left knee 04/21/2024   Chronic pain of both knees 01/07/2024   Status post total replacement of left hip 08/09/2020   Status post total replacement of right hip 08/31/2019   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) 08/05/2019   Elevated blood pressure reading in office  without diagnosis of hypertension 08/05/2019   Attention deficit hyperactivity disorder (ADHD) 06/22/2015    PCP: Loris Elsie PARAS, PA-C  REFERRING PROVIDER: Jule Ronal LITTIE DEVONNA  REFERRING DIAG: [id number] (ICD-10-CM) - Status post total left knee replacement   THERAPY DIAG:  Stiffness of left knee, not elsewhere classified  Other abnormalities of gait and mobility  Acute pain of left knee  Muscle weakness (generalized)  Localized edema  Rationale for Evaluation and Treatment: Rehabilitation  ONSET DATE: 06/08/2024 L TKA  SUBJECTIVE:   SUBJECTIVE STATEMENT:  Patient reports that her knee isn't feeling too bad today. Patient endorses that her Rt knee has started to hurt more than the Lt.     PERTINENT HISTORY: 06/08/2024 Lt TKA, 08/31/2019 Rt THA, 11/04/2023 Lt THA, Hypertension, Arthritis, Depression, ADHD  DIAGNOSTIC FINDINGS: X-ray 06/08/2024 s/p TKA Left knee arthroplasty in expected alignment. No periprosthetic lucency or fracture. Recent postsurgical change includes air and edema in the soft tissues and joint space  PAIN:   NPRS scale: 5/10  Lt knee, 5/10 Rt knee  Pain location: Pain in patella and anterior knee Pain description: Sharp pain  Aggravating factors: Standing for long periods  Relieving factors: Sitting, OTC (Tylenol )   PRECAUTIONS: Knee  WEIGHT BEARING RESTRICTIONS: No  FALLS:  Has patient fallen in last 6 months? 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 10 represents able to perform at prior level. 0 1 2 3 4 5 6 7 8 9  10 (Date and Score)  Activity Eval  06/24/24    1. Ability to stand (ADL) 2     2. Walking 2     3. Stairs 1   4. Work 0   5.    Score 1.3    Total score = sum of the activity scores/number of activities Minimum detectable change (90%CI) for average score = 2 points Minimum detectable change (90%CI) for single activity score = 3 points  COGNITION: Overall cognitive status: WFL    SENSATION: WFL  EDEMA:  Circumferential:   RLE: above knee  46 cm  around knee 44.2 cm  below knee 39.9 cm LLE: above knee 51 cm  around knee 48.5 cm  below knee 40.7 cm  POSTURE:  weight shift right  PALPATION: Mild tenderness and swelling around Lt knee during palpation.   LOWER EXTREMITY ROM:   ROM Left eval Left 06/30/24  Hip flexion    Hip extension    Hip abduction    Hip adduction    Hip internal rotation    Hip external rotation    Knee flexion Seated: A: 85* P: 92*  Supine: A: 89* P: 98*  Supine: A: 95* P: 101*   Knee extension Seated  LAQ A: -25*  Supine: Quad set: -12* P: -7* Supine: A: -5* P: -2*  Ankle dorsiflexion    Ankle plantarflexion    Ankle inversion    Ankle eversion     (Blank rows = not tested)  LOWER EXTREMITY MMT:  MMT Left eval  Hip flexion   Hip extension   Hip abduction   Hip adduction   Hip internal rotation   Hip external rotation   Knee flexion 3-/5  Knee extension 3-/5  Ankle dorsiflexion   Ankle plantarflexion   Ankle inversion   Ankle eversion    (Blank rows = not tested)  FUNCTIONAL TESTS:  18 inch chair transfer:  Requires use of arm rest with minimal engagement of LLE  GAIT: Distance walked: 20 ft; comfortable 2.23 ft/s; fast 2.61 ft/s Assistive device utilized: Single point cane with heavy UE WB support Level of assistance: SBA needs cues for deviations Comments:  Antalgic gait pattern and decreased stance duration LLE, left knee flexed in stance, with decreased flexion range in LLE during swing  TODAY'S TREATMENT  07/10/2024 TherEx:  UBE with bilat LE only seat 8 for 8 minutes  Seated LAQ with 3# weights on ankles performing bilat with reciprocal motion 1x20 each side   TherAct:  Bilat leg press 2x12 with 87#  Unilat leg press 2x12 with 37# performed bilaterally  Step up and over with 4 step 1x10 with each foot staying on step  Step up and over with 6 step 1x10 with Lt LE staying on step   Manual:  Seated knee flexion with IR/distraction with 10s holds into overpressure and PT performing knee extension PROM between; patient also using percussive device to quad  Vaso:  Lt knee elevated on wedge for 10 minutes with medium compression at 34deg    07/08/2024 TherEx Recumbent bike; seat 6 level 3 8 min  Seated Hamstring Curl with Anchored Resistance; green 2 sets 10 reps.  Standing Terminal Knee Extension with Resistance; green 2 sets 10 reps  Progression: Standing TKE with resistance > single leg stance on Lt LE; green 2 sets 10 reps  Alternating LAQ and active knee flexion with contralateral LE opposing motion to facilitate better muscle contraction; 3lb ankle weight 2 sets 10 reps  Updated HEP and patient received HO and green theraband for home. Patient demonstrated and verbalized understanding.  TherAct Leg press machine; BLE 106 lbs; 1 set 10 reps Leg press machine; LLE 37 lbs; 2 set 10 reps Sit <> Stand from standard chair with arms crossed; 1 set 10 reps. PT verbal cue and demonstration to unlock knees during sitting and reach trunk forward over toes for improved transition.   Manual  Seated knee flexion and tibial IR with massage gun facilitation in quadriceps to decrease muscle guarding and increase range   Long sitting Lt. Knee extension; towel underneath pt. Lt. Ankle; PT add overpressure at knee with combined IR of the tibia and ER of the femur  Modalities Vaso Lt knee elevation; 10 mins med compression 34*   TREATMENT   06/30/2024  TherEx SciFit bike, 4 mins with BUE asssist and 4 mins BLE only; seat 8  Alternating LAQ and active knee flexion with contralateral LE opposing motion to facilitate better muscle contraction; 2 x 10 ; 1 set with 3 lb weight and 1 set without weight ROM measurements (see in objective section)  Therapeutic Activities: Patient education on loading and weight shift on bilateral knees after bike exercise to help with joint unloading and compression and reduce a stiff gait pattern. PT & pt noted pt had less antalgic gait pattern with initiating after weight shift.  Leg press machine; BLE 100 lbs; 1 x 10  Leg press machine; LLE 37 lbs; 1 x 10   Manual  Seated Lt. knee flexion and tibial IR. Thigh and other arm providing overpressure into flexion and IR to improve knee flexion ROM Long sitting Lt. Knee extension; towel underneath pt. Lt. Ankle; PT add overpressure at knee with combined IR of the tibia and ER of the femur  Modalities Vaso Lt knee elevation; 10 mins med compression 34*   TREATMENT  DATE: 06/24/2024 Therapeutic Exercise: HEP instruction/performance c cues for techniques, handout provided.  Trial set performed of each for comprehension and symptom assessment.  See below for exercise list  PATIENT EDUCATION:  Education details: HEP, POC Person educated: Patient Education method: Explanation, Demonstration, Verbal cues, and Handouts Education comprehension: verbalized understanding, returned demonstration, and verbal cues required  HOME EXERCISE PROGRAM: Access Code: [email or web address] URL: [email or web address] Date: 07/08/2024 Prepared by: Sherline Babe  Exercises -  Ankle Alphabet in Elevation  - 2-4 x daily - 7 x weekly - 1 sets - 1 reps - supine quad set with towel roll under ankle  - 2-4 x daily - 7 x weekly - 2-3 sets - 10 reps - 5 seconds hold - Supine Heel Slide with Strap  - 2-3 x daily - 7 x weekly - 2-3 sets - 10 reps - 5 seconds hold - Supine Straight Leg Raises  - 2-3 x daily - 7 x weekly - 2-3 sets - 10 reps - 5 seconds hold - Seated Knee Flexion Extension AROM   - 2-4 x daily - 7 x weekly - 2-3 sets - 10 reps - 5 seconds hold - Seated Long Arc Quad  - 2-4 x daily - 7 x weekly - 2-3 sets - 10 reps - 5 seconds hold - Seated Active Straight-Leg Raise  - 2-4 x daily - 7 x weekly - 2-3 sets - 10 reps - 5 seconds hold - Standing Terminal Knee Extension with Resistance  - 1-2 x daily - 7 x weekly - 2-3 sets - 10 reps - 3-5 hold - Seated Hamstring Curl with Anchored Resistance  - 1-2 x daily - 7 x weekly - 2-3 sets - 10 reps - 3-5 hold - Sit to Stand with Arms Crossed  - 1-2 x daily - 7 x weekly - 2-3 sets - 10 reps  ASSESSMENT:  CLINICAL IMPRESSION:  Patient arrived to session noting improvement in Lt knee, though still sore, with the Rt knee starting to have an increase in pain and soreness. Patient tolerated all activities this date with limitations noted during Rt knee strengthening and functional tasks. Patient will continue to benefit from skilled PT.  OBJECTIVE IMPAIRMENTS: Abnormal gait, decreased activity tolerance, decreased balance, decreased knowledge of condition, decreased knowledge of use of DME, decreased mobility, difficulty walking, decreased ROM, decreased strength, increased edema, postural dysfunction, and pain.   ACTIVITY LIMITATIONS: carrying, lifting, bending, sitting, standing, squatting, sleeping, stairs, transfers, and locomotion level  PARTICIPATION LIMITATIONS: meal prep, cleaning, laundry, shopping, community activity, and occupation  PERSONAL FACTORS: Fitness, Past/current experiences, and 3+ comorbidities: see PMH are  also affecting patient's functional outcome.   REHAB POTENTIAL: Good  CLINICAL DECISION MAKING: Stable/uncomplicated  EVALUATION COMPLEXITY: Moderate   GOALS: Goals reviewed with patient? Yes   SHORT TERM GOALS: (target date for Short term goals 07/17/2024)    1.  Patient will demonstrate independent use of home exercise program to maintain progress from in clinic treatments. Goal status: Ongoing 07/08/24  2. Left knee PROM extension -2* and flexion 105* Goal status: Ongoing (progressing) 07/08/24  LONG TERM GOALS: (target dates for all long term goals  09/04/2024)   1. Patient will demonstrate/report pain at worst less than or equal to 2/10 to facilitate minimal limitation in daily activity secondary to pain symptoms. Goal status: Ongoing 07/08/24   2. Patient will demonstrate independent use of home exercise program to facilitate ability to maintain/progress functional gains from skilled physical therapy services. Goal status: Ongoing 07/08/24  3. Patient will demonstrate Patient specific functional scale avg > or = 5 to indicate reduced disability due to condition.  Goal status: Ongoing 07/08/24   4.  Patient will demonstrate Lt knee LE MMT 5/5 throughout to faciltiate usual transfers, stairs, squatting at [facility] for daily life.  Goal status: Ongoing 07/08/24   5.  Patient ambulates > 500 ft including negotiating ramps and curbs without an assistive device independently. Goal status: Ongoing 07/08/24   6.  Patient negotiates stairs alternating patterning single rail modified independent. Goal status: Ongoing 07/08/24   7.  Left knee AROM extension 0* and flexion 110* Goal Status: Ongoing 07/08/24   PLAN:  PT FREQUENCY: 2x/week  PT DURATION: 10 weeks  PLANNED INTERVENTIONS: 97164- PT Re-evaluation, 97750- Physical Performance Testing, 97110-Therapeutic exercises, 97530- Therapeutic activity, [id number]- Neuromuscular re-education, 97535- Self Care, [id number]- Manual therapy, [id number]-  Gait training, [phone]- Electrical stimulation (unattended), [phone]- Electrical stimulation (manual), 97016- Vasopneumatic device, Patient/Family education, Balance training, Stair training, Taping, Joint mobilization, Scar mobilization, DME instructions, Cryotherapy, and Moist heat  PLAN FOR NEXT SESSION:   Follow up on HEP and continue to progress. Continue functional strengthening. Check ROM & STG.   Susannah Daring, PT, DPT 07/10/24 3:35 PM    "

## 2024-07-11 ENCOUNTER — Other Ambulatory Visit: Payer: Self-pay | Admitting: Physician Assistant

## 2024-07-13 ENCOUNTER — Other Ambulatory Visit (HOSPITAL_COMMUNITY)

## 2024-07-14 ENCOUNTER — Encounter: Admitting: Physical Therapy

## 2024-07-14 NOTE — Therapy (Incomplete)
 " OUTPATIENT PHYSICAL THERAPY LOWER EXTREMITY TREATMENT   Patient Name: [name] [name] MRN: [id number] DOB:[birth year], 44 y.o., female Today's Date: 07/14/2024  END OF SESSION:       Past Medical History:  Diagnosis Date   ADHD (attention deficit hyperactivity disorder)    Arthritis    Depression    Ectopic pregnancy    History of kidney stones    Hypertension    Past Surgical History:  Procedure Laterality Date   TOTAL HIP ARTHROPLASTY Right 08/31/2019   Procedure: RIGHT TOTAL HIP ARTHROPLASTY ANTERIOR APPROACH;  Surgeon: Jerri Kay HERO, MD;  Location: [facility] OR;  Service: Orthopedics;  Laterality: Right;   TOTAL HIP ARTHROPLASTY Left 11/04/2023   Procedure: ARTHROPLASTY, HIP, TOTAL, ANTERIOR APPROACH;  Surgeon: Jerri Kay HERO, MD;  Location: [facility] OR;  Service: Orthopedics;  Laterality: Left;   TOTAL KNEE ARTHROPLASTY Left 06/08/2024   Procedure: ARTHROPLASTY, KNEE, TOTAL;  Surgeon: Jerri Kay HERO, MD;  Location: [facility] OR;  Service: Orthopedics;  Laterality: Left;   WISDOM TOOTH EXTRACTION     Patient Active Problem List   Diagnosis Date Noted   Status post total left knee replacement 06/08/2024   Primary osteoarthritis of left knee 04/21/2024   Chronic pain of both knees 01/07/2024   Status post total replacement of left hip 08/09/2020   Status post total replacement of right hip 08/31/2019   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  Rationale for Evaluation and Treatment: Rehabilitation  ONSET DATE: 06/08/2024 L TKA  SUBJECTIVE:   SUBJECTIVE STATEMENT: *** Patient reports that her knee isn't feeling  too bad today. Patient endorses that her Rt knee has started to hurt more than the Lt.     PERTINENT HISTORY: 06/08/2024 Lt TKA, 08/31/2019 Rt THA, 11/04/2023 Lt THA, Hypertension, Arthritis, Depression, ADHD  DIAGNOSTIC FINDINGS: X-ray 06/08/2024 s/p TKA Left knee arthroplasty in expected alignment. No periprosthetic lucency or fracture. Recent postsurgical change includes air and edema in the soft tissues and joint space  PAIN:  *** NPRS scale: 5/10  Lt knee, 5/10 Rt knee  Pain location: Pain in patella and anterior knee Pain description: Sharp pain  Aggravating factors: Standing for long periods  Relieving factors: Sitting, OTC (Tylenol )   PRECAUTIONS: Knee  WEIGHT BEARING RESTRICTIONS: No  FALLS:  Has patient fallen in last 6 months? 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 10 represents able to perform at prior level. 0 1 2 3 4 5 6 7 8 9  10 (Date and Score)  Activity Eval  06/24/24    1. Ability to stand (ADL) 2     2. Walking 2  3. Stairs 1   4. Work 0   5.    Score 1.3    Total score = sum of the activity scores/number of activities Minimum detectable change (90%CI) for average score = 2 points Minimum detectable change (90%CI) for single activity score = 3 points  COGNITION: Overall cognitive status: WFL    SENSATION: WFL  EDEMA:  Circumferential:   RLE: above knee  46 cm  around knee 44.2  cm  below knee 39.9 cm LLE: above knee 51 cm  around knee 48.5 cm  below knee 40.7 cm  POSTURE:  weight shift right  PALPATION: Mild tenderness and swelling around Lt knee during palpation.   LOWER EXTREMITY ROM:   ROM Left eval Left 06/30/24 Left 07/14/24  Hip flexion     Hip extension     Hip abduction     Hip adduction     Hip internal rotation     Hip external rotation     Knee flexion Seated: A: 85* P: 92*  Supine: A: 89* P: 98*  Supine: A: 95* P: 101*  ***Seated P:  Knee extension Seated  LAQ A: -25*  Supine: Quad set: -12* P: -7* Supine: A: -5* P: -2* Seated P:  Ankle dorsiflexion     Ankle plantarflexion     Ankle inversion     Ankle eversion      (Blank rows = not tested)  LOWER EXTREMITY MMT:  MMT Left eval  Hip flexion   Hip extension   Hip abduction   Hip adduction   Hip internal rotation   Hip external rotation   Knee flexion 3-/5  Knee extension 3-/5  Ankle dorsiflexion   Ankle plantarflexion   Ankle inversion   Ankle eversion    (Blank rows = not tested)  FUNCTIONAL TESTS:  18 inch chair transfer:  Requires use of arm rest with minimal engagement of LLE  GAIT: Distance walked: 20 ft; comfortable 2.23 ft/s; fast 2.61 ft/s Assistive device utilized: Single point cane with heavy UE WB support Level of assistance: SBA needs cues for deviations Comments: Antalgic gait pattern and decreased stance duration LLE, left knee flexed in stance, with decreased flexion range in LLE during swing                                                                                                                                     TODAY'S TREATMENT  07/14/24 TherEx TherAct  TREATMENT  07/10/2024 TherEx:  UBE with bilat LE only seat 8 for 8 minutes  Seated LAQ with 3# weights on ankles performing bilat with reciprocal motion 1x20 each side   TherAct:  Bilat leg press 2x12 with 87#  Unilat leg press 2x12 with 37# performed bilaterally  Step  up and over with 4 step 1x10 with each foot staying on step  Step up and over with 6 step 1x10 with Lt LE staying on  step   Manual:  Seated knee flexion with IR/distraction with 10s holds into overpressure and PT performing knee extension PROM between; patient also using percussive device to quad  Vaso:  Lt knee elevated on wedge for 10 minutes with medium compression at 34deg    07/08/2024 TherEx Recumbent bike; seat 6 level 3 8 min  Seated Hamstring Curl with Anchored Resistance; green 2 sets 10 reps.  Standing Terminal Knee Extension with Resistance; green 2 sets 10 reps  Progression: Standing TKE with resistance > single leg stance on Lt LE; green 2 sets 10 reps  Alternating LAQ and active knee flexion with contralateral LE opposing motion to facilitate better muscle contraction; 3lb ankle weight 2 sets 10 reps  Updated HEP and patient received HO and green theraband for home. Patient demonstrated and verbalized understanding.  TherAct Leg press machine; BLE 106 lbs; 1 set 10 reps Leg press machine; LLE 37 lbs; 2 set 10 reps Sit <> Stand from standard chair with arms crossed; 1 set 10 reps. PT verbal cue and demonstration to unlock knees during sitting and reach trunk forward over toes for improved transition.   Manual  Seated knee flexion and tibial IR with massage gun facilitation in quadriceps to decrease muscle guarding and increase range  Long sitting Lt. Knee extension; towel underneath pt. Lt. Ankle; PT add overpressure at knee with combined IR of the tibia and ER of the femur  Modalities Vaso Lt knee elevation; 10 mins med compression 34*   TREATMENT   06/30/2024  TherEx SciFit bike, 4 mins with BUE asssist and 4 mins BLE only; seat 8  Alternating LAQ and active knee flexion with contralateral LE opposing motion to facilitate better muscle contraction; 2 x 10 ; 1 set with 3 lb weight and 1 set without weight ROM measurements (see in objective section)  Therapeutic  Activities: Patient education on loading and weight shift on bilateral knees after bike exercise to help with joint unloading and compression and reduce a stiff gait pattern. PT & pt noted pt had less antalgic gait pattern with initiating after weight shift.  Leg press machine; BLE 100 lbs; 1 x 10  Leg press machine; LLE 37 lbs; 1 x 10   Manual  Seated Lt. knee flexion and tibial IR. Thigh and other arm providing overpressure into flexion and IR to improve knee flexion ROM Long sitting Lt. Knee extension; towel underneath pt. Lt. Ankle; PT add overpressure at knee with combined IR of the tibia and ER of the femur  Modalities Vaso Lt knee elevation; 10 mins med compression 34*   TREATMENT                                                                          DATE: 06/24/2024 Therapeutic Exercise: HEP instruction/performance c cues for techniques, handout provided.  Trial set performed of each for comprehension and symptom assessment.  See below for exercise list  PATIENT EDUCATION:  Education details: HEP, POC Person educated: Patient Education method: Explanation, Demonstration, Verbal cues, and Handouts Education comprehension: verbalized understanding, returned demonstration, and verbal cues required  HOME EXERCISE PROGRAM: Access Code: [email or web address] URL: [email or web address] Date: 07/08/2024 Prepared by: Sherline Babe  Exercises - Ankle Alphabet in Elevation  -  2-4 x daily - 7 x weekly - 1 sets - 1 reps - supine quad set with towel roll under ankle  - 2-4 x daily - 7 x weekly - 2-3 sets - 10 reps - 5 seconds hold - Supine Heel Slide with Strap  - 2-3 x daily - 7 x weekly - 2-3 sets - 10 reps - 5 seconds hold - Supine Straight Leg Raises  - 2-3 x daily - 7 x weekly - 2-3 sets - 10 reps - 5 seconds hold - Seated Knee Flexion Extension AROM   - 2-4 x daily - 7 x weekly - 2-3 sets - 10 reps - 5 seconds hold - Seated Long Arc Quad  - 2-4 x daily - 7 x weekly - 2-3 sets -  10 reps - 5 seconds hold - Seated Active Straight-Leg Raise  - 2-4 x daily - 7 x weekly - 2-3 sets - 10 reps - 5 seconds hold - Standing Terminal Knee Extension with Resistance  - 1-2 x daily - 7 x weekly - 2-3 sets - 10 reps - 3-5 hold - Seated Hamstring Curl with Anchored Resistance  - 1-2 x daily - 7 x weekly - 2-3 sets - 10 reps - 3-5 hold - Sit to Stand with Arms Crossed  - 1-2 x daily - 7 x weekly - 2-3 sets - 10 reps  ASSESSMENT:  CLINICAL IMPRESSION: *** Patient arrived to session noting improvement in Lt knee, though still sore, with the Rt knee starting to have an increase in pain and soreness. Patient tolerated all activities this date with limitations noted during Rt knee strengthening and functional tasks. Patient will continue to benefit from skilled PT.  OBJECTIVE IMPAIRMENTS: Abnormal gait, decreased activity tolerance, decreased balance, decreased knowledge of condition, decreased knowledge of use of DME, decreased mobility, difficulty walking, decreased ROM, decreased strength, increased edema, postural dysfunction, and pain.   ACTIVITY LIMITATIONS: carrying, lifting, bending, sitting, standing, squatting, sleeping, stairs, transfers, and locomotion level  PARTICIPATION LIMITATIONS: meal prep, cleaning, laundry, shopping, community activity, and occupation  PERSONAL FACTORS: Fitness, Past/current experiences, and 3+ comorbidities: see PMH are also affecting patient's functional outcome.   REHAB POTENTIAL: Good  CLINICAL DECISION MAKING: Stable/uncomplicated  EVALUATION COMPLEXITY: Moderate   GOALS: *** Goals reviewed with patient? Yes   SHORT TERM GOALS: (target date for Short term goals 07/17/2024)    1.  Patient will demonstrate independent use of home exercise program to maintain progress from in clinic treatments. Goal status: Ongoing 07/08/24  2. Left knee PROM extension -2* and flexion 105* Goal status: Ongoing (progressing) 07/08/24  LONG TERM GOALS: (target  dates for all long term goals  09/04/2024)   1. Patient will demonstrate/report pain at worst less than or equal to 2/10 to facilitate minimal limitation in daily activity secondary to pain symptoms. Goal status: Ongoing 07/08/24   2. Patient will demonstrate independent use of home exercise program to facilitate ability to maintain/progress functional gains from skilled physical therapy services. Goal status: Ongoing 07/08/24   3. Patient will demonstrate Patient specific functional scale avg > or = 5 to indicate reduced disability due to condition.  Goal status: Ongoing 07/08/24   4.  Patient will demonstrate Lt knee LE MMT 5/5 throughout to faciltiate usual transfers, stairs, squatting at [facility] for daily life.  Goal status: Ongoing 07/08/24   5.  Patient ambulates > 500 ft including negotiating ramps and curbs without an assistive device independently. Goal status: Ongoing 07/08/24   6.  Patient negotiates stairs alternating patterning single rail modified independent. Goal status: Ongoing 07/08/24   7.  Left knee AROM extension 0* and flexion 110* Goal Status: Ongoing 07/08/24   PLAN:  PT FREQUENCY: 2x/week  PT DURATION: 10 weeks  PLANNED INTERVENTIONS: 97164- PT Re-evaluation, 97750- Physical Performance Testing, 97110-Therapeutic exercises, 97530- Therapeutic activity, [id number]- Neuromuscular re-education, 97535- Self Care, [id number]- Manual therapy, [id number]- Gait training, [phone]- Electrical stimulation (unattended), [phone]- Electrical stimulation (manual), 97016- Vasopneumatic device, Patient/Family education, Balance training, Stair training, Taping, Joint mobilization, Scar mobilization, DME instructions, Cryotherapy, and Moist heat  PLAN FOR NEXT SESSION:   Follow up on HEP and continue to progress. Continue functional strengthening. Check ROM & STG.    Sign***   "

## 2024-07-15 ENCOUNTER — Encounter: Admitting: Physical Therapy

## 2024-07-15 ENCOUNTER — Ambulatory Visit: Admitting: Physician Assistant

## 2024-07-15 NOTE — Therapy (Incomplete)
 " OUTPATIENT PHYSICAL THERAPY LOWER EXTREMITY TREATMENT   Patient Name: [name] [name] MRN: [id number] DOB:21-Dec-1980, 44 y.o., female Today's Date: 07/16/2024  END OF SESSION:  PT End of Session - 07/16/24 1148     Visit Number 5    Number of Visits 20    Date for Recertification  09/04/24    Authorization Type Blue [employer] Blue [employer] COMM PPO    Authorization Time Period AUTH NEEDED $100 COPAY  BCBS DED MET, OOP Individual Remaining $3,734.06    PT Start Time 1146    PT Stop Time 1231    PT Time Calculation (min) 45 min    Activity Tolerance Patient tolerated treatment well    Behavior During Therapy WFL for tasks assessed/performed              Past Medical History:  Diagnosis Date   ADHD (attention deficit hyperactivity disorder)    Arthritis    Depression    Ectopic pregnancy    History of kidney stones    Hypertension    Past Surgical History:  Procedure Laterality Date   TOTAL HIP ARTHROPLASTY Right 08/31/2019   Procedure: RIGHT TOTAL HIP ARTHROPLASTY ANTERIOR APPROACH;  Surgeon: Jerri Kay HERO, MD;  Location: [facility];  Service: Orthopedics;  Laterality: Right;   TOTAL HIP ARTHROPLASTY Left 11/04/2023   Procedure: ARTHROPLASTY, HIP, TOTAL, ANTERIOR APPROACH;  Surgeon: Jerri Kay HERO, MD;  Location: [facility];  Service: Orthopedics;  Laterality: Left;   TOTAL KNEE ARTHROPLASTY Left 06/08/2024   Procedure: ARTHROPLASTY, KNEE, TOTAL;  Surgeon: Jerri Kay HERO, MD;  Location: [facility];  Service: Orthopedics;  Laterality: Left;   WISDOM TOOTH EXTRACTION     Patient Active Problem List   Diagnosis Date Noted   Status post total left knee replacement 06/08/2024   Primary osteoarthritis of left knee 04/21/2024   Chronic pain of both knees 01/07/2024   Status post total replacement of left hip 08/09/2020   Status post total replacement of right hip 08/31/2019   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) 08/05/2019   Elevated blood pressure reading in office  without diagnosis of hypertension 08/05/2019   Attention deficit hyperactivity disorder (ADHD) 06/22/2015    PCP: Loris Elsie PARAS, PA-C  REFERRING PROVIDER: Jule Ronal LITTIE DEVONNA  REFERRING DIAG: [phone] (ICD-10-CM) - Status post total left knee replacement   THERAPY DIAG:  Other abnormalities of gait and mobility  Acute pain of left knee  Stiffness of left knee, not elsewhere classified  Localized edema  Muscle weakness (generalized)  Rationale for Evaluation and Treatment: Rehabilitation  ONSET DATE: 06/08/2024 L TKA  SUBJECTIVE:   SUBJECTIVE STATEMENT:  Patient states that Lt knee is feeling good, but is having increased pain in Rt knee. Tried exercises on Rt knee that was given for Lt knee but seems to have irritated the knee than help. Walking is uncomfortable because of Rt knee. Looking into surgery for Rt knee ASAP.   PERTINENT HISTORY: 06/08/2024 Lt TKA, 08/31/2019 Rt THA, 11/04/2023 Lt THA, Hypertension, Arthritis, Depression, ADHD  DIAGNOSTIC FINDINGS: X-ray 06/08/2024 s/p TKA Left knee arthroplasty in expected alignment. No periprosthetic lucency or fracture. Recent postsurgical change includes air and edema in the soft tissues and joint space  PAIN:   NPRS scale: Since last visit patient has no pain in the Lt knee; but increase pain in the Rt knee 5-6/10. Pain location: Pain in patella and anterior knee Pain description: Sharp pain  Aggravating factors: Standing for long periods  Relieving factors: Sitting, OTC (Tylenol )   PRECAUTIONS: Knee  WEIGHT BEARING RESTRICTIONS: No  FALLS:  Has patient fallen in last 6 months? 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 10 represents able to perform at prior level. 0 1 2 3 4 5 6 7 8 9  10 (Date and Score)  Activity Eval  06/24/24    1. Ability to stand (ADL) 2     2. Walking 2     3. Stairs 1   4. Work 0   5.    Score 1.3    Total score = sum of the activity scores/number of activities Minimum detectable change (90%CI) for average score = 2 points Minimum detectable change (90%CI) for single activity score = 3 points  COGNITION: Overall cognitive status: WFL    SENSATION: WFL  EDEMA:  Circumferential:   RLE: above knee  46 cm  around knee 44.2 cm  below knee 39.9 cm LLE: above knee 51 cm  around knee 48.5 cm  below knee 40.7 cm  POSTURE:  weight shift right  PALPATION: Mild tenderness and swelling around Lt knee during palpation.   LOWER EXTREMITY ROM:   ROM Left eval Left 06/30/24 Left 07/16/24  Hip flexion     Hip extension     Hip abduction     Hip adduction     Hip internal rotation     Hip external rotation     Knee flexion Seated: A: 85* P: 92*  Supine: A: 89* P: 98*  Supine: A: 95* P: 101*  Seated P: 115*  Knee extension Seated  LAQ A: -25*  Supine: Quad set: -12* P: -7* Supine: A: -5* P: -2* Seated P:0*  Ankle dorsiflexion     Ankle plantarflexion     Ankle inversion     Ankle eversion      (Blank rows = not tested)  LOWER EXTREMITY MMT:  MMT Left eval  Hip flexion   Hip extension   Hip abduction   Hip adduction   Hip internal rotation   Hip external rotation   Knee flexion 3-/5  Knee extension 3-/5  Ankle dorsiflexion   Ankle plantarflexion   Ankle inversion   Ankle eversion    (Blank rows = not tested)  FUNCTIONAL TESTS:  18 inch chair transfer:  Requires use  of arm rest with minimal engagement of LLE  GAIT: Distance walked: 20 ft; comfortable 2.23 ft/s; fast 2.61 ft/s Assistive device utilized: Single point cane with heavy UE WB support Level of assistance: SBA needs cues for deviations Comments: Antalgic gait pattern and decreased stance duration LLE, left knee flexed in stance, with decreased flexion range in LLE during swing  TODAY'S TREATMENT   07/16/24 TherEx Recumbent bike; seat 3 lvl 3 for 8 minutes  PT educated patient about use of stationary bike outside of PT to aid in strengthening & muscle endurance with suggestion for 30 minutes of exercise broken down into 3 x 10 min riding sessions with rest in between over time decreasing amount of rest needed. Patient verbalized understanding. PROM measurement (See objective data)  TherAct Double Leg Press 2 x 10 112# Single Leg Press 2 x 10 43# performed bilaterally  Pt amb 120 ft using a SPC-stand alone tip and 120 ft using a SPC - standard tip with supervision (step-through gait pattern) to experience the difference between DME modifications for pain management and community ambulation. PT demonstration and verbal cues for patterning; patient used 2 point-gait pattern and preferred use of SPC-stand alone tip.  PT educated patient about using cane for pain management due to noted increase in Rt knee pain in household and community. Patient verbalized understanding and plans to purchase stand alone tip for cane at home. Pt negotiated curb with supervision using a SPC-stand alone tip. PT demonstration and verbal cueing for patterning required. Ascending the curb with Lt leg followed by cane and Rt Leg. Descending with Rt leg and cane; PT verbal cue to bring ball of Lt foot over edge for optimal knee flexion and transition into walking.  Pt negotiated ramp with  supervision using a SPC-stand alone tip. PT demonstration and verbal cueing for smaller steps and posture.  Ascending the ramp using patterning done on flat surface and step-through gait pattern. PT cue for forward trunk lean. Descending the ramp PT cued for upright posture.  Sit to Stand with Arms Crossed; 1 set 5 reps noted improved equality of weight bearing BLEs Seated Hamstring Curl with Anchored Resistance; 1 set 10 reps green TB progress 2nd set  5 reps blue TB   Standing Terminal Knee Extension with Resistance; 1 set 10 reps green TB progress 2nd set 5 reps blue TB  PT reviewed HEP and progressed from green TB > blue TB given to patient. Patient demonstrated and verbalized understanding of HEP progression for strength.    TREATMENT  07/10/2024 TherEx:  UBE with bilat LE only seat 8 for 8 minutes  Seated LAQ with 3# weights on ankles performing bilat with reciprocal motion 1x20 each side   TherAct:  Bilat leg press 2x12 with 87#  Unilat leg press 2x12 with 37# performed bilaterally  Step up and over with 4 step 1x10 with each foot staying on step  Step up and over with 6 step 1x10 with Lt LE staying on step   Manual:  Seated knee flexion with IR/distraction with 10s holds into overpressure and PT performing knee extension PROM between; patient also using percussive device to quad  Vaso:  Lt knee elevated on wedge for 10 minutes with medium compression at 34deg   TREATMENT  07/08/2024 TherEx Recumbent bike; seat 6 level 3 8 min  Seated Hamstring Curl with Anchored Resistance; green 2 sets 10 reps.  Standing Terminal Knee Extension with Resistance; green 2 sets 10 reps  Progression: Standing TKE with resistance > single leg stance on Lt LE; green 2 sets 10 reps  Alternating LAQ and active knee flexion with contralateral LE opposing motion to facilitate better muscle contraction; 3lb ankle weight 2 sets 10 reps  Updated HEP and patient received HO and green theraband for home.  Patient demonstrated and verbalized understanding.  TherAct Leg press machine; BLE 106  lbs; 1 set 10 reps Leg press machine; LLE 37 lbs; 2 set 10 reps Sit <> Stand from standard chair with arms crossed; 1 set 10 reps. PT verbal cue and demonstration to unlock knees during sitting and reach trunk forward over toes for improved transition.   Manual  Seated knee flexion and tibial IR with massage gun facilitation in quadriceps to decrease muscle guarding and increase range  Long sitting Lt. Knee extension; towel underneath pt. Lt. Ankle; PT add overpressure at knee with combined IR of the tibia and ER of the femur  Modalities Vaso Lt knee elevation; 10 mins med compression 34*   TREATMENT   06/30/2024  TherEx SciFit bike, 4 mins with BUE asssist and 4 mins BLE only; seat 8  Alternating LAQ and active knee flexion with contralateral LE opposing motion to facilitate better muscle contraction; 2 x 10 ; 1 set with 3 lb weight and 1 set without weight ROM measurements (see in objective section)  Therapeutic Activities: Patient education on loading and weight shift on bilateral knees after bike exercise to help with joint unloading and compression and reduce a stiff gait pattern. PT & pt noted pt had less antalgic gait pattern with initiating after weight shift.  Leg press machine; BLE 100 lbs; 1 x 10  Leg press machine; LLE 37 lbs; 1 x 10   Manual  Seated Lt. knee flexion and tibial IR. Thigh and other arm providing overpressure into flexion and IR to improve knee flexion ROM Long sitting Lt. Knee extension; towel underneath pt. Lt. Ankle; PT add overpressure at knee with combined IR of the tibia and ER of the femur  Modalities Vaso Lt knee elevation; 10 mins med compression 34*   TREATMENT                                                                          DATE: 06/24/2024 Therapeutic Exercise: HEP instruction/performance c cues for techniques, handout provided.  Trial set performed  of each for comprehension and symptom assessment.  See below for exercise list  PATIENT EDUCATION:  Education details: HEP, POC Person educated: Patient Education method: Explanation, Demonstration, Verbal cues, and Handouts Education comprehension: verbalized understanding, returned demonstration, and verbal cues required  HOME EXERCISE PROGRAM: Access Code: [email or web address] URL: [email or web address] Date: 07/08/2024 Prepared by: Sherline Babe  Exercises - Ankle Alphabet in Elevation  - 2-4 x daily - 7 x weekly - 1 sets - 1 reps - supine quad set with towel roll under ankle  - 2-4 x daily - 7 x weekly - 2-3 sets - 10 reps - 5 seconds hold - Supine Heel Slide with Strap  - 2-3 x daily - 7 x weekly - 2-3 sets - 10 reps - 5 seconds hold - Supine Straight Leg Raises  - 2-3 x daily - 7 x weekly - 2-3 sets - 10 reps - 5 seconds hold - Seated Knee Flexion Extension AROM   - 2-4 x daily - 7 x weekly - 2-3 sets - 10 reps - 5 seconds hold - Seated Long Arc Quad  - 2-4 x daily - 7 x weekly - 2-3 sets - 10 reps - 5 seconds hold - Seated Active Straight-Leg Raise  -  2-4 x daily - 7 x weekly - 2-3 sets - 10 reps - 5 seconds hold - Standing Terminal Knee Extension with Resistance  - 1-2 x daily - 7 x weekly - 2-3 sets - 10 reps - 3-5 hold - Seated Hamstring Curl with Anchored Resistance  - 1-2 x daily - 7 x weekly - 2-3 sets - 10 reps - 3-5 hold - Sit to Stand with Arms Crossed  - 1-2 x daily - 7 x weekly - 2-3 sets - 10 reps  ASSESSMENT:  CLINICAL IMPRESSION:  Patient noting improvement in Lt knee pain and strength, but has increased Rt knee pain and soreness. Review of HEP went well with patient progressing in resistance on Lt knee and demonstrated understanding. PT educated and demonstrated patient on use of SPC-stand alone tip for pain management and community ambulation. Patient appears to understand evidence by demonstration. Patient will continue to benefit from skilled PT services to  improve endurance, strength, and pain.    OBJECTIVE IMPAIRMENTS: Abnormal gait, decreased activity tolerance, decreased balance, decreased knowledge of condition, decreased knowledge of use of DME, decreased mobility, difficulty walking, decreased ROM, decreased strength, increased edema, postural dysfunction, and pain.   ACTIVITY LIMITATIONS: carrying, lifting, bending, sitting, standing, squatting, sleeping, stairs, transfers, and locomotion level  PARTICIPATION LIMITATIONS: meal prep, cleaning, laundry, shopping, community activity, and occupation  PERSONAL FACTORS: Fitness, Past/current experiences, and 3+ comorbidities: see PMH are also affecting patient's functional outcome.   REHAB POTENTIAL: Good  CLINICAL DECISION MAKING: Stable/uncomplicated  EVALUATION COMPLEXITY: Moderate   GOALS:  Goals reviewed with patient? Yes   SHORT TERM GOALS: (target date for Short term goals 07/17/2024)    1.  Patient will demonstrate independent use of home exercise program to maintain progress from in clinic treatments. Goal status: MET 07/16/24  2. Left knee PROM extension -2* and flexion 105* Goal status: MET 07/16/24  LONG TERM GOALS: (target dates for all long term goals  09/04/2024)   1. Patient will demonstrate/report pain at worst less than or equal to 2/10 to facilitate minimal limitation in daily activity secondary to pain symptoms. Goal status: Ongoing 07/16/24   2. Patient will demonstrate independent use of home exercise program to facilitate ability to maintain/progress functional gains from skilled physical therapy services. Goal status: Ongoing 07/16/24   3. Patient will demonstrate Patient specific functional scale avg > or = 5 to indicate reduced disability due to condition.  Goal status: Ongoing 07/16/24   4.  Patient will demonstrate Lt knee LE MMT 5/5 throughout to faciltiate usual transfers, stairs, squatting at [facility] for daily life.  Goal status: Ongoing 07/16/24   5.   Patient ambulates > 500 ft including negotiating ramps and curbs without an assistive device independently. Goal status: Ongoing 07/16/24   6.  Patient negotiates stairs alternating patterning single rail modified independent. Goal status: Ongoing 07/16/24   7.  Left knee AROM extension 0* and flexion 110* Goal Status: Ongoing 07/16/24    PLAN:  PT FREQUENCY: 2x/week  PT DURATION: 10 weeks  PLANNED INTERVENTIONS: 97164- PT Re-evaluation, 97750- Physical Performance Testing, 97110-Therapeutic exercises, 97530- Therapeutic activity, [id number]- Neuromuscular re-education, 97535- Self Care, 02859- Manual therapy, [phone]- Gait training, [phone]- Electrical stimulation (unattended), [phone]- Electrical stimulation (manual), 97016- Vasopneumatic device, Patient/Family education, Balance training, Stair training, Taping, Joint mobilization, Scar mobilization, DME instructions, Cryotherapy, and Moist heat  PLAN FOR NEXT SESSION: update PSFS. Continue functional strengthening. Follow up on cane use in community and stationary bike accessibility outside of PT.  Sherline Babe, Student-PT 07/16/2024, 2:37 PM  This entire session of physical therapy was performed under the direct supervision of PT signing evaluation /treatment. PT reviewed note and agrees.   Grayce Spatz, PT, DPT 07/16/2024, 2:44 PM   "

## 2024-07-16 ENCOUNTER — Ambulatory Visit: Admitting: Physical Therapy

## 2024-07-16 ENCOUNTER — Encounter: Payer: Self-pay | Admitting: Physical Therapy

## 2024-07-16 DIAGNOSIS — M25562 Pain in left knee: Secondary | ICD-10-CM | POA: Diagnosis not present

## 2024-07-16 DIAGNOSIS — R2689 Other abnormalities of gait and mobility: Secondary | ICD-10-CM | POA: Diagnosis not present

## 2024-07-16 DIAGNOSIS — M25662 Stiffness of left knee, not elsewhere classified: Secondary | ICD-10-CM

## 2024-07-16 DIAGNOSIS — M6281 Muscle weakness (generalized): Secondary | ICD-10-CM | POA: Diagnosis not present

## 2024-07-16 DIAGNOSIS — R600 Localized edema: Secondary | ICD-10-CM

## 2024-07-17 ENCOUNTER — Other Ambulatory Visit: Payer: Self-pay | Admitting: Physician Assistant

## 2024-07-17 MED ORDER — HYDROCODONE-ACETAMINOPHEN 5-325 MG PO TABS: 1.0000 | ORAL_TABLET | Freq: Two times a day (BID) | ORAL | 0 refills | Status: AC | PRN

## 2024-07-20 ENCOUNTER — Encounter: Payer: Self-pay | Admitting: Physical Therapy

## 2024-07-20 ENCOUNTER — Other Ambulatory Visit: Payer: Self-pay | Admitting: Medical Genetics

## 2024-07-20 DIAGNOSIS — Z006 Encounter for examination for normal comparison and control in clinical research program: Secondary | ICD-10-CM

## 2024-07-20 NOTE — Addendum Note (Signed)
 Addended by: PENNSTROM, JORDYN on: 07/20/2024 03:30 PM   Modules accepted: Orders

## 2024-07-21 ENCOUNTER — Encounter: Admitting: Physician Assistant

## 2024-07-21 ENCOUNTER — Encounter: Admitting: Physical Therapy

## 2024-07-22 ENCOUNTER — Ambulatory Visit: Payer: Self-pay

## 2024-07-22 ENCOUNTER — Ambulatory Visit: Admitting: Physician Assistant

## 2024-07-22 ENCOUNTER — Ambulatory Visit: Admitting: Physical Therapy

## 2024-07-22 ENCOUNTER — Encounter: Payer: Self-pay | Admitting: Physical Therapy

## 2024-07-22 DIAGNOSIS — R2689 Other abnormalities of gait and mobility: Secondary | ICD-10-CM

## 2024-07-22 DIAGNOSIS — M6281 Muscle weakness (generalized): Secondary | ICD-10-CM

## 2024-07-22 DIAGNOSIS — M25662 Stiffness of left knee, not elsewhere classified: Secondary | ICD-10-CM | POA: Diagnosis not present

## 2024-07-22 DIAGNOSIS — Z96652 Presence of left artificial knee joint: Secondary | ICD-10-CM

## 2024-07-22 DIAGNOSIS — M1711 Unilateral primary osteoarthritis, right knee: Secondary | ICD-10-CM

## 2024-07-22 DIAGNOSIS — R600 Localized edema: Secondary | ICD-10-CM

## 2024-07-22 DIAGNOSIS — M25562 Pain in left knee: Secondary | ICD-10-CM | POA: Diagnosis not present

## 2024-07-22 MED ORDER — ACETAMINOPHEN-CODEINE 300-30 MG PO TABS: 1.0000 | ORAL_TABLET | Freq: Two times a day (BID) | ORAL | 0 refills | Status: AC | PRN

## 2024-07-22 NOTE — Progress Notes (Signed)
 "  Office Visit Note   Patient: [name] [name]           Date of Birth: 1981-02-13           MRN: [id number] Visit Date: 07/22/2024              Requested by: Jule Ronal CROME, PA-C [address] [place],  [place] [address] PCP: Loris Elsie PARAS, PA-C   Assessment & Plan: Visit Diagnoses:  1. Status post total left knee replacement   2. Unilateral primary osteoarthritis, right knee     Plan: Impression is 6 weeks status post left total knee replacement doing great.  She will continue with PT.  Follow-up in 6 weeks for recheck.  In regards to the right knee is advanced osteoarthritis and avascular necrosis.  previous cortisone injections provided temporary relief and she would like to proceed with right total knee replacement.  Risk, benefits and possible complications reviewed.  Rehab recovery time discussed.  All questions were answered.  Will need to wait till she is 3 months out from left total knee replacement before proceeding with surgery.  She understands and agrees.  Surgery sheet placed on Dr. Benjiman desk and we will have Marval give her a call to schedule no sooner than 09/06/2024.  Follow-Up Instructions: Return in about 6 weeks (around 09/02/2024).   Orders:  Orders Placed This Encounter  Procedures   XR Knee 1-2 Views Left   XR Knee 1-2 Views Right   No orders of the defined types were placed in this encounter.     Procedures: No procedures performed   Clinical Data: No additional findings.   Subjective: Chief Complaint  Patient presents with   Left Knee - Routine Post Op    06/08/2024-L TKA    HPI patient is a pleasant 44 year old female who comes in today 6 weeks status post left total knee replacement.  She has been doing great.  She has minimal to no pain in the left knee.  She has been in physical therapy making great progress.  She finished her baby aspirin  for which she was taking for DVT prophylaxis yesterday.  Main issue she brings up today is  worsening right knee pain.  History of advanced osteoarthritis as well as avascular necrosis.  She feels as though she now needs a cane to help ambulate secondary to the pain.  Of note, she underwent cortisone injection to the right knee on 05/12/2024.  Review of Systems as detailed in HPI.  All others are negative.   Objective: Vital Signs: There were no vitals taken for this visit.  Physical Exam well-developed well-nourished female no acute distress.  Alert and oriented x 3.  Ortho Exam left knee exam: Range of motion from 0 to 115 degrees.  Stable to valgus varus stress.  She is neurovascular tact distally.  Right knee exam unchanged.  Specialty Comments:  No specialty comments available.  Imaging: XR Knee 1-2 Views Right Result Date: 07/22/2024 X-rays demonstrate advanced tricompartmental degenerative changes primarily to the lateral compartment in addition to avascular necrosis.  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) 08/05/2019   Elevated blood pressure reading in office without diagnosis of hypertension 08/05/2019   Attention deficit hyperactivity disorder (ADHD) 06/22/2015   Past Medical History:  Diagnosis Date   ADHD (attention deficit hyperactivity disorder)    Arthritis    Depression    Ectopic pregnancy    History of kidney stones    Hypertension     Family History  Problem Relation Age of Onset   Hypertension Father    Heart disease Father    Healthy Mother     Past Surgical History:  Procedure Laterality Date   TOTAL HIP  ARTHROPLASTY Right 08/31/2019   Procedure: RIGHT TOTAL HIP ARTHROPLASTY ANTERIOR APPROACH;  Surgeon: Jerri Kay HERO, MD;  Location: [facility];  Service: Orthopedics;  Laterality: Right;   TOTAL HIP ARTHROPLASTY Left 11/04/2023   Procedure: ARTHROPLASTY, HIP, TOTAL, ANTERIOR APPROACH;  Surgeon: Jerri Kay HERO, MD;  Location: [facility];  Service: Orthopedics;  Laterality: Left;   TOTAL KNEE ARTHROPLASTY Left 06/08/2024   Procedure: ARTHROPLASTY, KNEE, TOTAL;  Surgeon: Jerri Kay HERO, MD;  Location: [facility];  Service: Orthopedics;  Laterality: Left;   WISDOM TOOTH EXTRACTION     Social History   Occupational History   Occupation: office manager    Comment: [employer]   Occupation: blogger    Comment: beer blog for [employer]  Tobacco Use   Smoking status: Former    Current packs/day: 0.00    Average packs/day: 1 pack/day for 23.0 years (23.0 ttl pk-yrs)    Types: Cigarettes    Start date: 05/28/1994    Quit date: 05/28/2017    Years since quitting: 7.1   Smokeless tobacco: Never  Vaping Use   Vaping status: Never Used  Substance and Sexual Activity   Alcohol use: Never    Comment: 2 drinks per day   Drug use: No   Sexual activity: Yes    Partners: Male    Birth control/protection: None        "

## 2024-07-22 NOTE — Therapy (Signed)
 " OUTPATIENT PHYSICAL THERAPY LOWER EXTREMITY TREATMENT   Patient Name: [name] [name] MRN: [id number] DOB:11/30/1980, 44 y.o., female Today's Date: 07/22/2024  END OF SESSION:  PT End of Session - 07/22/24 1529     Visit Number 6    Number of Visits 20    Date for Recertification  09/04/24    Authorization Type [employer] COMM PPO    Authorization Time Period AUTH NEEDED $100 COPAY  BCBS DED MET, OOP Individual Remaining $3,734.06    PT Start Time 1526    PT Stop Time 1619    PT Time Calculation (min) 53 min    Activity Tolerance Patient tolerated treatment well    Behavior During Therapy WFL for tasks assessed/performed               Past Medical History:  Diagnosis Date   ADHD (attention deficit hyperactivity disorder)    Arthritis    Depression    Ectopic pregnancy    History of kidney stones    Hypertension    Past Surgical History:  Procedure Laterality Date   TOTAL HIP ARTHROPLASTY Right 08/31/2019   Procedure: RIGHT TOTAL HIP ARTHROPLASTY ANTERIOR APPROACH;  Surgeon: Jerri Kay HERO, MD;  Location: [facility] OR;  Service: Orthopedics;  Laterality: Right;   TOTAL HIP ARTHROPLASTY Left 11/04/2023   Procedure: ARTHROPLASTY, HIP, TOTAL, ANTERIOR APPROACH;  Surgeon: Jerri Kay HERO, MD;  Location: [facility] OR;  Service: Orthopedics;  Laterality: Left;   TOTAL KNEE ARTHROPLASTY Left 06/08/2024   Procedure: ARTHROPLASTY, KNEE, TOTAL;  Surgeon: Jerri Kay HERO, MD;  Location: [facility] OR;  Service: Orthopedics;  Laterality: Left;   WISDOM TOOTH EXTRACTION     Patient Active Problem List   Diagnosis Date Noted   Status post total left knee replacement 06/08/2024   Primary osteoarthritis of left knee 04/21/2024   Chronic pain of both knees 01/07/2024   Status post total replacement of left hip 08/09/2020   Status post total replacement of right hip 08/31/2019   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) 08/05/2019   Elevated blood pressure reading in office  without diagnosis of hypertension 08/05/2019   Attention deficit hyperactivity disorder (ADHD) 06/22/2015    PCP: Loris Elsie PARAS, PA-C  REFERRING PROVIDER: Jule Ronal LITTIE DEVONNA  REFERRING DIAG: [phone] (ICD-10-CM) - Status post total left knee replacement   THERAPY DIAG:  Other abnormalities of gait and mobility  Acute pain of left knee  Stiffness of left knee, not elsewhere classified  Localized edema  Muscle weakness (generalized)  Rationale for Evaluation and Treatment: Rehabilitation  ONSET DATE: 06/08/2024 L TKA  SUBJECTIVE:   SUBJECTIVE STATEMENT:  She saw PA who x-ray of right knee which is bone on bone.  They are planning right TKA 90 days after left TKA which was 06/08/2024.  Using cane as PT advised has improved her mobility without right knee being as painful.     PERTINENT HISTORY: 06/08/2024 Lt TKA, 08/31/2019 Rt THA, 11/04/2023 Lt THA, Hypertension, Arthritis, Depression, ADHD  DIAGNOSTIC FINDINGS: X-ray 06/08/2024 s/p TKA Left knee arthroplasty in expected alignment. No periprosthetic lucency or fracture. Recent postsurgical change includes air and edema in the soft tissues and joint space  PAIN:   NPRS scale: in last week left knee 0-5/10 thinks it is hurting when favors heavily due to Rt knee pain.  Right knee lowest 4/10 - 7-8/10 Pain location: Pain in patella and anterior knee Pain description: Sharp pain  Aggravating factors: Standing for long  periods  Relieving factors: Sitting, OTC (Tylenol )   PRECAUTIONS: Knee  WEIGHT BEARING RESTRICTIONS: No  FALLS:  Has patient fallen in last 6 months? 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 10 represents able to perform at prior level. 0 1 2 3 4 5 6 7 8 9  10 (Date and Score)  Activity Eval  06/24/24  07/22/24  1. Ability to stand (ADL) 2   5  2. Walking 2  5   3. Stairs 1 5  4. Work 0 1  5.    Score 1.3 4   Total score = sum of the activity scores/number of activities Minimum detectable change (90%CI) for average score = 2 points Minimum detectable change (90%CI) for single activity score = 3 points  COGNITION: Overall cognitive status: WFL    SENSATION: WFL  EDEMA:  Circumferential:   RLE: above knee  46 cm  around knee 44.2 cm  below knee 39.9 cm LLE: above knee 51 cm  around knee 48.5 cm  below knee 40.7 cm  POSTURE:  weight shift right  PALPATION: Mild tenderness and swelling around Lt knee during palpation.   LOWER EXTREMITY ROM:   ROM Left eval Left 06/30/24 Left 07/16/24  Hip flexion     Hip extension     Hip abduction     Hip adduction     Hip internal rotation     Hip external rotation     Knee flexion Seated: A: 85* P: 92*  Supine: A: 89* P: 98*  Supine: A: 95* P: 101*  Seated P: 115*  Knee extension Seated  LAQ A: -25*  Supine: Quad set: -12* P: -7* Supine: A: -5* P: -2* Seated P:0*  Ankle dorsiflexion     Ankle plantarflexion     Ankle inversion     Ankle eversion      (Blank rows = not tested)  LOWER EXTREMITY MMT:  MMT Left eval  Hip flexion   Hip extension   Hip abduction   Hip adduction   Hip internal rotation   Hip external rotation   Knee flexion 3-/5  Knee extension 3-/5  Ankle dorsiflexion   Ankle plantarflexion   Ankle inversion   Ankle eversion    (Blank rows = not tested)  FUNCTIONAL TESTS:  18 inch chair transfer:  Requires use of arm  rest with minimal engagement of LLE  GAIT: Distance walked: 20 ft; comfortable 2.23 ft/s; fast 2.61 ft/s Assistive device utilized: Single point cane with heavy UE WB support Level of assistance: SBA needs cues for deviations Comments: Antalgic gait pattern and decreased stance duration LLE, left knee flexed in stance, with decreased flexion range in LLE during swing  TODAY'S TREATMENT   07/22/2024 TherEx SciFit Recumbent bike; seat 9 for 8 minutes   Therapeutic Activities: Leg Press BLEs 125# 15 reps 1 set;  marching for knee control in stance 112# 10 reps 2 sets;  single LE 50# 10 reps 2 sets.  Patient's pelvis is not level in standing or gait due to RLE in valgus position & LLE is proper alignment s/p TKA.  PT added a 3/8 lift to right shoe.  Pt had level pelvis in standing and gait with & without cane. Pt reports feels better. PT demo & verbal cues on using 24 bar stool for stationary standing activities like in kitchen. She can alternated bw modified stand using bar stool and full stand to build standing tolerance. PT demo & verbal cues on sit to/from stand technique. Pt verbalized and return demo understanding.   Standing green theraband kicks 10 reps abd, add, straight leg flex and ext on ea LE.  Supervision for safety and cues for technique.    Modalities Vaso bil knees with dual hose with elevation; 10 mins med compression 34*   TREATMENT   07/16/24 TherEx Recumbent bike; seat 3 lvl 3 for 8 minutes  PT educated patient about use of stationary bike outside of PT to aid in strengthening & muscle endurance with suggestion for 30 minutes of exercise broken down into 3 x 10 min riding sessions with rest in between over time decreasing amount of rest needed. Patient verbalized understanding. PROM measurement (See objective data)  TherAct Double Leg Press  2 x 10 112# Single Leg Press 2 x 10 43# performed bilaterally  Pt amb 120 ft using a SPC-stand alone tip and 120 ft using a SPC - standard tip with supervision (step-through gait pattern) to experience the difference between DME modifications for pain management and community ambulation. PT demonstration and verbal cues for patterning; patient used 2 point-gait pattern and preferred use of SPC-stand alone tip.  PT educated patient about using cane for pain management due to noted increase in Rt knee pain in household and community. Patient verbalized understanding and plans to purchase stand alone tip for cane at home. Pt negotiated curb with supervision using a SPC-stand alone tip. PT demonstration and verbal cueing for patterning required. Ascending the curb with Lt leg followed by cane and Rt Leg. Descending with Rt leg and cane; PT verbal cue to bring ball of Lt foot over edge for optimal knee flexion and transition into walking.  Pt negotiated ramp with supervision using a SPC-stand alone tip. PT demonstration and verbal cueing for smaller steps and posture.  Ascending the ramp using patterning done on flat surface and step-through gait pattern. PT cue for forward trunk lean. Descending the ramp PT cued for upright posture.  Sit to Stand with Arms Crossed; 1 set 5 reps noted improved equality of weight bearing BLEs Seated Hamstring Curl with Anchored Resistance; 1 set 10 reps green TB progress 2nd set  5 reps blue TB   Standing Terminal Knee Extension with Resistance; 1 set 10 reps green TB progress 2nd set 5 reps blue TB  PT reviewed HEP and progressed from green TB > blue TB given to patient. Patient demonstrated and verbalized understanding of HEP progression for strength.      TREATMENT  07/10/2024 TherEx:  UBE with bilat LE only seat 8 for 8 minutes  Seated LAQ with 3# weights on ankles performing bilat with reciprocal motion 1x20 each side   TherAct:  Bilat leg press 2x12  with 87#   Unilat leg press 2x12 with 37# performed bilaterally  Step up and over with 4 step 1x10 with each foot staying on step  Step up and over with 6 step 1x10 with Lt LE staying on step   Manual:  Seated knee flexion with IR/distraction with 10s holds into overpressure and PT performing knee extension PROM between; patient also using percussive device to quad  Vaso:  Lt knee elevated on wedge for 10 minutes with medium compression at 34deg     TREATMENT  07/08/2024 TherEx Recumbent bike; seat 6 level 3 8 min  Seated Hamstring Curl with Anchored Resistance; green 2 sets 10 reps.  Standing Terminal Knee Extension with Resistance; green 2 sets 10 reps  Progression: Standing TKE with resistance > single leg stance on Lt LE; green 2 sets 10 reps  Alternating LAQ and active knee flexion with contralateral LE opposing motion to facilitate better muscle contraction; 3lb ankle weight 2 sets 10 reps  Updated HEP and patient received HO and green theraband for home. Patient demonstrated and verbalized understanding.  TherAct Leg press machine; BLE 106 lbs; 1 set 10 reps Leg press machine; LLE 37 lbs; 2 set 10 reps Sit <> Stand from standard chair with arms crossed; 1 set 10 reps. PT verbal cue and demonstration to unlock knees during sitting and reach trunk forward over toes for improved transition.   Manual  Seated knee flexion and tibial IR with massage gun facilitation in quadriceps to decrease muscle guarding and increase range  Long sitting Lt. Knee extension; towel underneath pt. Lt. Ankle; PT add overpressure at knee with combined IR of the tibia and ER of the femur  Modalities Vaso Lt knee elevation; 10 mins med compression 34*    HOME EXERCISE PROGRAM: Access Code: [email or web address] URL: [email or web address] Date: 07/08/2024 Prepared by: Sherline Babe  Exercises - Ankle Alphabet in Elevation  - 2-4 x daily - 7 x weekly - 1 sets - 1 reps - supine quad set with towel roll  under ankle  - 2-4 x daily - 7 x weekly - 2-3 sets - 10 reps - 5 seconds hold - Supine Heel Slide with Strap  - 2-3 x daily - 7 x weekly - 2-3 sets - 10 reps - 5 seconds hold - Supine Straight Leg Raises  - 2-3 x daily - 7 x weekly - 2-3 sets - 10 reps - 5 seconds hold - Seated Knee Flexion Extension AROM   - 2-4 x daily - 7 x weekly - 2-3 sets - 10 reps - 5 seconds hold - Seated Long Arc Quad  - 2-4 x daily - 7 x weekly - 2-3 sets - 10 reps - 5 seconds hold - Seated Active Straight-Leg Raise  - 2-4 x daily - 7 x weekly - 2-3 sets - 10 reps - 5 seconds hold - Standing Terminal Knee Extension with Resistance  - 1-2 x daily - 7 x weekly - 2-3 sets - 10 reps - 3-5 hold - Seated Hamstring Curl with Anchored Resistance  - 1-2 x daily - 7 x weekly - 2-3 sets - 10 reps - 3-5 hold - Sit to Stand with Arms Crossed  - 1-2 x daily - 7 x weekly - 2-3 sets - 10 reps  ASSESSMENT:  CLINICAL IMPRESSION:  Patient appears to understand use of bar stool to facilitate standing without overuse of LEs.  She reports that 3/8 heel lift on RLE made standing & gait more comfortable.  Pt was challenged by standing theraband work and fatigued BLEs.   Patient will continue to benefit from skilled PT services to improve endurance, strength, and pain.    OBJECTIVE IMPAIRMENTS: Abnormal gait, decreased activity tolerance, decreased balance, decreased knowledge of condition, decreased knowledge of use of DME, decreased mobility, difficulty walking, decreased ROM, decreased strength, increased edema, postural dysfunction, and pain.   ACTIVITY LIMITATIONS: carrying, lifting, bending, sitting, standing, squatting, sleeping, stairs, transfers, and locomotion level  PARTICIPATION LIMITATIONS: meal prep, cleaning, laundry, shopping, community activity, and occupation  PERSONAL FACTORS: Fitness, Past/current experiences, and 3+ comorbidities: see PMH are also affecting patient's functional outcome.   REHAB POTENTIAL:  Good  CLINICAL DECISION MAKING: Stable/uncomplicated  EVALUATION COMPLEXITY: Moderate   GOALS:  Goals reviewed with patient? Yes   SHORT TERM GOALS: (target date for Short term goals 07/17/2024)    1.  Patient will demonstrate independent use of home exercise program to maintain progress from in clinic treatments. Goal status: MET 07/16/24  2. Left knee PROM extension -2* and flexion 105* Goal status: MET 07/16/24  LONG TERM GOALS: (target dates for all long term goals  09/04/2024)   1. Patient will demonstrate/report pain at worst less than or equal to 2/10 to facilitate minimal limitation in daily activity secondary to pain symptoms. Goal status: Ongoing  07/22/2024   2. Patient will demonstrate independent use of home exercise program to facilitate ability to maintain/progress functional gains from skilled physical therapy services. Goal status: Ongoing  07/22/2024   3. Patient will demonstrate Patient specific functional scale avg > or = 5 to indicate reduced disability due to condition.  Goal status: Ongoing  07/22/2024   4.  Patient will demonstrate Lt knee LE MMT 5/5 throughout to faciltiate usual transfers, stairs, squatting at [facility] for daily life.  Goal status: Ongoing  07/22/2024   5.  Patient ambulates > 500 ft including negotiating ramps and curbs without an assistive device independently. Goal status: Ongoing  07/22/2024   6.  Patient negotiates stairs alternating patterning single rail modified independent. Goal status: Ongoing  07/22/2024   7.  Left knee AROM extension 0* and flexion 110* Goal Status: Ongoing  07/22/2024    PLAN:  PT FREQUENCY: 2x/week  PT DURATION: 10 weeks  PLANNED INTERVENTIONS: 97164- PT Re-evaluation, 97750- Physical Performance Testing, 97110-Therapeutic exercises, 97530- Therapeutic activity, [id number]- Neuromuscular re-education, 97535- Self Care, [id number]- Manual therapy, [phone]- Gait training, [phone]- Electrical stimulation (unattended), [phone]  Electrical stimulation (manual), 97016- Vasopneumatic device, Patient/Family education, Balance training, Stair training, Taping, Joint mobilization, Scar mobilization, DME instructions, Cryotherapy, and Moist heat  PLAN FOR NEXT SESSION: check how heel lift in right shoe feels. Continue functional strengthening.   Auron Tadros, PT, DPT 07/22/2024, 4:15 PM   "

## 2024-07-23 ENCOUNTER — Encounter

## 2024-07-24 ENCOUNTER — Other Ambulatory Visit: Payer: Self-pay | Admitting: Physician Assistant

## 2024-07-28 ENCOUNTER — Encounter: Admitting: Physical Therapy

## 2024-07-30 ENCOUNTER — Encounter: Admitting: Physical Therapy

## 2024-08-03 ENCOUNTER — Encounter: Admitting: Physical Therapy

## 2024-08-04 ENCOUNTER — Encounter: Admitting: Physician Assistant

## 2024-08-05 ENCOUNTER — Encounter: Admitting: Physical Therapy

## 2024-08-10 ENCOUNTER — Encounter: Admitting: Physical Therapy

## 2024-08-12 ENCOUNTER — Encounter: Admitting: Physical Therapy

## 2024-09-30 ENCOUNTER — Other Ambulatory Visit (HOSPITAL_BASED_OUTPATIENT_CLINIC_OR_DEPARTMENT_OTHER)
